# Patient Record
Sex: Male | Born: 1965 | Race: Black or African American | Hispanic: No | Marital: Single | Smoking: Current every day smoker
Health system: Southern US, Community
[De-identification: ages and names within clinical notes are randomized; demographics above are authoritative.]

## PROBLEM LIST (undated history)

## (undated) DIAGNOSIS — B1920 Unspecified viral hepatitis C without hepatic coma: Secondary | ICD-10-CM

## (undated) DIAGNOSIS — F1020 Alcohol dependence, uncomplicated: Secondary | ICD-10-CM

## (undated) DIAGNOSIS — K219 Gastro-esophageal reflux disease without esophagitis: Secondary | ICD-10-CM

## (undated) DIAGNOSIS — K279 Peptic ulcer, site unspecified, unspecified as acute or chronic, without hemorrhage or perforation: Secondary | ICD-10-CM

## (undated) DIAGNOSIS — I10 Essential (primary) hypertension: Secondary | ICD-10-CM

## (undated) DIAGNOSIS — N3941 Urge incontinence: Secondary | ICD-10-CM

## (undated) DIAGNOSIS — F329 Major depressive disorder, single episode, unspecified: Secondary | ICD-10-CM

## (undated) DIAGNOSIS — R011 Cardiac murmur, unspecified: Secondary | ICD-10-CM

## (undated) DIAGNOSIS — F32A Depression, unspecified: Secondary | ICD-10-CM

## (undated) DIAGNOSIS — F1910 Other psychoactive substance abuse, uncomplicated: Secondary | ICD-10-CM

## (undated) DIAGNOSIS — F419 Anxiety disorder, unspecified: Secondary | ICD-10-CM

## (undated) HISTORY — DX: Alcohol dependence, uncomplicated: F10.20

## (undated) HISTORY — DX: Urge incontinence: N39.41

## (undated) HISTORY — PX: APPENDECTOMY: SHX54

## (undated) HISTORY — DX: Peptic ulcer, site unspecified, unspecified as acute or chronic, without hemorrhage or perforation: K27.9

## (undated) HISTORY — DX: Anxiety disorder, unspecified: F41.9

## (undated) HISTORY — DX: Depression, unspecified: F32.A

## (undated) HISTORY — DX: Gastro-esophageal reflux disease without esophagitis: K21.9

## (undated) HISTORY — DX: Major depressive disorder, single episode, unspecified: F32.9

## (undated) HISTORY — DX: Cardiac murmur, unspecified: R01.1

## (undated) HISTORY — DX: Other psychoactive substance abuse, uncomplicated: F19.10

## (undated) HISTORY — PX: INGUINAL HERNIA REPAIR: SUR1180

---

## 1998-07-10 ENCOUNTER — Inpatient Hospital Stay (HOSPITAL_COMMUNITY): Admission: EM | Admit: 1998-07-10 | Discharge: 1998-07-14 | Payer: Self-pay | Admitting: Emergency Medicine

## 1998-07-12 ENCOUNTER — Encounter: Payer: Self-pay | Admitting: Internal Medicine

## 2013-08-10 ENCOUNTER — Emergency Department (INDEPENDENT_AMBULATORY_CARE_PROVIDER_SITE_OTHER)
Admission: EM | Admit: 2013-08-10 | Discharge: 2013-08-10 | Disposition: A | Discharge status: Home / Self Care | Payer: No Typology Code available for payment source | Source: Home / Self Care | Attending: Emergency Medicine | Admitting: Emergency Medicine

## 2013-08-10 ENCOUNTER — Encounter (HOSPITAL_COMMUNITY): Payer: Self-pay | Admitting: Emergency Medicine

## 2013-08-10 DIAGNOSIS — R358 Other polyuria: Secondary | ICD-10-CM

## 2013-08-10 DIAGNOSIS — R3589 Other polyuria: Secondary | ICD-10-CM

## 2013-08-10 DIAGNOSIS — M2550 Pain in unspecified joint: Secondary | ICD-10-CM

## 2013-08-10 HISTORY — DX: Unspecified viral hepatitis C without hepatic coma: B19.20

## 2013-08-10 LAB — CBC
HCT: 43.4 % (ref 39.0–52.0)
Hemoglobin: 14.6 g/dL (ref 13.0–17.0)
MCH: 27.4 pg (ref 26.0–34.0)
MCHC: 33.6 g/dL (ref 30.0–36.0)
MCV: 81.4 fL (ref 78.0–100.0)
PLATELETS: 210 10*3/uL (ref 150–400)
RBC: 5.33 MIL/uL (ref 4.22–5.81)
RDW: 13.4 % (ref 11.5–15.5)
WBC: 6.7 10*3/uL (ref 4.0–10.5)

## 2013-08-10 LAB — BASIC METABOLIC PANEL
BUN: 14 mg/dL (ref 6–23)
CALCIUM: 9.3 mg/dL (ref 8.4–10.5)
CO2: 25 mEq/L (ref 19–32)
CREATININE: 1.01 mg/dL (ref 0.50–1.35)
Chloride: 104 mEq/L (ref 96–112)
GFR calc non Af Amer: 85 mL/min — ABNORMAL LOW (ref >90)
Glucose, Bld: 81 mg/dL (ref 70–99)
Potassium: 4.3 mEq/L (ref 3.7–5.3)
Sodium: 141 mEq/L (ref 137–147)

## 2013-08-10 LAB — POCT URINALYSIS DIP (DEVICE)
Bilirubin Urine: NEGATIVE
Glucose, UA: NEGATIVE mg/dL
HGB URINE DIPSTICK: NEGATIVE
Ketones, ur: NEGATIVE mg/dL
Leukocytes, UA: NEGATIVE
Nitrite: NEGATIVE
PROTEIN: NEGATIVE mg/dL
SPECIFIC GRAVITY, URINE: 1.015 (ref 1.005–1.030)
UROBILINOGEN UA: 2 mg/dL — AB (ref 0.0–1.0)
pH: 8.5 — ABNORMAL HIGH (ref 5.0–8.0)

## 2013-08-10 LAB — SEDIMENTATION RATE: Sed Rate: 9 mm/hr (ref 0–16)

## 2013-08-10 LAB — CK: Total CK: 1179 U/L — ABNORMAL HIGH (ref 7–232)

## 2013-08-10 NOTE — ED Provider Notes (Signed)
Medical screening examination/treatment/procedure(s) were performed by a resident physician and as supervising physician I was immediately available for consultation/collaboration.  Philipp Deputy, M.D.  Harden Mo, MD 08/10/13 2223

## 2013-08-10 NOTE — ED Provider Notes (Signed)
CSN: [id number]     Arrival date & time 08/10/13  1725 History   First MD Initiated Contact with Patient 08/10/13 1927     Chief Complaint  Patient presents with  . Generalized Body Aches   (Consider location/radiation/quality/duration/timing/severity/associated sxs/prior Treatment) HPI  Generalized joint aches. Several months duration. Worst in the knees and shoulders. Worse at night than in the mornings. Associated weakness of his thighs. No history of swelling or redness of the joints. No rashes. Accompanied by polyuria of several years duration. No dysuria or flank pain. No fevers, chills, nausea vomiting or weight loss.  Past Medical History  Diagnosis Date  . Hepatitis C    History reviewed. No pertinent past surgical history. No family history on file. History  Substance Use Topics  . Smoking status: Not on file  . Smokeless tobacco: Not on file  . Alcohol Use: Not on file    Review of Systems See history of present illness Allergies  Review of patient's allergies indicates no known allergies.  Home Medications   Prior to Admission medications   Medication Sig Start Date End Date Taking? Authorizing Provider  esomeprazole (NEXIUM) 40 MG capsule Take 40 mg by mouth daily at 12 noon.   Yes Historical Provider, MD  lurasidone (LATUDA) 40 MG TABS tablet Take 40 mg by mouth daily with breakfast.   Yes Historical Provider, MD   BP 127/68  Pulse 76  Temp(Src) 98.1 F (36.7 C) (Oral)  SpO2 100% Physical Exam Gen.: middle-aged male, non distressed on appearing, pleasant Muscle skeletal: no tenderness or swelling of his knees or shoulders bilaterally, normal range of motion; posturing of upper lumbar shortness Neurologic: 2+ symmetric deep tendon reflexes of patella and Achilles bilaterally Skin: dryness of left palm ED Course  Procedures (including critical care time) Labs Review Labs Reviewed  POCT URINALYSIS DIP (DEVICE) - Abnormal; Notable for the following:    pH  8.5 (*)    Urobilinogen, UA 2.0 (*)    All other components within normal limits  CBC  BASIC METABOLIC PANEL  CK  SEDIMENTATION RATE    Imaging Review No results found.   MDM   1. Polyarthralgia   2. Polyuria    Polyarthralgia - unclear etiology but story consistent with osteoarthritis, nonetheless will screen for CK and ESR to rule out polymyalgia rheumatica  Polyruria - family history of diabetes. Therefore we'll check his glucose.   Given instructions for follow up with community [facility] wellness clinic.     Angelica Ran, MD 08/10/13 [phone]

## 2013-08-10 NOTE — Discharge Instructions (Signed)
Mr. Ethan [name],   It was nice to meet you. We will let you know if any of the tests are abnormal. I would like you to schedule a visit with the community health and wellness clinic as soon as you can.   Take Aleve for the joint pain at this time.   Take Care,   Dr. Maricela Bo

## 2013-08-10 NOTE — ED Notes (Signed)
Pt c/o generalized BA and freq urination Denies hematuria, dysuria Hx of OAB; was taking oxybutynin; released from prison 10 months ago Alert w/no signs of acute distress.

## 2013-08-12 ENCOUNTER — Telehealth: Payer: Self-pay | Admitting: Family Medicine

## 2013-08-12 NOTE — Telephone Encounter (Signed)
I left a message for the patient stating that he has an abnormal lab that needs to be followed up on. I would like to schedule him for a visit with me at [facility]. I asked him to please call back at [phone].

## 2013-08-12 NOTE — ED Notes (Signed)
CK total 1179 H, sed rate 9.  4/29 Message to Dr. Maricela Bo.  4/30 He wrote that he called pt. and left a message.  He will try to gt pt. to f/u in the [facility] clinic. 08/12/2013

## 2013-08-16 ENCOUNTER — Encounter: Payer: Self-pay | Admitting: Family Medicine

## 2013-08-16 NOTE — Telephone Encounter (Signed)
I attempted to call the patient and his wife at the numbers listed in our system. This is the second time without reaching the patient, so I will mail a letter with the results to the patient and ask that he follow up in clinic.

## 2013-09-01 ENCOUNTER — Other Ambulatory Visit: Payer: Self-pay | Admitting: Family Medicine

## 2013-09-01 ENCOUNTER — Ambulatory Visit (INDEPENDENT_AMBULATORY_CARE_PROVIDER_SITE_OTHER): Payer: No Typology Code available for payment source | Admitting: Family Medicine

## 2013-09-01 ENCOUNTER — Encounter: Payer: Self-pay | Admitting: Family Medicine

## 2013-09-01 VITALS — BP 112/83 | HR 90 | Temp 98.0°F | Resp 20 | Ht 69.0 in | Wt 200.0 lb

## 2013-09-01 DIAGNOSIS — R5381 Other malaise: Secondary | ICD-10-CM

## 2013-09-01 DIAGNOSIS — R631 Polydipsia: Secondary | ICD-10-CM

## 2013-09-01 DIAGNOSIS — M2550 Pain in unspecified joint: Secondary | ICD-10-CM

## 2013-09-01 DIAGNOSIS — R632 Polyphagia: Secondary | ICD-10-CM

## 2013-09-01 DIAGNOSIS — M549 Dorsalgia, unspecified: Secondary | ICD-10-CM

## 2013-09-01 DIAGNOSIS — R5383 Other fatigue: Principal | ICD-10-CM

## 2013-09-01 DIAGNOSIS — Z8619 Personal history of other infectious and parasitic diseases: Secondary | ICD-10-CM | POA: Insufficient documentation

## 2013-09-01 DIAGNOSIS — R358 Other polyuria: Secondary | ICD-10-CM

## 2013-09-01 DIAGNOSIS — R3589 Other polyuria: Secondary | ICD-10-CM

## 2013-09-01 DIAGNOSIS — B1920 Unspecified viral hepatitis C without hepatic coma: Secondary | ICD-10-CM

## 2013-09-01 MED ORDER — MELOXICAM 7.5 MG PO TABS: 7.5000 mg | ORAL_TABLET | Freq: Every day | ORAL | Status: DC

## 2013-09-01 NOTE — Progress Notes (Signed)
   Subjective:    Patient ID: Ethan [name], male    DOB: Sep 01, 1965, 48 y.o.   MRN: [id number]  HPI Patient in office to establish care.  Patient complaining of generalized joint pain. Reports that pain is primarily in his upper extremities. Pain intensity is described as 2/10, intermittent, and aching. Patient reports that pain primarily occurs upon awakening. Pain typically improves as the day progresses.  Review of Systems  Constitutional: Positive for fatigue.  HENT: Positive for dental problem.   Eyes: Negative.   Musculoskeletal: Positive for joint swelling and myalgias.  Allergic/Immunologic: Negative.   Neurological: Positive for numbness (occasionally).  Psychiatric/Behavioral: The patient is nervous/anxious.        Objective:   Physical Exam  Constitutional: He appears well-developed and well-nourished. He appears lethargic.  HENT:  Head: Normocephalic.  Right Ear: Hearing, tympanic membrane and external ear normal.  Left Ear: Hearing and tympanic membrane normal.  Neck: Normal range of motion and full passive range of motion without pain. Neck supple.  Cardiovascular: Normal rate, regular rhythm, normal heart sounds and normal pulses.   Pulmonary/Chest: No apnea. No respiratory distress.  Abdominal: Soft. Normal appearance.  Musculoskeletal:       Right shoulder: Normal.  Lymphadenopathy:       Head (right side): No submental and no submandibular adenopathy present.       Head (left side): No submental and no submandibular adenopathy present.       Right cervical: No superficial cervical adenopathy present. Neurological: He appears lethargic.          Assessment & Plan:  1. Bipolar and depression: Patient is followed by [facility] and goes to monthly counseling. Next appt is on June 16th.  Patient reports that bipolar disease and depression are controlled on Latuda. Patient denies suicidal or homicidal ideations.   2. Polyarthragia: Will check an arthritis  panel. Will start Meloxicam 15 mg daily.   3. Hepatitis C: States that he went through 18 months of treatment. Patient was unable to recall the treatment he received. Will review medical records from [facility] as they become available  4. Numbness and tingling: Patient reports occasional numbness and tingling to upper extremities. Will check HbA1C.   5. Immunizations: Patient states that he is up to date on vaccinations. He received routine vaccinations at correctional facility   6. Colonoscopy: Patient reports that he has never had a colonoscopy. Will refer to GI 7. Fatigue: Will check HgbA1C 8. Polyuria: Continue Ditropan. Will check HbA1c.   RTC: 1 month Labs: Arthritis panel, HbA1C  Dorena Dew, FNP

## 2013-09-02 LAB — ARTHRITIS PANEL
Anti Nuclear Antibody(ANA): NEGATIVE
Rhuematoid fact SerPl-aCnc: <10 IU/mL (ref <=14)
Sed Rate: 1 mm/hr (ref 0–16)
Uric Acid, Serum: 7.6 mg/dL (ref 4.0–7.8)

## 2013-09-02 LAB — HEMOGLOBIN A1C
Hgb A1c MFr Bld: 6 % — ABNORMAL HIGH (ref <5.7)
MEAN PLASMA GLUCOSE: 126 mg/dL — AB (ref <117)

## 2013-10-04 ENCOUNTER — Ambulatory Visit: Payer: No Typology Code available for payment source | Admitting: Internal Medicine

## 2013-12-31 ENCOUNTER — Emergency Department (HOSPITAL_COMMUNITY): Payer: Self-pay

## 2013-12-31 ENCOUNTER — Encounter (HOSPITAL_COMMUNITY): Payer: Self-pay | Admitting: Emergency Medicine

## 2013-12-31 ENCOUNTER — Emergency Department (HOSPITAL_COMMUNITY)
Admission: EM | Admit: 2013-12-31 | Discharge: 2014-01-01 | Discharge status: Against Medical Advice | Payer: Self-pay | Attending: Emergency Medicine | Admitting: Emergency Medicine

## 2013-12-31 DIAGNOSIS — R079 Chest pain, unspecified: Secondary | ICD-10-CM | POA: Insufficient documentation

## 2013-12-31 DIAGNOSIS — I10 Essential (primary) hypertension: Secondary | ICD-10-CM | POA: Insufficient documentation

## 2013-12-31 HISTORY — DX: Essential (primary) hypertension: I10

## 2013-12-31 LAB — CBC
HCT: 43.8 % (ref 39.0–52.0)
HEMOGLOBIN: 14.7 g/dL (ref 13.0–17.0)
MCH: 27.7 pg (ref 26.0–34.0)
MCHC: 33.6 g/dL (ref 30.0–36.0)
MCV: 82.5 fL (ref 78.0–100.0)
Platelets: 196 10*3/uL (ref 150–400)
RBC: 5.31 MIL/uL (ref 4.22–5.81)
RDW: 13.6 % (ref 11.5–15.5)
WBC: 5.8 10*3/uL (ref 4.0–10.5)

## 2013-12-31 LAB — BASIC METABOLIC PANEL
Anion gap: 11 (ref 5–15)
BUN: 15 mg/dL (ref 6–23)
CHLORIDE: 106 meq/L (ref 96–112)
CO2: 25 mEq/L (ref 19–32)
Calcium: 9.2 mg/dL (ref 8.4–10.5)
Creatinine, Ser: 1.15 mg/dL (ref 0.50–1.35)
GFR calc non Af Amer: 73 mL/min — ABNORMAL LOW (ref >90)
GFR, EST AFRICAN AMERICAN: 84 mL/min — AB (ref >90)
GLUCOSE: 84 mg/dL (ref 70–99)
POTASSIUM: 4.5 meq/L (ref 3.7–5.3)
Sodium: 142 mEq/L (ref 137–147)

## 2013-12-31 LAB — I-STAT TROPONIN, ED: TROPONIN I, POC: 0 ng/mL (ref 0.00–0.08)

## 2013-12-31 NOTE — ED Notes (Signed)
Per pt sts tightness in chest more when moving and strenous activity. sts that he has been under a lot of stress lately. Pt sent here by EMS, EKG normal and vitals WNL. IV RAC

## 2013-12-31 NOTE — ED Notes (Signed)
Pt sts he wants to leave. Told pt we had a bed for him right this minute and still wanted to leave.

## 2014-11-11 ENCOUNTER — Emergency Department (HOSPITAL_COMMUNITY)
Admission: EM | Admit: 2014-11-11 | Discharge: 2014-11-11 | Disposition: A | Discharge status: Home / Self Care | Payer: Self-pay | Attending: Emergency Medicine | Admitting: Emergency Medicine

## 2014-11-11 ENCOUNTER — Encounter (HOSPITAL_COMMUNITY): Payer: Self-pay | Admitting: Emergency Medicine

## 2014-11-11 DIAGNOSIS — Z79899 Other long term (current) drug therapy: Secondary | ICD-10-CM | POA: Insufficient documentation

## 2014-11-11 DIAGNOSIS — Z8659 Personal history of other mental and behavioral disorders: Secondary | ICD-10-CM | POA: Insufficient documentation

## 2014-11-11 DIAGNOSIS — R443 Hallucinations, unspecified: Secondary | ICD-10-CM | POA: Insufficient documentation

## 2014-11-11 DIAGNOSIS — I10 Essential (primary) hypertension: Secondary | ICD-10-CM | POA: Insufficient documentation

## 2014-11-11 DIAGNOSIS — Z8619 Personal history of other infectious and parasitic diseases: Secondary | ICD-10-CM | POA: Insufficient documentation

## 2014-11-11 LAB — RAPID URINE DRUG SCREEN, HOSP PERFORMED
Amphetamines: NOT DETECTED
BARBITURATES: NOT DETECTED
Benzodiazepines: NOT DETECTED
Cocaine: NOT DETECTED
Opiates: NOT DETECTED
Tetrahydrocannabinol: NOT DETECTED

## 2014-11-11 LAB — SALICYLATE LEVEL

## 2014-11-11 LAB — COMPREHENSIVE METABOLIC PANEL
ALBUMIN: 3.6 g/dL (ref 3.5–5.0)
ALT: 26 U/L (ref 17–63)
AST: 27 U/L (ref 15–41)
Alkaline Phosphatase: 60 U/L (ref 38–126)
Anion gap: 5 (ref 5–15)
BILIRUBIN TOTAL: 0.5 mg/dL (ref 0.3–1.2)
BUN: 11 mg/dL (ref 6–20)
CO2: 28 mmol/L (ref 22–32)
CREATININE: 1.18 mg/dL (ref 0.61–1.24)
Calcium: 9 mg/dL (ref 8.9–10.3)
Chloride: 106 mmol/L (ref 101–111)
GFR calc non Af Amer: >60 mL/min (ref >60)
GLUCOSE: 100 mg/dL — AB (ref 65–99)
Potassium: 4.5 mmol/L (ref 3.5–5.1)
Sodium: 139 mmol/L (ref 135–145)
Total Protein: 6.8 g/dL (ref 6.5–8.1)

## 2014-11-11 LAB — TSH: TSH: 1.098 u[IU]/mL (ref 0.350–4.500)

## 2014-11-11 LAB — LIPID PANEL
CHOL/HDL RATIO: 2.4 ratio
CHOLESTEROL: 146 mg/dL (ref 0–200)
HDL: 60 mg/dL (ref >40)
LDL Cholesterol: 77 mg/dL (ref 0–99)
Triglycerides: 47 mg/dL (ref <150)
VLDL: 9 mg/dL (ref 0–40)

## 2014-11-11 LAB — CBC
HCT: 45.8 % (ref 39.0–52.0)
HEMOGLOBIN: 15 g/dL (ref 13.0–17.0)
MCH: 27.6 pg (ref 26.0–34.0)
MCHC: 32.8 g/dL (ref 30.0–36.0)
MCV: 84.3 fL (ref 78.0–100.0)
PLATELETS: 197 10*3/uL (ref 150–400)
RBC: 5.43 MIL/uL (ref 4.22–5.81)
RDW: 13.5 % (ref 11.5–15.5)
WBC: 4.3 10*3/uL (ref 4.0–10.5)

## 2014-11-11 LAB — ACETAMINOPHEN LEVEL: Acetaminophen (Tylenol), Serum: <10 ug/mL — ABNORMAL LOW (ref 10–30)

## 2014-11-11 LAB — ETHANOL

## 2014-11-11 NOTE — ED Provider Notes (Signed)
CSN: [id number]     Arrival date & time 11/11/14  0962 History   First MD Initiated Contact with Patient 11/11/14 [phone]     Chief Complaint  Patient presents with  . Medical Clearance     HPI  Patient presents from [facility] for medical evaluation. Patient is under involuntary commitment papers due to schizophrenia, PTSD, hallucination. Patient denies medical concern, substantial physical discomfort. Patient acknowledges history of depression, auditory hallucination. Patient denies medication changes. Patient states "I'll be all right".  He denies acute changes from baseline.     Past Medical History  Diagnosis Date  . Hepatitis C   . Hypertension    History reviewed. No pertinent past surgical history. History reviewed. No pertinent family history. History  Substance Use Topics  . Smoking status: Never Smoker   . Smokeless tobacco: Not on file  . Alcohol Use: No    Review of Systems  Constitutional: Negative for fever.  Respiratory: Negative for shortness of breath.   Cardiovascular: Negative for chest pain.  Gastrointestinal: Negative for nausea.  Genitourinary: Negative.   Musculoskeletal:       Negative aside from HPI  Skin: Negative for wound.  Allergic/Immunologic: Negative for immunocompromised state.  Neurological: Negative for weakness.  Psychiatric/Behavioral: Positive for hallucinations and dysphoric mood.      Allergies  Review of patient's allergies indicates no known allergies.  Home Medications   Prior to Admission medications   Medication Sig Start Date End Date Taking? Authorizing Provider  esomeprazole (NEXIUM) 40 MG capsule Take 40 mg by mouth daily at 12 noon.    Historical Provider, MD  oxybutynin (DITROPAN) 5 MG tablet Take 5 mg by mouth 2 (two) times daily.    Historical Provider, MD   BP 121/80 mmHg  Pulse 72  Temp(Src) 97.8 F (36.6 C) (Oral)  Resp 18  SpO2 100% Physical Exam  Constitutional: He is oriented to person, place, and  time. He appears well-developed. No distress.  HENT:  Head: Normocephalic and atraumatic.  Eyes: Conjunctivae and EOM are normal.  Cardiovascular: Normal rate and regular rhythm.   Pulmonary/Chest: Effort normal. No stridor. No respiratory distress.  Abdominal: He exhibits no distension.  Musculoskeletal: He exhibits no edema.  Neurological: He is alert and oriented to person, place, and time.  Skin: Skin is warm and dry.  Psychiatric: He has a normal mood and affect. His speech is normal. He is not agitated, not aggressive, not hyperactive and not combative. Cognition and memory are not impaired.  Not overtly delusional  Nursing note and vitals reviewed.   ED Course  Procedures (including critical care time) Labs Review Labs Reviewed  COMPREHENSIVE METABOLIC PANEL  ETHANOL  SALICYLATE LEVEL  ACETAMINOPHEN LEVEL  CBC  URINE RAPID DRUG SCREEN, HOSP PERFORMED  TSH  HEMOGLOBIN A1C  LIPID PANEL      MDM  Patient presents from our affiliated psychiatric facility for medical evaluation. Here patient is awake, alert, hemodynamically stable, moving all extremities appropriately, interacting pleasantly. Patient is medically cleared for further psychiatric evaluation.  Carmin Muskrat, MD 11/11/14 [phone]

## 2014-11-11 NOTE — ED Notes (Addendum)
Pt arrived from [place] under custody with a need for medical clearance.  Pt is under IVC paperwork.  Paperwork states that pt is diagnosed with schizophrenia PTSD, alcohol dependence and cocaine dependence.  Pt states to IVC committers that he has depression with auditory hallucinations commanding him to kill himself.  Pt does have a bed at [facility] and pending medical clearance is to return to [place].

## 2014-11-12 LAB — HEMOGLOBIN A1C
Hgb A1c MFr Bld: 5.9 % — ABNORMAL HIGH (ref 4.8–5.6)
MEAN PLASMA GLUCOSE: 123 mg/dL

## 2014-12-21 ENCOUNTER — Inpatient Hospital Stay
Admission: EM | Admit: 2014-12-21 | Discharge: 2014-12-27 | DRG: 885 | Disposition: A | Discharge status: Home / Self Care | Payer: No Typology Code available for payment source | Source: Other Acute Inpatient Hospital | Attending: Psychiatry | Admitting: Psychiatry

## 2014-12-21 DIAGNOSIS — R45851 Suicidal ideations: Secondary | ICD-10-CM | POA: Diagnosis present

## 2014-12-21 DIAGNOSIS — Z8711 Personal history of peptic ulcer disease: Secondary | ICD-10-CM

## 2014-12-21 DIAGNOSIS — Z590 Homelessness: Secondary | ICD-10-CM

## 2014-12-21 DIAGNOSIS — L02214 Cutaneous abscess of groin: Secondary | ICD-10-CM | POA: Diagnosis present

## 2014-12-21 DIAGNOSIS — F17210 Nicotine dependence, cigarettes, uncomplicated: Secondary | ICD-10-CM | POA: Diagnosis present

## 2014-12-21 DIAGNOSIS — F1220 Cannabis dependence, uncomplicated: Secondary | ICD-10-CM | POA: Diagnosis present

## 2014-12-21 DIAGNOSIS — F1490 Cocaine use, unspecified, uncomplicated: Secondary | ICD-10-CM | POA: Diagnosis present

## 2014-12-21 DIAGNOSIS — R443 Hallucinations, unspecified: Secondary | ICD-10-CM | POA: Diagnosis present

## 2014-12-21 DIAGNOSIS — B1920 Unspecified viral hepatitis C without hepatic coma: Secondary | ICD-10-CM | POA: Diagnosis present

## 2014-12-21 DIAGNOSIS — R011 Cardiac murmur, unspecified: Secondary | ICD-10-CM | POA: Diagnosis present

## 2014-12-21 DIAGNOSIS — N3281 Overactive bladder: Secondary | ICD-10-CM | POA: Diagnosis present

## 2014-12-21 DIAGNOSIS — K219 Gastro-esophageal reflux disease without esophagitis: Secondary | ICD-10-CM | POA: Diagnosis present

## 2014-12-21 DIAGNOSIS — R45850 Homicidal ideations: Secondary | ICD-10-CM | POA: Diagnosis present

## 2014-12-21 DIAGNOSIS — F602 Antisocial personality disorder: Secondary | ICD-10-CM | POA: Diagnosis present

## 2014-12-21 DIAGNOSIS — Z91410 Personal history of adult physical and sexual abuse: Secondary | ICD-10-CM | POA: Diagnosis not present

## 2014-12-21 DIAGNOSIS — F331 Major depressive disorder, recurrent, moderate: Principal | ICD-10-CM | POA: Diagnosis present

## 2014-12-21 DIAGNOSIS — Z8619 Personal history of other infectious and parasitic diseases: Secondary | ICD-10-CM | POA: Diagnosis present

## 2014-12-21 DIAGNOSIS — I10 Essential (primary) hypertension: Secondary | ICD-10-CM | POA: Diagnosis present

## 2014-12-21 DIAGNOSIS — G4700 Insomnia, unspecified: Secondary | ICD-10-CM | POA: Diagnosis present

## 2014-12-21 DIAGNOSIS — F1010 Alcohol abuse, uncomplicated: Secondary | ICD-10-CM | POA: Diagnosis present

## 2014-12-21 DIAGNOSIS — F259 Schizoaffective disorder, unspecified: Secondary | ICD-10-CM | POA: Diagnosis present

## 2014-12-21 DIAGNOSIS — N492 Inflammatory disorders of scrotum: Secondary | ICD-10-CM

## 2014-12-21 DIAGNOSIS — F17200 Nicotine dependence, unspecified, uncomplicated: Secondary | ICD-10-CM

## 2014-12-21 DIAGNOSIS — F1590 Other stimulant use, unspecified, uncomplicated: Secondary | ICD-10-CM

## 2014-12-21 MED ORDER — TRAZODONE HCL 100 MG PO TABS: 100.0000 mg | ORAL_TABLET | Freq: Every evening | ORAL | Status: DC | PRN

## 2014-12-21 MED ORDER — ACETAMINOPHEN 325 MG PO TABS: 650.0000 mg | ORAL_TABLET | Freq: Four times a day (QID) | ORAL | Status: DC | PRN

## 2014-12-21 MED ORDER — OLANZAPINE 10 MG PO TABS
5.0000 mg | ORAL_TABLET | Freq: Every day | ORAL | Status: DC
  Administered 2014-12-21: 5 mg via ORAL
  Filled 2014-12-21: qty 1

## 2014-12-21 MED ORDER — NICOTINE POLACRILEX 2 MG MT GUM: 2.0000 mg | CHEWING_GUM | OROMUCOSAL | Status: DC | PRN

## 2014-12-21 MED ORDER — MAGNESIUM HYDROXIDE 400 MG/5ML PO SUSP: 30.0000 mL | Freq: Every day | ORAL | Status: DC | PRN

## 2014-12-21 MED ORDER — DIVALPROEX SODIUM 500 MG PO DR TAB
500.0000 mg | DELAYED_RELEASE_TABLET | Freq: Every day | ORAL | Status: DC
  Administered 2014-12-21: 500 mg via ORAL
  Filled 2014-12-21: qty 1

## 2014-12-21 MED ORDER — PANTOPRAZOLE SODIUM 40 MG PO TBEC
40.0000 mg | DELAYED_RELEASE_TABLET | Freq: Every day | ORAL | Status: DC
  Administered 2014-12-21 – 2014-12-27 (×7): 40 mg via ORAL
  Filled 2014-12-21 (×7): qty 1

## 2014-12-21 MED ORDER — ALUM & MAG HYDROXIDE-SIMETH 200-200-20 MG/5ML PO SUSP: 30.0000 mL | ORAL | Status: DC | PRN

## 2014-12-22 DIAGNOSIS — F259 Schizoaffective disorder, unspecified: Secondary | ICD-10-CM

## 2014-12-22 DIAGNOSIS — F1590 Other stimulant use, unspecified, uncomplicated: Secondary | ICD-10-CM

## 2014-12-22 DIAGNOSIS — F602 Antisocial personality disorder: Secondary | ICD-10-CM

## 2014-12-22 DIAGNOSIS — F1220 Cannabis dependence, uncomplicated: Secondary | ICD-10-CM

## 2014-12-22 DIAGNOSIS — I10 Essential (primary) hypertension: Secondary | ICD-10-CM

## 2014-12-22 DIAGNOSIS — K219 Gastro-esophageal reflux disease without esophagitis: Secondary | ICD-10-CM

## 2014-12-22 DIAGNOSIS — F17200 Nicotine dependence, unspecified, uncomplicated: Secondary | ICD-10-CM

## 2014-12-22 DIAGNOSIS — F331 Major depressive disorder, recurrent, moderate: Principal | ICD-10-CM

## 2014-12-22 DIAGNOSIS — F1010 Alcohol abuse, uncomplicated: Secondary | ICD-10-CM

## 2014-12-22 LAB — COMPREHENSIVE METABOLIC PANEL
ALK PHOS: 64 U/L (ref 38–126)
ALT: 20 U/L (ref 17–63)
AST: 28 U/L (ref 15–41)
Albumin: 3.3 g/dL — ABNORMAL LOW (ref 3.5–5.0)
Anion gap: 8 (ref 5–15)
BUN: 11 mg/dL (ref 6–20)
CO2: 25 mmol/L (ref 22–32)
CREATININE: 1.25 mg/dL — AB (ref 0.61–1.24)
Calcium: 9.1 mg/dL (ref 8.9–10.3)
Chloride: 107 mmol/L (ref 101–111)
GFR calc Af Amer: >60 mL/min (ref >60)
Glucose, Bld: 82 mg/dL (ref 65–99)
Potassium: 4.5 mmol/L (ref 3.5–5.1)
Sodium: 140 mmol/L (ref 135–145)
Total Bilirubin: 0.3 mg/dL (ref 0.3–1.2)
Total Protein: 7.4 g/dL (ref 6.5–8.1)

## 2014-12-22 LAB — URINALYSIS COMPLETE WITH MICROSCOPIC (ARMC ONLY)
Bacteria, UA: NONE SEEN
Bilirubin Urine: NEGATIVE
Glucose, UA: NEGATIVE mg/dL
HGB URINE DIPSTICK: NEGATIVE
KETONES UR: NEGATIVE mg/dL
LEUKOCYTES UA: NEGATIVE
NITRITE: NEGATIVE
Protein, ur: NEGATIVE mg/dL
SPECIFIC GRAVITY, URINE: 1.011 (ref 1.005–1.030)
Squamous Epithelial / LPF: NONE SEEN
pH: 5 (ref 5.0–8.0)

## 2014-12-22 LAB — TSH: TSH: 0.927 u[IU]/mL (ref 0.350–4.500)

## 2014-12-22 LAB — LIPID PANEL
CHOLESTEROL: 137 mg/dL (ref 0–200)
HDL: 36 mg/dL — ABNORMAL LOW (ref >40)
LDL Cholesterol: 93 mg/dL (ref 0–99)
Total CHOL/HDL Ratio: 3.8 RATIO
Triglycerides: 38 mg/dL (ref <150)
VLDL: 8 mg/dL (ref 0–40)

## 2014-12-22 LAB — CBC
HCT: 45.4 % (ref 40.0–52.0)
HEMOGLOBIN: 14.8 g/dL (ref 13.0–18.0)
MCH: 27.1 pg (ref 26.0–34.0)
MCHC: 32.6 g/dL (ref 32.0–36.0)
MCV: 83 fL (ref 80.0–100.0)
PLATELETS: 293 10*3/uL (ref 150–440)
RBC: 5.47 MIL/uL (ref 4.40–5.90)
RDW: 13 % (ref 11.5–14.5)
WBC: 5.8 10*3/uL (ref 3.8–10.6)

## 2014-12-22 LAB — URINE DRUG SCREEN, QUALITATIVE (ARMC ONLY)
Amphetamines, Ur Screen: NOT DETECTED
BARBITURATES, UR SCREEN: NOT DETECTED
Benzodiazepine, Ur Scrn: NOT DETECTED
CANNABINOID 50 NG, UR ~~LOC~~: NOT DETECTED
Cocaine Metabolite,Ur ~~LOC~~: NOT DETECTED
MDMA (ECSTASY) UR SCREEN: NOT DETECTED
Methadone Scn, Ur: NOT DETECTED
Opiate, Ur Screen: NOT DETECTED
Phencyclidine (PCP) Ur S: NOT DETECTED
Tricyclic, Ur Screen: NOT DETECTED

## 2014-12-22 LAB — HEMOGLOBIN A1C: HEMOGLOBIN A1C: 6.1 % — AB (ref 4.0–6.0)

## 2014-12-22 LAB — ETHANOL: Alcohol, Ethyl (B): <5 mg/dL (ref <5)

## 2014-12-22 MED ORDER — HYDROXYZINE HCL 50 MG PO TABS
50.0000 mg | ORAL_TABLET | Freq: Four times a day (QID) | ORAL | Status: DC | PRN
  Administered 2014-12-25 – 2014-12-27 (×3): 50 mg via ORAL
  Filled 2014-12-22 (×3): qty 1

## 2014-12-22 MED ORDER — SERTRALINE HCL 25 MG PO TABS
25.0000 mg | ORAL_TABLET | Freq: Every day | ORAL | Status: DC
  Administered 2014-12-22 – 2014-12-25 (×4): 25 mg via ORAL
  Filled 2014-12-22 (×4): qty 1

## 2014-12-22 MED ORDER — NICOTINE 14 MG/24HR TD PT24
14.0000 mg | MEDICATED_PATCH | Freq: Every day | TRANSDERMAL | Status: DC
  Filled 2014-12-22 (×5): qty 1

## 2014-12-22 NOTE — BH Assessment (Addendum)
Assessment Note  Ethan [name] is an 49 y.o. male who presents to [facility] after being referred by [facility]. He is currently being followed by them on an outpatient basis. Patient is reporting SI and HI with no plan. Patient is also stating, he having an increase of A/H and they are commanding in nature. Patient is stating he cannot contract for safety and is a threat to himself.  Axis I: Substance Abuse and Schizophrenia Axis III:  Past Medical History  Diagnosis Date  . Hepatitis C   . Hypertension    Axis IV: economic problems, other psychosocial or environmental problems, problems related to social environment, problems with access to health care services and problems with primary support group  Past Medical History:  Past Medical History  Diagnosis Date  . Hepatitis C   . Hypertension     History reviewed. No pertinent past surgical history.  Family History: History reviewed. No pertinent family history.  Social History:  reports that he has been smoking Cigarettes.  He does not have any smokeless tobacco history on file. He reports that he uses illicit drugs ("Crack" cocaine). He reports that he does not drink alcohol.  Additional Social History:  Alcohol / Drug Use Pain Medications: None Reported Prescriptions: None Reported Over the Counter: None Reported History of alcohol / drug use?: Yes Longest period of sobriety (when/how long): Unknown Negative Consequences of Use: Personal relationships Withdrawal Symptoms:  (None Reported) Substance #1 Name of Substance 1: Cocaine Substance #2 Name of Substance 2: THC  CIWA: CIWA-Ar BP: 115/80 mmHg Pulse Rate: 69 COWS:    Allergies: No Known Allergies  Home Medications:  Medications Prior to Admission  Medication Sig Dispense Refill  . lurasidone (LATUDA) 40 MG TABS tablet Take 40 mg by mouth daily with breakfast.    . traZODone (DESYREL) 50 MG tablet Take 50 mg by mouth at bedtime.        OB/GYN Status:  No LMP for male patient.  General Assessment Data Location of Assessment: [facility] ED TTS Assessment: In system Is this a Tele or Face-to-Face Assessment?: Tele Assessment Is this an Initial Assessment or a Re-assessment for this encounter?: Initial Assessment Marital status: Single Maiden name: n/a Is patient pregnant?: No Pregnancy Status: No Living Arrangements: Spouse/significant other Can pt return to current living arrangement?: Yes Admission Status: Involuntary Is patient capable of signing voluntary admission?: No Referral Source: Other Consulting civil engineer) Insurance type: Environmental health practitioner Exam ([place]) Medical Exam completed: Yes  Crisis Care Plan Living Arrangements: Spouse/significant other Name of Psychiatrist: [facility] Name of Therapist: [facility] with [facility]  Education Status Is patient currently in school?: No Current Grade: n/a Highest grade of school patient has completed: Unknown Name of school: n/a Contact person: n/a  Risk to self with the past 6 months Suicidal Ideation: Yes-Currently Present Has patient been a risk to self within the past 6 months prior to admission? : Yes Suicidal Intent: Yes-Currently Present Has patient had any suicidal intent within the past 6 months prior to admission? : Yes Is patient at risk for suicide?: Yes Suicidal Plan?: No Has patient had any suicidal plan within the past 6 months prior to admission? : Yes Access to Means: Yes Specify Access to Suicidal Means: Access to medications What has been your use of drugs/alcohol within the last 12 months?: Cocaine & THC Previous Attempts/Gestures: Yes How many times?: 1 Other Self Harm Risks: None Reported Triggers for Past  Attempts: Hallucinations, Other (Comment) Intentional Self Injurious Behavior: None Family Suicide History: Unknown Recent stressful life event(s): Other (Comment), Conflict (Comment) (Off of  medications) Persecutory voices/beliefs?: Yes Depression: Yes Depression Symptoms: Feeling angry/irritable, Loss of interest in usual pleasures, Isolating, Feeling worthless/self pity Substance abuse history and/or treatment for substance abuse?: Yes Suicide prevention information given to non-admitted patients: Not applicable  Risk to Others within the past 6 months Homicidal Ideation: Yes-Currently Present Does patient have any lifetime risk of violence toward others beyond the six months prior to admission? : No Thoughts of Harm to Others: Yes-Currently Present Comment - Thoughts of Harm to Others: No specific plans Current Homicidal Intent: No Current Homicidal Plan: No Access to Homicidal Means: No Identified Victim: No specific person History of harm to others?: No Assessment of Violence: None Noted Violent Behavior Description: None Reported Does patient have access to weapons?: No Criminal Charges Pending?: No Does patient have a court date: No Is patient on probation?: No  Psychosis Hallucinations: Auditory, Visual Delusions: None noted  Mental Status Report Appearance/Hygiene: In scrubs Eye Contact: Unable to Assess Motor Activity: Unable to assess Speech: Logical/coherent Level of Consciousness: Alert Mood: Depressed, Anxious Affect: Anxious Anxiety Level: Minimal Thought Processes: Circumstantial (Per [facility] Report) Judgement: Impaired Orientation: Person, Place, Time, Situation, Appropriate for developmental age Obsessive Compulsive Thoughts/Behaviors: Minimal  Cognitive Functioning Concentration: Normal Memory: Recent Intact, Remote Intact IQ: Average Insight: Poor Impulse Control: Poor Appetite: Fair Weight Loss: 0 Weight Gain: 0 Sleep: Decreased Total Hours of Sleep: 6 Vegetative Symptoms: None  ADLScreening [facility] Assessment Services) Patient's cognitive ability adequate to safely complete daily activities?: Yes Patient able to express need for  assistance with ADLs?: Yes Independently performs ADLs?: Yes (appropriate for developmental age)  Prior Inpatient Therapy Prior Inpatient Therapy: Yes Prior Therapy Dates: Unknown Prior Therapy Facilty/Provider(s): [facility] Reason for Treatment: Schizophrenia  Prior Outpatient Therapy Prior Outpatient Therapy: Yes Prior Therapy Dates: Current Prior Therapy Facilty/Provider(s): [facility] Reason for Treatment: Schizophrenia Does patient have an ACCT team?: No Does patient have Intensive In-House Services?  : No Does patient have [facility] services? : Yes Does patient have P4CC services?: No  ADL Screening (condition at time of admission) Patient's cognitive ability adequate to safely complete daily activities?: Yes Is the patient deaf or have difficulty hearing?: No Does the patient have difficulty seeing, even when wearing glasses/contacts?: No Does the patient have difficulty concentrating, remembering, or making decisions?: No Patient able to express need for assistance with ADLs?: Yes Does the patient have difficulty dressing or bathing?: No Independently performs ADLs?: Yes (appropriate for developmental age) Does the patient have difficulty walking or climbing stairs?: No Weakness of Legs: None Weakness of Arms/Hands: None  Home Assistive Devices/Equipment Home Assistive Devices/Equipment: None  Therapy Consults (therapy consults require a physician order) PT Evaluation Needed: No OT Evalulation Needed: No SLP Evaluation Needed: No Abuse/Neglect Assessment (Assessment to be complete while patient is alone) Physical Abuse: Denies Verbal Abuse: Denies Sexual Abuse: Denies Exploitation of patient/patient's resources: Denies Self-Neglect: Denies Values / Beliefs Cultural Requests During Hospitalization: None Spiritual Requests During Hospitalization: None Consults Spiritual Care Consult Needed: No Social Work Consult Needed: No Regulatory affairs officer (For Healthcare) Does  patient have an advance directive?: No Would patient like information on creating an advanced directive?: No - patient declined information Nutrition Screen- [facility] Adult/WL/AP Patient's home diet: Cardiac Has the patient recently lost weight without trying?: No Has the patient been eating poorly because of a decreased appetite?: No Malnutrition Screening Tool Score: 0  Additional Information 1:1 In Past 12 Months?: No CIRT Risk: No Elopement Risk: No Does patient have medical clearance?: Yes  Child/Adolescent Assessment Running Away Risk: Denies (Patient is an adult)  Disposition:  Disposition Initial Assessment Completed for this Encounter: Yes Disposition of Patient: Inpatient treatment program Type of inpatient treatment program: Adult Other disposition(s): Referred to outside facility (Referred to [facility] from [facility])  On Site Evaluation by:   Reviewed with Physician:    Gunnar Fusi, MS, LCAS, LPC, [place], CCSI 12/22/2014 5:50 PM

## 2014-12-22 NOTE — BHH Group Notes (Signed)
[facility] LCSW Group Therapy  12/22/2014 4:20 PM  Type of Therapy:  Group Therapy  Participation Level:  Minimal  Participation Quality:  Attentive  Affect:  Flat  Cognitive:  Alert  Insight:  Limited   Engagement in Therapy:  Limited   Modes of Intervention:  Discussion, Education, Socialization and Support  Summary of Progress/Problems: Balance in life: Patients will discuss the concept of balance and how it looks and feels to be unbalanced. Pt will identify areas in their life that is unbalanced and ways to become more balanced.  Ethan [name] attended group and stayed the entire time. He sat quietly and listened to other group members.   [place] MSW, [place]  12/22/2014, 4:20 PM

## 2014-12-22 NOTE — BHH Group Notes (Signed)
[facility] Group Notes:  (Nursing/MHT/Case Management/Adjunct)  Date:  12/22/2014  Time:  2:21 PM  Type of Therapy:  Group Therapy  Participation Level:  Active  Participation Quality:  Appropriate  Affect:  Appropriate  Cognitive:  Appropriate  Insight:  Good  Engagement in Group:  Engaged  Modes of Intervention:  Support  Summary of Progress/Problems:  Nehemiah Settle 12/22/2014, 2:21 PM

## 2014-12-22 NOTE — Progress Notes (Signed)
D: Patient has appeared anxious today. He states that he has a hard time trusting people and is ambivalent about attending groups and talking to the psychiatrist because he feels he has been betrayed in the past. He rates depression as 6/10 and anxiety 5/10. Sleep is fair and energy level is low. States his goal today is to try to not feel like everyone is against him. He wants to find a place to live and stay off of drugs. Denies SI/HI/AVH. A: On q 15 minute checks. Given meds. Offered emotional support. R: Cooperative but guarded and anxious.
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
## 2014-12-22 NOTE — Progress Notes (Signed)
Patient admitted today for SI with no plan.  Patient is also positive for auditory hallucinations telling him to hurt himself and others.  Patient search performed.  No contraband found.

## 2014-12-22 NOTE — Progress Notes (Signed)
Recreation Therapy Notes  Date: 09.08.16 Time: 4:40 pm Location: Craft Room  Group Topic: Leisure Education  Goal Area(s) Addresses:  Patient will identify activities for each letter of the alphabet. Patient will verbalize ability to integrate positive leisure into life post d/c. Patient will verbalize ability to use leisure as a Technical sales engineer.  Behavioral Response: Attentive, Interactive  Intervention: Leisure Alphabet  Activity: Patients were given a Leisure Air traffic controller and instructed to write a healthy leisure activity for each letter of the alphabet.   Education: LRT educated patient on things that are needed to participate in leisure  Education Outcome: In group clarification offered  Clinical Observations/Feedback: Patient completed approximately 90% of worksheet. Patient contributed to group discussion by stating some healthy leisure activities he wrote down.  Leonette Monarch, LRT/CTRS 12/22/2014 4:52 PM
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
## 2014-12-22 NOTE — Progress Notes (Signed)
Recreation Therapy Notes  INPATIENT RECREATION THERAPY ASSESSMENT  Patient Details Name: SAMMUEL BLICK MRN: [id number] DOB: Oct 02, 1965 Today's Date: 12/22/2014  Patient Stressors: Family, Other (Comment) (Losing apartment)  Coping Skills:   Substance Abuse, Exercise, Art/Dance, Talking, Music, Sports  Personal Challenges: Anger, Communication, Concentration, Decision-Making, Expressing Yourself, Problem-Solving, Relationships, Self-Esteem/Confidence, Social Interaction, Stress Management, Substance Abuse, Time Management, Trusting Others  Leisure Interests (2+):  Individual - Other (Comment) (Work out and help others)  Futures trader Resources:  No  Community Resources:     Current Use:    If no, Barriers?:    Patient Strengths:  Encouraging, open-minded  Patient Identified Areas of Improvement:  self-esteem, drug problem  Current Recreation Participation:  Getting high  Patient Goal for Hospitalization:  To be drug and alcohol free, get self-esteem back, and find a place he can be secure in  [place] of Residence:  [place] of Residence:  [place]   Current SI (including self-harm):  No ("Been doing that (having suicidal thoughts) for years.")  Current HI:  No ("I go through that (thoughts of homicide) too.")  Consent to Intern Participation: N/A   Leonette Monarch, LRT/CTRS 12/22/2014, 5:34 PM

## 2014-12-22 NOTE — Tx Team (Signed)
Initial Interdisciplinary Treatment Plan   PATIENT STRESSORS: Financial difficulties Occupational concerns Substance abuse   PATIENT STRENGTHS: Ability for insight General fund of knowledge Motivation for treatment/growth   PROBLEM LIST: Problem List/Patient Goals Date to be addressed Date deferred Reason deferred Estimated date of resolution  Depression 12/22/14     Suicidal Ideation 12/22/14     Psychosis 12/22/14                                          DISCHARGE CRITERIA:  Improved stabilization in mood, thinking, and/or behavior  PRELIMINARY DISCHARGE PLAN: Outpatient therapy  PATIENT/FAMIILY INVOLVEMENT: This treatment plan has been presented to and reviewed with the patient, Ethan [name], and/or family member.  The patient and family have been given the opportunity to ask questions and make suggestions.  Nash Mantis [facility] 12/22/2014, 6:11 AM

## 2014-12-22 NOTE — [facility] Suicide Risk Assessment (Signed)
[facility] Admission Suicide Risk Assessment   Nursing information obtained from:  Patient Demographic factors:  Male, Low socioeconomic status, Living alone, Unemployed Current Mental Status:  NA Loss Factors:  Financial problems / change in socioeconomic status Historical Factors:  Prior suicide attempts, Family history of mental illness or substance abuse Risk Reduction Factors:  Religious beliefs about death Total Time spent with patient: 30 minutes Principal Problem: Major depressive disorder, recurrent episode, moderate with anxious distress Diagnosis:   Patient Active Problem List   Diagnosis Date Noted  . HTN (hypertension) [I10] 12/22/2014  . GERD (gastroesophageal reflux disease) [K21.9] 12/22/2014  . Tobacco use disorder [Z72.0] 12/22/2014  . 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 Cannabis use disorder, severe, dependence [F12.20] 12/22/2014  . Alcohol use disorder, mild, abuse [F10.10] 12/22/2014  . Major depressive disorder, recurrent episode, moderate with anxious distress [F33.1] 12/22/2014  . Hepatitis C (treated) [B19.20] 09/01/2013     Continued Clinical Symptoms:  Alcohol Use Disorder Identification Test Final Score (AUDIT): 5 The "Alcohol Use Disorders Identification Test", Guidelines for Use in Primary Care, Second Edition.  World Pharmacologist [facility]). Score between 0-7:  no or low risk or alcohol related problems. Score between 8-15:  moderate risk of alcohol related problems. Score between 16-19:  high risk of alcohol related problems. Score 20 or above:  warrants further diagnostic evaluation for alcohol dependence and treatment.   CLINICAL FACTORS:   Depression:   Aggression Comorbid alcohol abuse/dependence Impulsivity Alcohol/Substance Abuse/Dependencies Personality Disorders:   Cluster B Comorbid alcohol abuse/dependence Comorbid depression Previous Psychiatric Diagnoses and Treatments    Psychiatric Specialty Exam: Physical  Exam  ROS   COGNITIVE FEATURES THAT CONTRIBUTE TO RISK:  Closed-mindedness    SUICIDE RISK:   Mild:  Suicidal ideation of limited frequency, intensity, duration, and specificity.  There are no identifiable plans, no associated intent, mild dysphoria and related symptoms, good self-control (both objective and subjective assessment), few other risk factors, and identifiable protective factors, including available and accessible social support.  PLAN OF CARE: admit to [facility]:  Established Problem, Stable/Improving (1)  I certify that inpatient services furnished can reasonably be expected to improve the patient's condition.   Hildred Priest 12/22/2014, 10:58 AM

## 2014-12-23 DIAGNOSIS — N492 Inflammatory disorders of scrotum: Secondary | ICD-10-CM

## 2014-12-23 MED ORDER — CEPHALEXIN 500 MG PO CAPS
500.0000 mg | ORAL_CAPSULE | Freq: Four times a day (QID) | ORAL | Status: DC
  Administered 2014-12-23 – 2014-12-27 (×17): 500 mg via ORAL
  Filled 2014-12-23 (×17): qty 1

## 2014-12-23 NOTE — Progress Notes (Signed)
D: Patient denies SI/HI/AVH.  Patient affect and mood are depressed.  Patient interaction with staff is minimal.  Patient did attend evening group. Patient visible on the milieu. No distress noted. A: Support and encouragement offered. Scheduled medications given to pt. Q 15 min checks continued for patient safety. R: Patient receptive. Patient remains safe on the unit.

## 2014-12-23 NOTE — Progress Notes (Addendum)
[facility] MD Progress Note  12/23/2014 11:54 AM Ethan [name]  MRN:  [id number] Subjective:  Patient states that if he has nowhere to go at discharge he is just going to take some pills to go to sleep and never wake up. He tells me he has nowhere to go and no family.  He had not tried to overdose before because he does not want him put his family through that but feels very frustrated as he has nowhere to go to.  Patient is a registered sex offender (rape charges) per Education officer, museum he is not allowed to go to the homeless shelter.   Today the patient denies major issues with sleep, appetite. Mood appetite and concentrations are poor.  Denies side effects from medications. Denies having any physical complaints.  Denies HI or auditory or visual hallucinations.  Per nursing : D: Patient denies SI/HI/AVH. Patient affect and mood are depressed. Patient interaction with staff is minimal. Patient did attend evening group. Patient visible on the milieu. No distress noted. A: Support and encouragement offered. Scheduled medications given to pt. Q 15 min checks continued for patient safety. R: Patient receptive. Patient remains safe on the unit.   Principal Problem: Major depressive disorder, recurrent episode, moderate with anxious distress Diagnosis:   Patient Active Problem List   Diagnosis Date Noted  . Scrotal abscess [N49.2] 12/23/2014  . HTN (hypertension) [I10] 12/22/2014  . GERD (gastroesophageal reflux disease) [K21.9] 12/22/2014  . Tobacco use disorder [Z72.0] 12/22/2014  . 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 Cannabis use disorder, severe, dependence [F12.20] 12/22/2014  . Alcohol use disorder, mild, abuse [F10.10] 12/22/2014  . Major depressive disorder, recurrent episode, moderate with anxious distress [F33.1] 12/22/2014  . Antisocial personality disorder [F60.2] 12/22/2014  . Hepatitis C (treated) [B19.20] 09/01/2013   Total Time spent with patient: 30 minutes   Past  Medical History:  Past Medical History  Diagnosis Date  . Hepatitis C   . Hypertension    History reviewed. No pertinent past surgical history. Family History: History reviewed. No pertinent family history. Social History:  History  Alcohol Use No     History  Drug Use  . Yes  . Special: "Crack" cocaine    Social History   Social History  . Marital Status: Married    Spouse Name: N/A  . Number of Children: N/A  . Years of Education: N/A   Social History Main Topics  . Smoking status: Current Every Day Smoker    Types: Cigarettes  . Smokeless tobacco: None  . Alcohol Use: No  . Drug Use: Yes    Special: "Crack" cocaine  . Sexual Activity:    Partners: Female     Comment: WIFE   Other Topics Concern  . None   Social History Narrative   Additional History:    Sleep: Good  Appetite:  Fair   Assessment:   Musculoskeletal: Strength & Muscle Tone: within normal limits Gait & Station: normal Patient leans: N/A   Psychiatric Specialty Exam: Physical Exam  Review of Systems  Constitutional: Negative.   HENT: Negative.   Eyes: Negative.   Respiratory: Negative.   Cardiovascular: Negative.   Gastrointestinal: Negative.   Genitourinary: Negative.   Musculoskeletal: Negative.   Skin: Negative.   Neurological: Negative.   Endo/Heme/Allergies: Negative.   Psychiatric/Behavioral: Positive for depression, suicidal ideas and substance abuse.    Blood pressure 112/77, pulse 70, temperature 98.6 F (37 C), temperature source Oral, resp. rate 20, height 5' 9"  (  1.753 m), weight 83.462 kg (184 lb), SpO2 99 %.Body mass index is 27.16 kg/(m^2).  General Appearance: Well Groomed  Engineer, water::  Good  Speech:  Normal Rate  Volume:  Normal  Mood:  Irritable  Affect:  Constricted  Thought Process:  Linear  Orientation:  Full (Time, Place, and Person)  Thought Content:  Hallucinations: None  Suicidal Thoughts:  No  Homicidal Thoughts:  No  Memory:  Immediate;    Good Recent;   Good Remote;   Good  Judgement:  Poor  Insight:  Shallow  Psychomotor Activity:  Normal  Concentration:  NA  Recall:  NA  Fund of Knowledge:Good  Language: Good  Akathisia:  No  Handed:    AIMS (if indicated):     Assets:  Armed forces logistics/support/administrative officer Physical Health  ADL's:  Intact  Cognition: WNL  Sleep:  Number of Hours: 7.25     Current Medications: Current Facility-Administered Medications  Medication Dose Route Frequency Provider Last Rate Last Dose  . acetaminophen (TYLENOL) tablet 650 mg  650 mg Oral Q6H PRN Hildred Priest, MD      . alum & mag hydroxide-simeth (MAALOX/MYLANTA) 200-200-20 MG/5ML suspension 30 mL  30 mL Oral Q4H PRN Hildred Priest, MD      . cephALEXin (KEFLEX) capsule 500 mg  500 mg Oral 4 times per day Sherri Rad, MD   500 mg at 12/23/14 1144  . hydrOXYzine (ATARAX/VISTARIL) tablet 50 mg  50 mg Oral Q6H PRN Hildred Priest, MD      . magnesium hydroxide (MILK OF MAGNESIA) suspension 30 mL  30 mL Oral Daily PRN Hildred Priest, MD      . nicotine (NICODERM CQ - dosed in mg/24 hours) patch 14 mg  14 mg Transdermal Daily Hildred Priest, MD   14 mg at 12/22/14 1456  . pantoprazole (PROTONIX) EC tablet 40 mg  40 mg Oral Daily Hildred Priest, MD   40 mg at 12/23/14 0912  . sertraline (ZOLOFT) tablet 25 mg  25 mg Oral Daily Hildred Priest, MD   25 mg at 12/23/14 0912  . traZODone (DESYREL) tablet 100 mg  100 mg Oral QHS PRN Hildred Priest, MD        Lab Results:  Results for orders placed or performed during the hospital encounter of 12/21/14 (from the past 48 hour(s))  CBC     Status: None   Collection Time: 12/22/14  6:01 AM  Result Value Ref Range   WBC 5.8 3.8 - 10.6 K/uL   RBC 5.47 4.40 - 5.90 MIL/uL   Hemoglobin 14.8 13.0 - 18.0 g/dL   HCT 45.4 40.0 - 52.0 %   MCV 83.0 80.0 - 100.0 fL   MCH 27.1 26.0 - 34.0 pg   MCHC 32.6 32.0 - 36.0 g/dL   RDW 13.0 11.5  - 14.5 %   Platelets 293 150 - 440 K/uL  Comprehensive metabolic panel     Status: Abnormal   Collection Time: 12/22/14  6:01 AM  Result Value Ref Range   Sodium 140 135 - 145 mmol/L   Potassium 4.5 3.5 - 5.1 mmol/L   Chloride 107 101 - 111 mmol/L   CO2 25 22 - 32 mmol/L   Glucose, Bld 82 65 - 99 mg/dL   BUN 11 6 - 20 mg/dL   Creatinine, Ser 1.25 (H) 0.61 - 1.24 mg/dL   Calcium 9.1 8.9 - 10.3 mg/dL   Total Protein 7.4 6.5 - 8.1 g/dL   Albumin 3.3 (L) 3.5 - 5.0  g/dL   AST 28 15 - 41 U/L   ALT 20 17 - 63 U/L   Alkaline Phosphatase 64 38 - 126 U/L   Total Bilirubin 0.3 0.3 - 1.2 mg/dL   GFR calc non Af Amer >60 >60 mL/min   GFR calc Af Amer >60 >60 mL/min    Comment: (NOTE) The eGFR has been calculated using the CKD EPI equation. This calculation has not been validated in all clinical situations. eGFR's persistently <60 mL/min signify possible Chronic Kidney Disease.    Anion gap 8 5 - 15  Hemoglobin A1c     Status: Abnormal   Collection Time: 12/22/14  6:01 AM  Result Value Ref Range   Hgb A1c MFr Bld 6.1 (H) 4.0 - 6.0 %  Ethanol     Status: None   Collection Time: 12/22/14  6:01 AM  Result Value Ref Range   Alcohol, Ethyl (B) <5 <5 mg/dL    Comment:        LOWEST DETECTABLE LIMIT FOR SERUM ALCOHOL IS 5 mg/dL FOR MEDICAL PURPOSES ONLY   Lipid panel, fasting     Status: Abnormal   Collection Time: 12/22/14  6:01 AM  Result Value Ref Range   Cholesterol 137 0 - 200 mg/dL   Triglycerides 38 <150 mg/dL   HDL 36 (L) >40 mg/dL   Total CHOL/HDL Ratio 3.8 RATIO   VLDL 8 0 - 40 mg/dL   LDL Cholesterol 93 0 - 99 mg/dL    Comment:        Total Cholesterol/HDL:CHD Risk Coronary Heart Disease Risk Table                     Men   Women  1/2 Average Risk   3.4   3.3  Average Risk       5.0   4.4  2 X Average Risk   9.6   7.1  3 X Average Risk  23.4   11.0        Use the calculated Patient Ratio above and the CHD Risk Table to determine the patient's CHD Risk.        ATP  III CLASSIFICATION (LDL):  <100     mg/dL   Optimal  100-129  mg/dL   Near or Above                    Optimal  130-159  mg/dL   Borderline  160-189  mg/dL   High  >190     mg/dL   Very High   TSH     Status: None   Collection Time: 12/22/14  6:01 AM  Result Value Ref Range   TSH 0.927 0.350 - 4.500 uIU/mL  Urinalysis complete, with microscopic ([facility] only)     Status: Abnormal   Collection Time: 12/22/14  3:00 PM  Result Value Ref Range   Color, Urine YELLOW (A) YELLOW   APPearance CLEAR (A) CLEAR   Glucose, UA NEGATIVE NEGATIVE mg/dL   Bilirubin Urine NEGATIVE NEGATIVE   Ketones, ur NEGATIVE NEGATIVE mg/dL   Specific Gravity, Urine 1.011 1.005 - 1.030   Hgb urine dipstick NEGATIVE NEGATIVE   pH 5.0 5.0 - 8.0   Protein, ur NEGATIVE NEGATIVE mg/dL   Nitrite NEGATIVE NEGATIVE   Leukocytes, UA NEGATIVE NEGATIVE   RBC / HPF 0-5 0 - 5 RBC/hpf   WBC, UA 0-5 0 - 5 WBC/hpf   Bacteria, UA NONE SEEN NONE SEEN   Squamous  Epithelial / LPF NONE SEEN NONE SEEN  Urine Drug Screen, Qualitative ([facility] only)     Status: None   Collection Time: 12/22/14  3:00 PM  Result Value Ref Range   Tricyclic, Ur Screen NONE DETECTED NONE DETECTED   Amphetamines, Ur Screen NONE DETECTED NONE DETECTED   MDMA (Ecstasy)Ur Screen NONE DETECTED NONE DETECTED   Cocaine Metabolite,Ur [place] NONE DETECTED NONE DETECTED   Opiate, Ur Screen NONE DETECTED NONE DETECTED   Phencyclidine (PCP) Ur S NONE DETECTED NONE DETECTED   Cannabinoid 50 Ng, Ur [place] NONE DETECTED NONE DETECTED   Barbiturates, Ur Screen NONE DETECTED NONE DETECTED   Benzodiazepine, Ur Scrn NONE DETECTED NONE DETECTED   Methadone Scn, Ur NONE DETECTED NONE DETECTED    Comment: (NOTE) 209  Tricyclics, urine               Cutoff 1000 ng/mL 200  Amphetamines, urine             Cutoff 1000 ng/mL 300  MDMA (Ecstasy), urine           Cutoff 500 ng/mL 400  Cocaine Metabolite, urine       Cutoff 300 ng/mL 500  Opiate, urine                   Cutoff 300  ng/mL 600  Phencyclidine (PCP), urine      Cutoff 25 ng/mL 700  Cannabinoid, urine              Cutoff 50 ng/mL 800  Barbiturates, urine             Cutoff 200 ng/mL 900  Benzodiazepine, urine           Cutoff 200 ng/mL 1000 Methadone, urine                Cutoff 300 ng/mL 1100 1200 The urine drug screen provides only a preliminary, unconfirmed 1300 analytical test result and should not be used for non-medical 1400 purposes. Clinical consideration and professional judgment should 1500 be applied to any positive drug screen result due to possible 1600 interfering substances. A more specific alternate chemical method 1700 must be used in order to obtain a confirmed analytical result.  1800 Gas chromato graphy / mass spectrometry (GC/MS) is the preferred 1900 confirmatory method.     Physical Findings: AIMS: Facial and Oral Movements Muscles of Facial Expression: None, normal Lips and Perioral Area: None, normal Jaw: None, normal Tongue: None, normal,Extremity Movements Upper (arms, wrists, hands, fingers): None, normal Lower (legs, knees, ankles, toes): None, normal, Trunk Movements Neck, shoulders, hips: None, normal, Overall Severity Severity of abnormal movements (highest score from questions above): None, normal Incapacitation due to abnormal movements: None, normal Patient's awareness of abnormal movements (rate only patient's report): No Awareness, Dental Status Current problems with teeth and/or dentures?: No Does patient usually wear dentures?: No  CIWA:    COWS:     Treatment Plan Summary: Daily contact with patient to assess and evaluate symptoms and progress in treatment and Medication management   48 year old African-American male with history of antisocial behavior, substance abuse, aggression and currently endorsing symptoms of depression as a result of losing his job and apartment. The patient feels people are not supportive to him and he feels everybody is against  him. Patient was quite defensive during assessment does not appear to have any insight into his aggressive behavior and justifies his actions. No evidence of psychosis, mania or hypomania.  Major depressive disorder:  Patient has been restarted on sertraline 25 mg by mouth daily in order to address irritability, anger and depressive symptomatology.  Cocaine and cannabis alcohol abuse: Patient will benefit from substance abuse treatment at discharge.  Insomnia: Patient will be started on trazodone 100 mg by mouth daily at bedtime when necessary.  Tobacco use disorder: We will offer nicotine patch of 21 mg  Hypertension: Blood pressure currently is stable without antihypertensives.  GERD: Patient will be started on Protonix 40 mg by mouth daily  Inguinal abscess: started on keflex by surgery.  Examination of his left hemiscrotum demonstrates a 2 cm x 3 cm area of induration but no evidence of cellulitis or purulent drainage. Per general surgery: Assessment: Resolving left hemiscrotal abscess I suspect this is a sebaceous cyst which will ultimately need to be excised in the near future.  Plan  Warm compress. Keflex was written. Have patient follow-up in our offices discharge.  Labs: TSH was within the normal limits. Hemoglobin A1c is pending. Lipid panel is within the normal limits, only showing a slightly decreased HDL. Appears to have chronic renal insufficiency. CBC within normal limits. All electrolytes were within the normal limits. UA and urine toxicology are still pending.  Discharge planning: Social worker is looking into options for disposition. His options are limited as he is a registered sex offender. Social worker has contacted [facility] and [facility].  Medical Decision Making:  Established Problem, Stable/Improving (1)     Hildred Priest 12/23/2014, 11:54 AM

## 2014-12-23 NOTE — [facility] Group Notes (Signed)
[place] LCSW Group Therapy  12/23/2014 2:43 PM  Type of Therapy:  Group Therapy  Participation Level:  Active  Participation Quality:  Appropriate and Attentive  Affect:  Appropriate  Cognitive:  Alert, Appropriate and Oriented  Insight:  Engaged  Engagement in Therapy:  Engaged  Modes of Intervention:  Socialization and Support  Summary of Progress/Problems: Patient was attentive and appropriately sharing during introductions that his 3 things that he would take on a deserted [place] are "a house, boat, and Bible". Patient was able to related to feelings around relapse such as anger, hurt, and disappointment.   Keene Breath, MSW, LCSWA 12/23/2014, 2:43 PM

## 2014-12-23 NOTE — Plan of Care (Signed)
Problem: [facility] Participation in Recreation Therapeutic Interventions Goal: STG-Patient will demonstrate improved self esteem by identif STG: Self-Esteem - Within 3 treatment sessions, patient will verbalize at least 5 positive affirmation statements in one treatment session to increase self-esteem post d/c.  Outcome: Completed/Met Date Met:  12/23/14 Treatment Session 1; Completed 1 out of 1; At approximately 12:05 pm, LRT met with patient in craft room. Patient verbalized 5 positive affirmation statements. Patient reported it felt "positive". LRT encouraged patient to continue saying positive affirmation statements.  Leonette Monarch, LRT/CTRS 09.09.16 2:16 pm Goal: STG-Patient will identify at least five coping skills for ** STG: Coping Skills - Within 3 treatment sessions, patient will verbalize at least 5 coping skills for substance abuse in one treatment session to decrease substance abuse post d/c.  Outcome: Completed/Met Date Met:  12/23/14 Treatment Session 1; Completed 1 out of 1: At approximately 12:05 pm, LRT met with patient in craft room. Patient verbalized 5 coping skills for substance abuse. LRT educated patient on leisure and why it is important to implement into his schedule. LRT provided patient with blank schedules to help him plan his day and try to avoid using substances. LRT educated patient on healthy support systems.  Leonette Monarch, LRT/CTRS 09.09.16 2:18 pm Goal: STG-Other Recreation Therapy Goal (Specify) STG: Stress Management - Within 3 treatment sessions, patient will verbalize understanding of the stress management techniques in one treatment session to increase stress management skills post d/c.  Outcome: Completed/Met Date Met:  12/23/14 Treatment Session 1; Completed 1 out of 1: At approximately 12:05 pm, LRT met with patient in craft room. LRT educated and provided patient with handouts on stress management techniques. Patient verbalized understanding. LRT  encouraged patient to read over and practice the stress management techniques.  Leonette Monarch, LRT/CTRS 09.09.16 2:20 pm

## 2014-12-23 NOTE — Progress Notes (Signed)
Recreation Therapy Notes  Date: 09.09.16 Time: 3:00 pm Location: Craft Room  Group Topic: Coping Skills  Goal Area(s) Addresses:  Patient will participate in coping skill. Patient will verbalize benefit of using art as a coping skill.  Behavioral Response: Attentive  Intervention: Coloring  Activity: Patients were given coloring sheets and instructed to color and think about the emotions they experienced.  Education: LRT educated patients on healthy coping skills.  Education Outcome: In group clarification offered  Clinical Observations/Feedback: Patient colored worksheet. Patient did not contribute to group discussion.  Leonette Monarch, LRT/CTRS 12/23/2014 4:29 PM

## 2014-12-23 NOTE — Plan of Care (Signed)
Problem: Alteration in mood Goal: LTG-Patient reports reduction in suicidal thoughts (Patient reports reduction in suicidal thoughts and is able to verbalize a safety plan for whenever patient is feeling suicidal)  Outcome: Progressing Denies SI at present

## 2014-12-23 NOTE — Consult Note (Signed)
Patient ID: Ethan [name], male   DOB: 06-Sep-1965, 49 y.o.   MRN: [id number]  No chief complaint on file.   HPI  Ethan [name] is a 49 y.o. male. Admitted to the psychiatric unit with a depressive episode. He's had approximately 1 week history of left hemiscrotal swelling. He's had intermittent drainage. Currently without pain. Dr. Jerilee Hoh asked me to evaluate. The patient states that he's had multiple areas of subcutaneous abscess formation in the past.  Past Medical History  Diagnosis Date  . Hepatitis C   . Hypertension     History reviewed. No pertinent past surgical history.  History reviewed. No pertinent family history.  Social History Social History  Substance Use Topics  . Smoking status: Current Every Day Smoker    Types: Cigarettes  . Smokeless tobacco: None  . Alcohol Use: No    No Known Allergies  Current Facility-Administered Medications  Medication Dose Route Frequency Provider Last Rate Last Dose  . acetaminophen (TYLENOL) tablet 650 mg  650 mg Oral Q6H PRN Hildred Priest, MD      . alum & mag hydroxide-simeth (MAALOX/MYLANTA) 200-200-20 MG/5ML suspension 30 mL  30 mL Oral Q4H PRN Hildred Priest, MD      . cephALEXin (KEFLEX) capsule 500 mg  500 mg Oral 4 times per day Sherri Rad, MD      . hydrOXYzine (ATARAX/VISTARIL) tablet 50 mg  50 mg Oral Q6H PRN Hildred Priest, MD      . magnesium hydroxide (MILK OF MAGNESIA) suspension 30 mL  30 mL Oral Daily PRN Hildred Priest, MD      . nicotine (NICODERM CQ - dosed in mg/24 hours) patch 14 mg  14 mg Transdermal Daily Hildred Priest, MD   14 mg at 12/22/14 1456  . pantoprazole (PROTONIX) EC tablet 40 mg  40 mg Oral Daily Hildred Priest, MD   40 mg at 12/23/14 0912  . sertraline (ZOLOFT) tablet 25 mg  25 mg Oral Daily Hildred Priest, MD   25 mg at 12/23/14 0912  . traZODone (DESYREL) tablet 100 mg  100 mg Oral QHS PRN Hildred Priest, MD         Blood pressure 112/77, pulse 70, temperature 98.6 F (37 C), temperature source Oral, resp. rate 20, height $RemoveBe'5\' 9"'SKvYSwdkU$  (1.753 m), weight 184 lb (83.462 kg), SpO2 99 %.  Results for orders placed or performed during the hospital encounter of 12/21/14 (from the past 48 hour(s))  CBC     Status: None   Collection Time: 12/22/14  6:01 AM  Result Value Ref Range   WBC 5.8 3.8 - 10.6 K/uL   RBC 5.47 4.40 - 5.90 MIL/uL   Hemoglobin 14.8 13.0 - 18.0 g/dL   HCT 45.4 40.0 - 52.0 %   MCV 83.0 80.0 - 100.0 fL   MCH 27.1 26.0 - 34.0 pg   MCHC 32.6 32.0 - 36.0 g/dL   RDW 13.0 11.5 - 14.5 %   Platelets 293 150 - 440 K/uL  Comprehensive metabolic panel     Status: Abnormal   Collection Time: 12/22/14  6:01 AM  Result Value Ref Range   Sodium 140 135 - 145 mmol/L   Potassium 4.5 3.5 - 5.1 mmol/L   Chloride 107 101 - 111 mmol/L   CO2 25 22 - 32 mmol/L   Glucose, Bld 82 65 - 99 mg/dL   BUN 11 6 - 20 mg/dL   Creatinine, Ser 1.25 (H) 0.61 - 1.24 mg/dL   Calcium 9.1  8.9 - 10.3 mg/dL   Total Protein 7.4 6.5 - 8.1 g/dL   Albumin 3.3 (L) 3.5 - 5.0 g/dL   AST 28 15 - 41 U/L   ALT 20 17 - 63 U/L   Alkaline Phosphatase 64 38 - 126 U/L   Total Bilirubin 0.3 0.3 - 1.2 mg/dL   GFR calc non Af Amer >60 >60 mL/min   GFR calc Af Amer >60 >60 mL/min    Comment: (NOTE) The eGFR has been calculated using the CKD EPI equation. This calculation has not been validated in all clinical situations. eGFR's persistently <60 mL/min signify possible Chronic Kidney Disease.    Anion gap 8 5 - 15  Hemoglobin A1c     Status: Abnormal   Collection Time: 12/22/14  6:01 AM  Result Value Ref Range   Hgb A1c MFr Bld 6.1 (H) 4.0 - 6.0 %  Ethanol     Status: None   Collection Time: 12/22/14  6:01 AM  Result Value Ref Range   Alcohol, Ethyl (B) <5 <5 mg/dL    Comment:        LOWEST DETECTABLE LIMIT FOR SERUM ALCOHOL IS 5 mg/dL FOR MEDICAL PURPOSES ONLY   Lipid panel, fasting     Status:  Abnormal   Collection Time: 12/22/14  6:01 AM  Result Value Ref Range   Cholesterol 137 0 - 200 mg/dL   Triglycerides 38 <150 mg/dL   HDL 36 (L) >40 mg/dL   Total CHOL/HDL Ratio 3.8 RATIO   VLDL 8 0 - 40 mg/dL   LDL Cholesterol 93 0 - 99 mg/dL    Comment:        Total Cholesterol/HDL:CHD Risk Coronary Heart Disease Risk Table                     Men   Women  1/2 Average Risk   3.4   3.3  Average Risk       5.0   4.4  2 X Average Risk   9.6   7.1  3 X Average Risk  23.4   11.0        Use the calculated Patient Ratio above and the CHD Risk Table to determine the patient's CHD Risk.        ATP III CLASSIFICATION (LDL):  <100     mg/dL   Optimal  100-129  mg/dL   Near or Above                    Optimal  130-159  mg/dL   Borderline  160-189  mg/dL   High  >190     mg/dL   Very High   TSH     Status: None   Collection Time: 12/22/14  6:01 AM  Result Value Ref Range   TSH 0.927 0.350 - 4.500 uIU/mL  Urinalysis complete, with microscopic ([facility] only)     Status: Abnormal   Collection Time: 12/22/14  3:00 PM  Result Value Ref Range   Color, Urine YELLOW (A) YELLOW   APPearance CLEAR (A) CLEAR   Glucose, UA NEGATIVE NEGATIVE mg/dL   Bilirubin Urine NEGATIVE NEGATIVE   Ketones, ur NEGATIVE NEGATIVE mg/dL   Specific Gravity, Urine 1.011 1.005 - 1.030   Hgb urine dipstick NEGATIVE NEGATIVE   pH 5.0 5.0 - 8.0   Protein, ur NEGATIVE NEGATIVE mg/dL   Nitrite NEGATIVE NEGATIVE   Leukocytes, UA NEGATIVE NEGATIVE   RBC / HPF 0-5 0 - 5  RBC/hpf   WBC, UA 0-5 0 - 5 WBC/hpf   Bacteria, UA NONE SEEN NONE SEEN   Squamous Epithelial / LPF NONE SEEN NONE SEEN  Urine Drug Screen, Qualitative ([facility] only)     Status: None   Collection Time: 12/22/14  3:00 PM  Result Value Ref Range   Tricyclic, Ur Screen NONE DETECTED NONE DETECTED   Amphetamines, Ur Screen NONE DETECTED NONE DETECTED   MDMA (Ecstasy)Ur Screen NONE DETECTED NONE DETECTED   Cocaine Metabolite,Ur [place] NONE DETECTED NONE  DETECTED   Opiate, Ur Screen NONE DETECTED NONE DETECTED   Phencyclidine (PCP) Ur S NONE DETECTED NONE DETECTED   Cannabinoid 50 Ng, Ur [place] NONE DETECTED NONE DETECTED   Barbiturates, Ur Screen NONE DETECTED NONE DETECTED   Benzodiazepine, Ur Scrn NONE DETECTED NONE DETECTED   Methadone Scn, Ur NONE DETECTED NONE DETECTED    Comment: (NOTE) 601  Tricyclics, urine               Cutoff 1000 ng/mL 200  Amphetamines, urine             Cutoff 1000 ng/mL 300  MDMA (Ecstasy), urine           Cutoff 500 ng/mL 400  Cocaine Metabolite, urine       Cutoff 300 ng/mL 500  Opiate, urine                   Cutoff 300 ng/mL 600  Phencyclidine (PCP), urine      Cutoff 25 ng/mL 700  Cannabinoid, urine              Cutoff 50 ng/mL 800  Barbiturates, urine             Cutoff 200 ng/mL 900  Benzodiazepine, urine           Cutoff 200 ng/mL 1000 Methadone, urine                Cutoff 300 ng/mL 1100 1200 The urine drug screen provides only a preliminary, unconfirmed 1300 analytical test result and should not be used for non-medical 1400 purposes. Clinical consideration and professional judgment should 1500 be applied to any positive drug screen result due to possible 1600 interfering substances. A more specific alternate chemical method 1700 must be used in order to obtain a confirmed analytical result.  1800 Gas chromato graphy / mass spectrometry (GC/MS) is the preferred 1900 confirmatory method.    No results found.  Review of Systems  Constitutional: Negative for fever, chills and malaise/fatigue.  Genitourinary:       See history of present illness.  Skin: Negative.   All other systems reviewed and are negative.   Physical Exam Physical Exam Pleasant oriented black male in no obvious distress. Examination of his left hemiscrotum demonstrates a 2 cm x 3 cm area of induration but no evidence of cellulitis or purulent drainage.  Assessment    Resolving left hemiscrotal abscess I suspect this  is a sebaceous cyst which will ultimately need to be excised in the near future.    Plan    Warm compress. Keflex was written. Have patient follow-up in our offices discharge. Thank you for the consult: Call with any questions.       Sherri Rad MD, FACS 12/23/2014, 9:55 AM

## 2014-12-23 NOTE — Progress Notes (Signed)
D: Patient denies SI/HI/AVH. Patient affect brighter. Patient interaction with staff is minimal. Patient attend evening group. Patient visible on the milieu. No distress noted. A: Support and encouragement offered. Scheduled medications given to pt. Q 15 min checks continued for patient safety. R: Patient receptive. Patient remains safe on the unit.

## 2014-12-24 MED ORDER — TRAZODONE HCL 100 MG PO TABS
100.0000 mg | ORAL_TABLET | Freq: Every day | ORAL | Status: DC
  Administered 2014-12-24: 100 mg via ORAL
  Filled 2014-12-24: qty 1

## 2014-12-24 NOTE — Plan of Care (Signed)
Problem: Alteration in mood Goal: LTG-Patient reports reduction in suicidal thoughts (Patient reports reduction in suicidal thoughts and is able to verbalize a safety plan for whenever patient is feeling suicidal)  Outcome: Progressing Denies current SI, contracts for safety in hospital. Goal: LTG-Pt's behavior demonstrates decreased signs of depression (Patient's behavior demonstrates decreased signs of depression to the point the patient is safe to return home and continue treatment in an outpatient setting)  Outcome: Progressing Pleasant, interactive in milieu, participation in groups.  Goal: STG-Patient is able to discuss feelings and issues (Patient is able to discuss feelings and issues leading to depression)  Outcome: Progressing Patient cooperative with assessment process.  Goal: STG-Patient reports thoughts of self-harm to staff Outcome: Progressing Pt contracts for safety.

## 2014-12-24 NOTE — BHH Group Notes (Signed)
[facility] LCSW Aftercare Discharge Planning Group Note (Late note from 12/23/2014)    12/24/2014 9:25 AM  Participation Quality:  Minimal   Mood/Affect:  Flat  Depression Rating:  5  Anxiety Rating:  5  Thoughts of Suicide:  Yes "I want to go to sleep"  Will you contract for safety?   Yes  Current AVH:  No  Plan for Discharge/Comments:  Pt wants to go to an inpatient substance abuse treatment program.   Transportation Means: Bus   Supports: None   [place] MSW, [employer]

## 2014-12-24 NOTE — BHH Group Notes (Signed)
[facility] LCSW Group Therapy  12/24/2014 2:25 PM  Type of Therapy:  Group Therapy  Participation Level:  Did Not Attend   Modes of Intervention:  Discussion, Education, Socialization and Support  Summary of Progress/Problems: Pt will identify unhealthy thoughts and how they impact their emotions and behavior. Pt will be encouraged to discuss these thoughts, emotions and behaviors with the group.   [place] MSW, [place]  12/24/2014, 2:25 PM

## 2014-12-24 NOTE — Progress Notes (Addendum)
[facility] MD Progress Note  12/24/2014 1:04 PM Ethan [name]  MRN:  [id number] Subjective:  Patient was on phone when I was attempting to retrieve him. He did indicate was talking with his girlfriend. Asked about any physical issues he stated that he does have an overactive bladder and was previous to taking medication. I asked him what medication it was any said it started with the letter O. I then asked him whether he was did trip and her oxybutynin and he said he was not sure. He states he last took it about 2 months ago. He states however he ran out of finances and cannot continue it. I told him that he would need follow-up for this type of medication and that it was started in the hospital he would ultimately run out of it without follow-up.  He said his appetite is been good. He states his sleep has not been good. He indicated he has not been asking for his as needed trazodone. He states in regards to any suicidal thoughts that he is "at peace" now. However he stated that when he leaves his when those suicidal thoughts start to escalate.   Principal Problem: Major depressive disorder, recurrent episode, moderate with anxious distress Diagnosis:   Patient Active Problem List   Diagnosis Date Noted  . Scrotal abscess [N49.2] 12/23/2014  . HTN (hypertension) [I10] 12/22/2014  . GERD (gastroesophageal reflux disease) [K21.9] 12/22/2014  . Tobacco use disorder [Z72.0] 12/22/2014  . 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 Cannabis use disorder, severe, dependence [F12.20] 12/22/2014  . Alcohol use disorder, mild, abuse [F10.10] 12/22/2014  . Major depressive disorder, recurrent episode, moderate with anxious distress [F33.1] 12/22/2014  . Antisocial personality disorder [F60.2] 12/22/2014  . Hepatitis C (treated) [B19.20] 09/01/2013   Total Time spent with patient: 30 minutes   Past Medical History:  Past Medical History  Diagnosis Date  . Hepatitis C   . Hypertension    History reviewed. No pertinent past surgical history. Family History: History reviewed. No pertinent family history. Social History:  History  Alcohol Use No     History  Drug Use  . Yes  . Special: "Crack" cocaine    Social History   Social History  . Marital Status: Married    Spouse Name: N/A  . Number of Children: N/A  . Years of Education: N/A   Social History Main Topics  . Smoking status: Current Every Day Smoker    Types: Cigarettes  . Smokeless tobacco: None  . Alcohol Use: No  . Drug Use: Yes    Special: "Crack" cocaine  . Sexual Activity:    Partners: Female     Comment: WIFE   Other Topics Concern  . None   Social History Narrative   Additional History:    Sleep: Good  Appetite:  Fair   Assessment:   Musculoskeletal: Strength & Muscle Tone: within normal limits Gait & Station: normal Patient leans: N/A   Psychiatric Specialty Exam: Physical Exam  Review of Systems  Constitutional: Negative.   HENT: Negative.   Eyes: Negative.   Respiratory: Negative.   Cardiovascular: Negative.   Gastrointestinal: Negative.   Genitourinary: Negative.   Musculoskeletal: Negative.   Skin: Negative.   Neurological: Negative.   Endo/Heme/Allergies: Negative.   Psychiatric/Behavioral: Positive for depression, suicidal ideas and substance abuse. Negative for hallucinations and memory loss. The patient has insomnia. The patient is not nervous/anxious.     Blood pressure 150/90, pulse 73, temperature 98.6 F (  37 C), temperature source Oral, resp. rate 20, height 5\' 9"  (1.753 m), weight 184 lb (83.462 kg), SpO2 99 %.Body mass index is 27.16 kg/(m^2).  General Appearance: Well Groomed  Engineer, water::  Good  Speech:  Normal Rate  Volume:  Normal  Mood:  Good  Affect:  Constricted  Thought Process:  Linear  Orientation:  Full (Time, Place, and Person)  Thought Content:  Hallucinations: None  Suicidal Thoughts:  No  Homicidal Thoughts:  No  Memory:   Immediate;   Good Recent;   Good Remote;   Good  Judgement:  Poor  Insight:  Shallow  Psychomotor Activity:  Normal  Concentration:  NA  Recall:  NA  Fund of Knowledge:Good  Language: Good  Akathisia:  No  Handed:    AIMS (if indicated):     Assets:  Armed forces logistics/support/administrative officer Physical Health  ADL's:  Intact  Cognition: WNL  Sleep:  Number of Hours: 5.25     Current Medications: Current Facility-Administered Medications  Medication Dose Route Frequency Provider Last Rate Last Dose  . acetaminophen (TYLENOL) tablet 650 mg  650 mg Oral Q6H PRN Hildred Priest, MD      . alum & mag hydroxide-simeth (MAALOX/MYLANTA) 200-200-20 MG/5ML suspension 30 mL  30 mL Oral Q4H PRN Hildred Priest, MD      . cephALEXin (KEFLEX) capsule 500 mg  500 mg Oral 4 times per day Sherri Rad, MD   500 mg at 12/24/14 1140  . hydrOXYzine (ATARAX/VISTARIL) tablet 50 mg  50 mg Oral Q6H PRN Hildred Priest, MD      . magnesium hydroxide (MILK OF MAGNESIA) suspension 30 mL  30 mL Oral Daily PRN Hildred Priest, MD      . nicotine (NICODERM CQ - dosed in mg/24 hours) patch 14 mg  14 mg Transdermal Daily Hildred Priest, MD   14 mg at 12/22/14 1456  . pantoprazole (PROTONIX) EC tablet 40 mg  40 mg Oral Daily Hildred Priest, MD   40 mg at 12/24/14 1008  . sertraline (ZOLOFT) tablet 25 mg  25 mg Oral Daily Hildred Priest, MD   25 mg at 12/24/14 1008  . traZODone (DESYREL) tablet 100 mg  100 mg Oral QHS PRN Hildred Priest, MD        Lab Results:  Results for orders placed or performed during the hospital encounter of 12/21/14 (from the past 48 hour(s))  Urinalysis complete, with microscopic ([place] only)     Status: Abnormal   Collection Time: 12/22/14  3:00 PM  Result Value Ref Range   Color, Urine YELLOW (A) YELLOW   APPearance CLEAR (A) CLEAR   Glucose, UA NEGATIVE NEGATIVE mg/dL   Bilirubin Urine NEGATIVE NEGATIVE   Ketones, ur  NEGATIVE NEGATIVE mg/dL   Specific Gravity, Urine 1.011 1.005 - 1.030   Hgb urine dipstick NEGATIVE NEGATIVE   pH 5.0 5.0 - 8.0   Protein, ur NEGATIVE NEGATIVE mg/dL   Nitrite NEGATIVE NEGATIVE   Leukocytes, UA NEGATIVE NEGATIVE   RBC / HPF 0-5 0 - 5 RBC/hpf   WBC, UA 0-5 0 - 5 WBC/hpf   Bacteria, UA NONE SEEN NONE SEEN   Squamous Epithelial / LPF NONE SEEN NONE SEEN  Urine Drug Screen, Qualitative ([facility] only)     Status: None   Collection Time: 12/22/14  3:00 PM  Result Value Ref Range   Tricyclic, Ur Screen NONE DETECTED NONE DETECTED   Amphetamines, Ur Screen NONE DETECTED NONE DETECTED   MDMA (Ecstasy)Ur Screen NONE DETECTED NONE  DETECTED   Cocaine Metabolite,Ur [place] NONE DETECTED NONE DETECTED   Opiate, Ur Screen NONE DETECTED NONE DETECTED   Phencyclidine (PCP) Ur S NONE DETECTED NONE DETECTED   Cannabinoid 50 Ng, Ur [place] NONE DETECTED NONE DETECTED   Barbiturates, Ur Screen NONE DETECTED NONE DETECTED   Benzodiazepine, Ur Scrn NONE DETECTED NONE DETECTED   Methadone Scn, Ur NONE DETECTED NONE DETECTED    Comment: (NOTE) 537  Tricyclics, urine               Cutoff 1000 ng/mL 200  Amphetamines, urine             Cutoff 1000 ng/mL 300  MDMA (Ecstasy), urine           Cutoff 500 ng/mL 400  Cocaine Metabolite, urine       Cutoff 300 ng/mL 500  Opiate, urine                   Cutoff 300 ng/mL 600  Phencyclidine (PCP), urine      Cutoff 25 ng/mL 700  Cannabinoid, urine              Cutoff 50 ng/mL 800  Barbiturates, urine             Cutoff 200 ng/mL 900  Benzodiazepine, urine           Cutoff 200 ng/mL 1000 Methadone, urine                Cutoff 300 ng/mL 1100 1200 The urine drug screen provides only a preliminary, unconfirmed 1300 analytical test result and should not be used for non-medical 1400 purposes. Clinical consideration and professional judgment should 1500 be applied to any positive drug screen result due to possible 1600 interfering substances. A more specific  alternate chemical method 1700 must be used in order to obtain a confirmed analytical result.  1800 Gas chromato graphy / mass spectrometry (GC/MS) is the preferred 1900 confirmatory method.     Physical Findings: AIMS: Facial and Oral Movements Muscles of Facial Expression: None, normal Lips and Perioral Area: None, normal Jaw: None, normal Tongue: None, normal,Extremity Movements Upper (arms, wrists, hands, fingers): None, normal Lower (legs, knees, ankles, toes): None, normal, Trunk Movements Neck, shoulders, hips: None, normal, Overall Severity Severity of abnormal movements (highest score from questions above): None, normal Incapacitation due to abnormal movements: None, normal Patient's awareness of abnormal movements (rate only patient's report): No Awareness, Dental Status Current problems with teeth and/or dentures?: No Does patient usually wear dentures?: No  CIWA:    COWS:     Treatment Plan Summary: Daily contact with patient to assess and evaluate symptoms and progress in treatment and Medication management   48 year old African-American male with history of antisocial behavior, substance abuse, aggression and currently endorsing symptoms of depression as a result of losing his job and apartment. The patient feels people are not supportive to him and he feels everybody is against him. Patient was quite defensive during assessment does not appear to have any insight into his aggressive behavior and justifies his actions. No evidence of psychosis, mania or hypomania.  Major depressive disorder: Patient has been restarted on sertraline 25 mg by mouth daily in order to address irritability, anger and depressive symptomatology.  Cocaine and cannabis alcohol abuse: Patient will benefit from substance abuse treatment at discharge.  Insomnia: Will change his trazodone 100 mg at bedtime as standing as it appears he has not been receiving this.  Tobacco use disorder:  We will  offer nicotine patch of 21 mg  Hypertension: Blood pressure currently is stable without antihypertensives.  GERD: Patient will be started on Protonix 40 mg by mouth daily  Inguinal abscess: started on keflex by surgery.  Examination of his left hemiscrotum demonstrates a 2 cm x 3 cm area of induration but no evidence of cellulitis or purulent drainage. Per general surgery: Assessment: Resolving left hemiscrotal abscess I suspect this is a sebaceous cyst which will ultimately need to be excised in the near future.  Plan  Warm compress. Keflex was written. Have patient follow-up in our offices discharge.  Labs: TSH was within the normal limits. Hemoglobin A1c is pending. Lipid panel is within the normal limits, only showing a slightly decreased HDL. Appears to have chronic renal insufficiency. CBC within normal limits. All electrolytes were within the normal limits. UA and urine toxicology are still pending.  Discharge planning: Social worker is looking into options for disposition. His options are limited as he is a registered sex offender. Social worker has contacted [facility] and [facility].  Medical Decision Making:  Established Problem, Stable/Improving (1)     Faith Rogue 12/24/2014, 1:04 PM
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
## 2014-12-24 NOTE — Plan of Care (Signed)
Problem: Alteration in mood Goal: STG-Patient is able to discuss feelings and issues (Patient is able to discuss feelings and issues leading to depression)  Outcome: Progressing Patient expresses his feeling concerning his SI and how his life will be once he is discharged. Patient has anxiety about not being accepted due to his past.

## 2014-12-24 NOTE — BHH Group Notes (Signed)
[place] Group Notes:  (Nursing/MHT/Case Management/Adjunct)  Date:  12/24/2014  Time:  12:18 PM  Type of Therapy:  Psychoeducational Skills  Participation Level:  Active  Participation Quality:  Appropriate  Affect:  Flat  Cognitive:  Appropriate  Insight:  Appropriate  Engagement in Group:  Supportive  Modes of Intervention:  Clarification  Summary of Progress/Problems:  Ethan [name] 12/24/2014, 12:18 PM

## 2014-12-25 MED ORDER — AQUAPHOR EX OINT
TOPICAL_OINTMENT | Freq: Two times a day (BID) | CUTANEOUS | Status: AC
  Administered 2014-12-26 (×2): via TOPICAL
  Filled 2014-12-25 (×2): qty 50

## 2014-12-25 MED ORDER — TRAZODONE HCL 50 MG PO TABS
150.0000 mg | ORAL_TABLET | Freq: Every day | ORAL | Status: DC
  Administered 2014-12-25 – 2014-12-26 (×2): 150 mg via ORAL
  Filled 2014-12-25 (×2): qty 1

## 2014-12-25 MED ORDER — SERTRALINE HCL 25 MG PO TABS
50.0000 mg | ORAL_TABLET | Freq: Every day | ORAL | Status: DC
  Administered 2014-12-26 – 2014-12-27 (×2): 50 mg via ORAL
  Filled 2014-12-25 (×3): qty 2

## 2014-12-25 NOTE — Plan of Care (Signed)
Problem: Alteration in thought process Goal: LTG-Patient verbalizes understanding importance med regimen (Patient verbalizes understanding of importance of medication regimen and need to continue outpatient care.)  Outcome: Progressing Patient cooperative with meds and current plan of care. Presents to med room at appropriate times.

## 2014-12-25 NOTE — [facility] Group Notes (Signed)
[place] LCSW Group Therapy  12/25/2014 2:56 PM  Type of Therapy:  Group Therapy  Participation Level:  Minimal  Participation Quality:  Attentive  Affect:  Appropriate  Cognitive:  Alert  Insight:  Limited  Engagement in Therapy:  Limited  Modes of Intervention:  Discussion, Education, Socialization and Support  Summary of Progress/Problems:Feelings around Relapse. Group members discussed the meaning of relapse and shared personal stories of relapse, how it affected them and others, and how they perceived themselves during this time. Group members were encouraged to identify triggers, warning signs and coping skills used when facing the possibility of relapse. Social supports were discussed and explored in detail. Ethan [name] states he is trying to remain positive. He attended group and stayed the entire time. He sat quietly and listened to other group members.   [place] MSW, [place]  12/25/2014, 2:56 PM

## 2014-12-25 NOTE — Progress Notes (Signed)
Patient with depressed affect and cooperative behavior. No SI/HI/AVH at this time with Probation officer. Fair adls, good appetite. Sleepy in bed in room this. Low energy, quiet speech, fair eye contact. Minimal interaction with peers. Appropriate when staff initiates interaction. Safety maintained.

## 2014-12-25 NOTE — Progress Notes (Signed)
In dayroom at onset of shift watching TV. Was medication compliant. Pleasant to interact with. Denied AVH, SI, HI. Had an uneventful night.

## 2014-12-25 NOTE — Progress Notes (Signed)
[facility] MD Progress Note  12/25/2014 1:30 PM Ethan [name]  MRN:  [id number] Subjective:  Patient indicated he is feeling good. He did state that his sleep was not great and that he only slept 5 minutes longer than usual. He states that he normally wakes up about 2 in the morning and he woke up at 2:05 in the morning. He requests some type of treatment for his dry feet and calluses, but otherwise denies any physical issues. He denies any side effects from his medications.   Principal Problem: Major depressive disorder, recurrent episode, moderate with anxious distress Diagnosis:   Patient Active Problem List   Diagnosis Date Noted  . Scrotal abscess [N49.2] 12/23/2014  . HTN (hypertension) [I10] 12/22/2014  . GERD (gastroesophageal reflux disease) [K21.9] 12/22/2014  . Tobacco use disorder [Z72.0] 12/22/2014  . 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 Cannabis use disorder, severe, dependence [F12.20] 12/22/2014  . Alcohol use disorder, mild, abuse [F10.10] 12/22/2014  . Major depressive disorder, recurrent episode, moderate with anxious distress [F33.1] 12/22/2014  . Antisocial personality disorder [F60.2] 12/22/2014  . Hepatitis C (treated) [B19.20] 09/01/2013   Total Time spent with patient: 30 minutes   Past Medical History:  Past Medical History  Diagnosis Date  . Hepatitis C   . Hypertension    History reviewed. No pertinent past surgical history. Family History: History reviewed. No pertinent family history. Social History:  History  Alcohol Use No     History  Drug Use  . Yes  . Special: "Crack" cocaine    Social History   Social History  . Marital Status: Married    Spouse Name: N/A  . Number of Children: N/A  . Years of Education: N/A   Social History Main Topics  . Smoking status: Current Every Day Smoker    Types: Cigarettes  . Smokeless tobacco: None  . Alcohol Use: No  . Drug Use: Yes    Special: "Crack" cocaine  . Sexual Activity:    Partners: Female     Comment: WIFE   Other Topics Concern  . None   Social History Narrative   Additional History:    Sleep: Good  Appetite:  Fair   Assessment:   Musculoskeletal: Strength & Muscle Tone: within normal limits Gait & Station: normal Patient leans: N/A   Psychiatric Specialty Exam: Physical Exam  Review of Systems  Constitutional: Negative.   HENT: Negative.   Eyes: Negative.   Respiratory: Negative.   Cardiovascular: Negative.   Gastrointestinal: Negative.   Genitourinary: Negative.   Musculoskeletal: Negative.   Skin: Negative.   Neurological: Negative.   Endo/Heme/Allergies: Negative.   Psychiatric/Behavioral: Positive for depression, suicidal ideas and substance abuse. Negative for hallucinations and memory loss. The patient has insomnia. The patient is not nervous/anxious.     Blood pressure 126/73, pulse 73, temperature 98.6 F (37 C), temperature source Oral, resp. rate 20, height 5\' 9"  (1.753 m), weight 184 lb (83.462 kg), SpO2 93 %.Body mass index is 27.16 kg/(m^2).  General Appearance: Well Groomed  Engineer, water::  Good  Speech:  Normal Rate  Volume:  Normal  Mood:  Good  Affect:  Constricted  Thought Process:  Linear  Orientation:  Full (Time, Place, and Person)  Thought Content:  Hallucinations: None  Suicidal Thoughts:  No  Homicidal Thoughts:  No  Memory:  Immediate;   Good Recent;   Good Remote;   Good  Judgement:  Poor  Insight:  Shallow  Psychomotor Activity:  Normal  Concentration:  NA  Recall:  NA  Fund of Knowledge:Good  Language: Good  Akathisia:  No  Handed:    AIMS (if indicated):     Assets:  Communication Skills Physical Health  ADL's:  Intact  Cognition: WNL  Sleep:  Number of Hours: 5.25     Current Medications: Current Facility-Administered Medications  Medication Dose Route Frequency Provider Last Rate Last Dose  . acetaminophen (TYLENOL) tablet 650 mg  650 mg Oral Q6H PRN Hildred Priest,  MD      . alum & mag hydroxide-simeth (MAALOX/MYLANTA) 200-200-20 MG/5ML suspension 30 mL  30 mL Oral Q4H PRN Hildred Priest, MD      . cephALEXin (KEFLEX) capsule 500 mg  500 mg Oral 4 times per day Sherri Rad, MD   500 mg at 12/25/14 1154  . hydrOXYzine (ATARAX/VISTARIL) tablet 50 mg  50 mg Oral Q6H PRN Hildred Priest, MD      . magnesium hydroxide (MILK OF MAGNESIA) suspension 30 mL  30 mL Oral Daily PRN Hildred Priest, MD      . nicotine (NICODERM CQ - dosed in mg/24 hours) patch 14 mg  14 mg Transdermal Daily Hildred Priest, MD   14 mg at 12/22/14 1456  . pantoprazole (PROTONIX) EC tablet 40 mg  40 mg Oral Daily Hildred Priest, MD   40 mg at 12/25/14 0921  . [START ON 12/26/2014] sertraline (ZOLOFT) tablet 50 mg  50 mg Oral Daily Marjie Skiff, MD      . traZODone (DESYREL) tablet 150 mg  150 mg Oral QHS Marjie Skiff, MD        Lab Results:  No results found for this or any previous visit (from the past 48 hour(s)).  Physical Findings: AIMS: Facial and Oral Movements Muscles of Facial Expression: None, normal Lips and Perioral Area: None, normal Jaw: None, normal Tongue: None, normal,Extremity Movements Upper (arms, wrists, hands, fingers): None, normal Lower (legs, knees, ankles, toes): None, normal, Trunk Movements Neck, shoulders, hips: None, normal, Overall Severity Severity of abnormal movements (highest score from questions above): None, normal Incapacitation due to abnormal movements: None, normal Patient's awareness of abnormal movements (rate only patient's report): No Awareness, Dental Status Current problems with teeth and/or dentures?: No Does patient usually wear dentures?: No  CIWA:    COWS:     Treatment Plan Summary: Daily contact with patient to assess and evaluate symptoms and progress in treatment and Medication management   49 year old African-American male with history of antisocial behavior,  substance abuse, aggression and currently endorsing symptoms of depression as a result of losing his job and apartment. The patient feels people are not supportive to him and he feels everybody is against him. Patient was quite defensive during assessment does not appear to have any insight into his aggressive behavior and justifies his actions. No evidence of psychosis, mania or hypomania.  Major depressive disorder: Will increase sertraline to 50 mg by mouth daily in order to address irritability, anger and depressive symptomatology.  Cocaine and cannabis alcohol abuse: Patient will benefit from substance abuse treatment at discharge.  Insomnia: Will increase his trazodone to 150 mg at bedtime as standing as it appears he has not been receiving this.  Tobacco use disorder: We will offer nicotine patch of 21 mg  Hypertension: Blood pressure currently is stable without antihypertensives.  GERD: Patient will be started on Protonix 40 mg by mouth daily  Inguinal abscess: started on keflex by surgery.  Foot calluses-Aquaphor ointment BID for  two days.  Examination of his left hemiscrotum demonstrates a 2 cm x 3 cm area of induration but no evidence of cellulitis or purulent drainage. Per general surgery: Assessment: Resolving left hemiscrotal abscess I suspect this is a sebaceous cyst which will ultimately need to be excised in the near future.  Plan  Warm compress. Keflex was written. Have patient follow-up in our offices discharge.  Labs: TSH was within the normal limits. Hemoglobin A1c is pending. Lipid panel is within the normal limits, only showing a slightly decreased HDL. Appears to have chronic renal insufficiency. CBC within normal limits. All electrolytes were within the normal limits. UA and urine toxicology are still pending.  Discharge planning: Social worker is looking into options for disposition. His options are limited as he is a registered sex offender. Social worker has  contacted [facility] and [facility].  Medical Decision Making:  Established Problem, Stable/Improving (1)     Faith Rogue 12/25/2014, 1:30 PM

## 2014-12-26 MED ORDER — CEPHALEXIN 500 MG PO CAPS: 500.0000 mg | ORAL_CAPSULE | Freq: Four times a day (QID) | ORAL | Status: DC

## 2014-12-26 MED ORDER — SERTRALINE HCL 50 MG PO TABS: 50.0000 mg | ORAL_TABLET | Freq: Every day | ORAL | Status: DC

## 2014-12-26 MED ORDER — PANTOPRAZOLE SODIUM 40 MG PO TBEC: 40.0000 mg | DELAYED_RELEASE_TABLET | Freq: Every day | ORAL | Status: DC

## 2014-12-26 MED ORDER — TRAZODONE HCL 150 MG PO TABS: 150.0000 mg | ORAL_TABLET | Freq: Every evening | ORAL | Status: DC | PRN

## 2014-12-26 MED ORDER — AMLODIPINE BESYLATE 2.5 MG PO TABS
2.5000 mg | ORAL_TABLET | Freq: Every day | ORAL | Status: DC
  Administered 2014-12-26 – 2014-12-27 (×2): 2.5 mg via ORAL
  Filled 2014-12-26 (×2): qty 1

## 2014-12-26 NOTE — BHH Group Notes (Signed)
[place] Group Notes:  (Nursing/MHT/Case Management/Adjunct)  Date:  12/26/2014  Time:  2:09 PM  Type of Therapy:  Psychoeducational Skills  Participation Level:  Did Not Attend  Ethan [name] 12/26/2014, 2:09 PM

## 2014-12-26 NOTE — Plan of Care (Signed)
Problem: Diagnosis: Increased Risk For Suicide Attempt Goal: LTG-Patient Will Report Absence of Withdrawal Symptoms LTG (by discharge): Patient will report absence of withdrawal symptoms.  Outcome: Progressing No evidence of withdrawal.

## 2014-12-26 NOTE — Progress Notes (Signed)
[facility] MD Progress Note  12/26/2014 4:54 PM Ethan [name]  MRN:  [id number] Subjective:  Patient agitated this morning as he feels we are "sneaking around" about his discharge.  On Friday patient was a stating that he plans just to buy some pills and overdose and died peacefully as he has nowhere to go, he feels nobody cares about him.  Patient stated that he was appreciative for all the help with getting him but he just felt frustrated and set up as he cannot move on from his past history (felon he charges and sex offenses).  Patient denied SI today, HI or having auditory or visual hallucinations. Patient however became agitated once again when possible discharge was discussed as we have not been able to find any disposition for him.   Patient has high chronic risk for suicidality as he has history of aggressive behavior, impulsivity he becomes easily agitated and frustrated, he has history of prior suicidal attempts and has been hospitalized a multitude of times for mental illness.  He also is actively abusing substances. Suffers from depression and has a personality disorder. Patient is unemployed, with no income and no social support.   Principal Problem: Major depressive disorder, recurrent episode, moderate with anxious distress Diagnosis:   Patient Active Problem List   Diagnosis Date Noted  . Scrotal abscess [N49.2] 12/23/2014  . HTN (hypertension) [I10] 12/22/2014  . GERD (gastroesophageal reflux disease) [K21.9] 12/22/2014  . Tobacco use disorder [Z72.0] 12/22/2014  . 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 Cannabis use disorder, severe, dependence [F12.20] 12/22/2014  . Alcohol use disorder, mild, abuse [F10.10] 12/22/2014  . Major depressive disorder, recurrent episode, moderate with anxious distress [F33.1] 12/22/2014  . Antisocial personality disorder [F60.2] 12/22/2014  . Hepatitis C (treated) [B19.20] 09/01/2013   Total Time spent with patient: 30  minutes   Past Medical History:  Past Medical History  Diagnosis Date  . Hepatitis C   . Hypertension    History reviewed. No pertinent past surgical history. Family History: History reviewed. No pertinent family history.   Social History:  History  Alcohol Use No     History  Drug Use  . Yes  . Special: "Crack" cocaine    Social History   Social History  . Marital Status: Married    Spouse Name: N/A  . Number of Children: N/A  . Years of Education: N/A   Social History Main Topics  . Smoking status: Current Every Day Smoker    Types: Cigarettes  . Smokeless tobacco: None  . Alcohol Use: No  . Drug Use: Yes    Special: "Crack" cocaine  . Sexual Activity:    Partners: Female     Comment: WIFE   Other Topics Concern  . None   Social History Narrative   Additional History:    Sleep: Good  Appetite:  Fair   Assessment:   Musculoskeletal: Strength & Muscle Tone: within normal limits Gait & Station: normal Patient leans: N/A   Psychiatric Specialty Exam: Physical Exam   Review of Systems  Constitutional: Negative.   HENT: Negative.   Eyes: Negative.   Respiratory: Negative.   Cardiovascular: Negative.   Gastrointestinal: Negative.   Genitourinary: Negative.   Musculoskeletal: Negative.   Skin: Negative.   Neurological: Negative.   Endo/Heme/Allergies: Negative.   Psychiatric/Behavioral: Positive for depression and substance abuse. Negative for suicidal ideas, hallucinations and memory loss. The patient is not nervous/anxious and does not have insomnia.     Blood  pressure 144/97, pulse 67, temperature 98.6 F (37 C), temperature source Oral, resp. rate 20, height 5\' 9"  (1.753 m), weight 83.462 kg (184 lb), SpO2 93 %.Body mass index is 27.16 kg/(m^2).  General Appearance: Well Groomed  Engineer, water::  Good  Speech:  Normal Rate  Volume:  Normal  Mood:  Irritable  Affect:  Constricted  Thought Process:  Linear  Orientation:  Full (Time,  Place, and Person)  Thought Content:  Hallucinations: None  Suicidal Thoughts:  No  Homicidal Thoughts:  No  Memory:  Immediate;   Good Recent;   Good Remote;   Good  Judgement:  Poor  Insight:  Shallow  Psychomotor Activity:  Normal  Concentration:  NA  Recall:  NA  Fund of Knowledge:Good  Language: Good  Akathisia:  No  Handed:    AIMS (if indicated):     Assets:  Armed forces logistics/support/administrative officer Physical Health  ADL's:  Intact  Cognition: WNL  Sleep:  Number of Hours: 7.15     Current Medications: Current Facility-Administered Medications  Medication Dose Route Frequency Provider Last Rate Last Dose  . acetaminophen (TYLENOL) tablet 650 mg  650 mg Oral Q6H PRN Hildred Priest, MD      . alum & mag hydroxide-simeth (MAALOX/MYLANTA) 200-200-20 MG/5ML suspension 30 mL  30 mL Oral Q4H PRN Hildred Priest, MD      . cephALEXin (KEFLEX) capsule 500 mg  500 mg Oral 4 times per day Sherri Rad, MD   500 mg at 12/26/14 1240  . hydrOXYzine (ATARAX/VISTARIL) tablet 50 mg  50 mg Oral Q6H PRN Hildred Priest, MD   50 mg at 12/25/14 2151  . magnesium hydroxide (MILK OF MAGNESIA) suspension 30 mL  30 mL Oral Daily PRN Hildred Priest, MD      . mineral oil-hydrophilic petrolatum (AQUAPHOR) ointment   Topical BID Marjie Skiff, MD      . nicotine (NICODERM CQ - dosed in mg/24 hours) patch 14 mg  14 mg Transdermal Daily Hildred Priest, MD   14 mg at 12/22/14 1456  . pantoprazole (PROTONIX) EC tablet 40 mg  40 mg Oral Daily Hildred Priest, MD   40 mg at 12/26/14 1041  . sertraline (ZOLOFT) tablet 50 mg  50 mg Oral Daily Marjie Skiff, MD   50 mg at 12/26/14 1041  . traZODone (DESYREL) tablet 150 mg  150 mg Oral QHS Marjie Skiff, MD   150 mg at 12/25/14 2151    Lab Results:  No results found for this or any previous visit (from the past 83 hour(s)).  Physical Findings: AIMS: Facial and Oral Movements Muscles of Facial Expression:  None, normal Lips and Perioral Area: None, normal Jaw: None, normal Tongue: None, normal,Extremity Movements Upper (arms, wrists, hands, fingers): None, normal Lower (legs, knees, ankles, toes): None, normal, Trunk Movements Neck, shoulders, hips: None, normal, Overall Severity Severity of abnormal movements (highest score from questions above): None, normal Incapacitation due to abnormal movements: None, normal Patient's awareness of abnormal movements (rate only patient's report): No Awareness, Dental Status Current problems with teeth and/or dentures?: No Does patient usually wear dentures?: No  CIWA:    COWS:     Treatment Plan Summary: Daily contact with patient to assess and evaluate symptoms and progress in treatment and Medication management   49 year old African-American male with history of antisocial behavior, substance abuse, aggression and currently endorsing symptoms of depression as a result of losing his job and apartment. The patient feels people are not supportive to  him and he feels everybody is against him. Patient was quite defensive during assessment does not appear to have any insight into his aggressive behavior and justifies his actions. No evidence of psychosis, mania or hypomania. Patient has been is stating that due to his lack of support and his  sense that nobody cares about him and not having where to go he will just buy some pills and peacefully go to sleep.  Major depressive disorder: Will increase sertraline to 50 mg by mouth daily in order to address irritability, anger and depressive symptomatology.  Cocaine and cannabis alcohol abuse: Patient will benefit from substance abuse treatment at discharge.  Insomnia: Will increase his trazodone to 150 mg at bedtime as standing as it appears he has not been receiving this.  Tobacco use disorder: We will offer nicotine patch of 21 mg  Hypertension:will start low dose norvasc as BP is elevated  GERD: Patient  will be started on Protonix 40 mg by mouth daily  Inguinal abscess: started on keflex by surgery.  Foot calluses-Aquaphor ointment BID for two days.  Examination of his left hemiscrotum demonstrates a 2 cm x 3 cm area of induration but no evidence of cellulitis or purulent drainage. Per general surgery: Assessment: Resolving left hemiscrotal abscess I suspect this is a sebaceous cyst which will ultimately need to be excised in the near future.  Plan  Warm compress. Keflex was written. Have patient follow-up in our offices discharge.  Labs: TSH was within the normal limits. Hemoglobin A1c is pending. Lipid panel is within the normal limits, only showing a slightly decreased HDL. Appears to have chronic renal insufficiency. CBC within normal limits. All electrolytes were within the normal limits. UA and urine toxicology are still pending.  Discharge planning: Social worker is looking into options for disposition. His options are limited as he is a registered sex offender. Social worker has contacted a multitude of places w/o luck due to his charges.  Awaiting call from shelter in [facility] who accepts SO.  Medical Decision Making:  Established Problem, Stable/Improving (1)     Hildred Priest 12/26/2014, 4:54 PM

## 2014-12-26 NOTE — [facility] Group Notes (Signed)
[facility] LCSW Group Therapy  12/26/2014 5:08 PM  Type of Therapy:  Group Therapy  Participation Level:  Did Not Attend   Keene Breath, MSW, LCSWA 12/26/2014, 5:08 PM

## 2014-12-26 NOTE — Progress Notes (Signed)
D: Patient's mood is sad and anxious today. He states he is upset that he is to be discharged but has no where to live, no job or no transportation. He does express hope that his life will get better but says that sometimes he just wants to take pills and go to sleep. States he reqrets relapsing on cocaine but realizes he did it in desperation after being clean for 16 years. Denies active SI/HI/AVH.  A: On q 15 minute checks for safety. Offered emotional support. CSW working on placement options. Taking meds. R: Anxious. Continue to monitor.

## 2014-12-26 NOTE — Progress Notes (Signed)
D: Pt passive SI-contracts for safety.  denies HI/AVH. Pt is pleasant and cooperative. Pt stated he feels better about the boil on his groin region because the pain has gotten better. Pt stated he feels sad about his past due to doing a lot of bad things (gangs, drugs and ETOH). Pt stated he has turned the negatives in his life into positives. Pt stated even though he went to prison for 14 years he came out with a GED and college education and before he went in he only had a 3rd grade education. Pt stated he was having life stressors that got him in here, but pt appears to have real good insight into his Tx. Pt appears very positive even though he has had hardships and situations.   A: Pt was offered support and encouragement. Pt was given scheduled medications. Pt was encourage to attend groups. Q 15 minute checks were done for safety.   R:Pt attends groups and interacts well with peers and staff. Pt is taking medication. Pt has no complaints at this time .Pt receptive to treatment and safety maintained on unit.
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
## 2014-12-26 NOTE — Plan of Care (Signed)
Problem: Alteration in mood Goal: LTG-Patient reports reduction in suicidal thoughts (Patient reports reduction in suicidal thoughts and is able to verbalize a safety plan for whenever patient is feeling suicidal)  Outcome: Not Progressing Passive SI-contracts for safety Goal: LTG-Pt's behavior demonstrates decreased signs of depression (Patient's behavior demonstrates decreased signs of depression to the point the patient is safe to return home and continue treatment in an outpatient setting)  Outcome: Progressing Pt observed talking and interacting on the unit and giving advice and did not appear to be depressed.

## 2014-12-26 NOTE — Progress Notes (Signed)
Recreation Therapy Notes  Date: 09.12.16 Time: 3:00 pm Location: Craft Room  Group Topic: Self-expression  Goal Area(s) Addresses:  Patient will identify one color per emotion listed on wheel. Patient will verbalize benefit of using art as a means of self-expression. Patient will verbalize one emotion experienced during session. Patient will be educated on other forms of self-expression.  Behavioral Response: Did not attend  Intervention: Emotion Wheel  Activity: Patients were given a worksheet with 7 different emotions and were instructed to pick a color for each emotion.  Education: LRT educated patient on different forms of self-expression.   Education Outcome: Patient did not attend group.  Clinical Observations/Feedback: Patient did not attend group.  Leonette Monarch, LRT/CTRS 12/26/2014 4:29 PM

## 2014-12-27 MED ORDER — TRAZODONE HCL 150 MG PO TABS: 150.0000 mg | ORAL_TABLET | Freq: Every evening | ORAL | Status: DC | PRN

## 2014-12-27 MED ORDER — AMLODIPINE BESYLATE 2.5 MG PO TABS: 2.5000 mg | ORAL_TABLET | Freq: Every day | ORAL | Status: DC

## 2014-12-27 MED ORDER — PANTOPRAZOLE SODIUM 40 MG PO TBEC: 40.0000 mg | DELAYED_RELEASE_TABLET | Freq: Every day | ORAL | Status: DC

## 2014-12-27 MED ORDER — CEPHALEXIN 500 MG PO CAPS: 500.0000 mg | ORAL_CAPSULE | Freq: Four times a day (QID) | ORAL | Status: DC

## 2014-12-27 MED ORDER — SERTRALINE HCL 50 MG PO TABS: 50.0000 mg | ORAL_TABLET | Freq: Every day | ORAL | Status: DC

## 2014-12-27 NOTE — Progress Notes (Signed)
Recreation Therapy Notes  INPATIENT RECREATION TR PLAN  Patient Details Name: RAMSAY BOGNAR MRN: [id number] DOB: 05-09-1965 Today's Date: 12/27/2014  Rec Therapy Plan Is patient appropriate for Therapeutic Recreation?: Yes Treatment times per week: At least once a week TR Treatment/Interventions: 1:1 session, Group participation (Comment) (Appropriate participation in daily recreation therapy tx)  Discharge Criteria Pt will be discharged from therapy if:: Discharged Treatment plan/goals/alternatives discussed and agreed upon by:: Patient/family  Discharge Summary Short term goals set: See Care Plan Short term goals met: Complete Progress toward goals comments: One-to-one attended Which groups?: Leisure education, Coping skills One-to-one attended: Self-esteem, stress management, coping skills Reason goals not met: N/A Therapeutic equipment acquired: None Reason patient discharged from therapy: Discharge from hospital Pt/family agrees with progress & goals achieved: Yes Date patient discharged from therapy: 12/27/14   Leonette Monarch, LRT/CTRS 12/27/2014, 5:08 PM

## 2014-12-27 NOTE — BHH Suicide Risk Assessment (Addendum)
[facility] Discharge Suicide Risk Assessment   Demographic Factors:  Male, Low socioeconomic status, Living alone and Unemployed  Total Time spent with patient: 30 minutes   Psychiatric Specialty Exam: Physical Exam  ROS                                                         Have you used any form of tobacco in the last 30 days? (Cigarettes, Smokeless Tobacco, Cigars, and/or Pipes): Yes  Has this patient used any form of tobacco in the last 30 days? (Cigarettes, Smokeless Tobacco, Cigars, and/or Pipes) Yes, A prescription for an FDA-approved tobacco cessation medication was offered at discharge and the patient refused  Mental Status Per Nursing Assessment::   On Admission:  NA  Current Mental Status by Physician: Denies SI, HI or having auditory or visual hallucinations. Mood euthymic, affect reactive and appropriate.  Loss Factors: Legal issues and Financial problems/change in socioeconomic status  Historical Factors: Prior suicide attempts and Impulsivity  Risk Reduction Factors:   Religious beliefs about death  Continued Clinical Symptoms:  Depression:   Comorbid alcohol abuse/dependence Impulsivity Alcohol/Substance Abuse/Dependencies Personality Disorders:   Cluster B Comorbid alcohol abuse/dependence Previous Psychiatric Diagnoses and Treatments  Cognitive Features That Contribute To Risk:  None    Suicide Risk:  Minimal: No identifiable suicidal ideation.  Patients presenting with no risk factors but with morbid ruminations; may be classified as minimal risk based on the severity of the depressive symptoms  Principal Problem: Major depressive disorder, recurrent episode, moderate with anxious distress Discharge Diagnoses:  Patient Active Problem List   Diagnosis Date Noted  . Scrotal abscess [N49.2] 12/23/2014  . HTN (hypertension) [I10] 12/22/2014  . GERD (gastroesophageal reflux disease) [K21.9] 12/22/2014  . Tobacco use disorder  [Z72.0] 12/22/2014  . 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 Cannabis use disorder, severe, dependence [F12.20] 12/22/2014  . Alcohol use disorder, mild, abuse [F10.10] 12/22/2014  . Major depressive disorder, recurrent episode, moderate with anxious distress [F33.1] 12/22/2014  . Antisocial personality disorder [F60.2] 12/22/2014  . Hepatitis C (treated) [B19.20] 09/01/2013    Follow-up Information    Follow up with Sherri Rad, MD In 2 weeks.   Specialties:  Surgery, Radiology   Contact information:   [address] [place] [address] [phone]        Is patient on multiple antipsychotic therapies at discharge:  No   Has Patient had three or more failed trials of antipsychotic monotherapy by history:  No  Recommended Plan for Multiple Antipsychotic Therapies: NA    Hildred Priest 12/27/2014, 11:16 AM

## 2014-12-27 NOTE — [facility] Group Notes (Signed)
[facility] LCSW Group Therapy  12/27/2014 2:09 PM  Type of Therapy:  Group Therapy  Participation Level:  Did Not Attend   Keene Breath, MSW, LCSWA 12/27/2014, 2:09 PM

## 2014-12-27 NOTE — Progress Notes (Signed)
D:Patient aware of discharge this shift . Patient returning home . Patient received all belonging locked up . Patient denies  Suicidal  And homicidal ideations  .  A: Writer instructed on discharge criteria  . Informed of 7 day supply but wanted to leave without receiving  them    Aware  Of follow up appointment . R: Patient left unit with no questions  Or concerns  With his pastor

## 2014-12-27 NOTE — BHH Group Notes (Signed)
[place] Group Notes:  (Nursing/MHT/Case Management/Adjunct)  Date:  12/27/2014  Time:  2:07 PM  Type of Therapy:  Psychoeducational Skills  Participation Level:  Active  Participation Quality:  Appropriate  Affect:  Appropriate  Cognitive:  Appropriate  Insight:  Good  Engagement in Group:  Engaged  Modes of Intervention:  Discussion and Education  Summary of Progress/Problems:  Drake Leach 12/27/2014, 2:07 PM

## 2014-12-27 NOTE — Progress Notes (Signed)
D: Patient denies SI/HI/AVH.   Patient affect is depressed. Mood is anxious.  Patient did NOT attend evening group. Patient visible on the milieu. No distress noted. A: Support and encouragement offered. Scheduled medications given to pt. Q 15 min checks continued for patient safety. R: Patient receptive. Patient remains safe on the unit.

## 2015-03-08 ENCOUNTER — Telehealth: Payer: Self-pay | Admitting: Internal Medicine

## 2015-03-08 NOTE — Telephone Encounter (Signed)
Patient's fiance - Ethan [name] called for Ethan [name].  He would like for Dr. Amil Amen to call Ethan [name] Patient Assistance Program at [phone] to get refill of Rx - Virgara tab. 50 ml (sildenafil citrate).  Patient ID# is [id number].  Ethan [name] can be reached at [phone].

## 2015-03-20 NOTE — Telephone Encounter (Signed)
Patient calling regarding his Rx refill for Virgara tab. 50 ml.  Rx to Patient Assistance Program [phone].  Patient ID: [id number].  Ethan [name] may be reached at [phone] or [phone].

## 2015-03-22 NOTE — Telephone Encounter (Signed)
Viagra was written in April of this year with 11 refills, which should take him to April of 2017.   Usually the pharmaceutical company covers it for the full year as well. Need to clarify why he is out of refills--did they receive paperwork recommending that he needs a new prescription? If so, he needs to bring that paperwork in for me to see. I tried to call the pharmaceutical company, but the wait was too long to hold

## 2015-03-27 NOTE — Telephone Encounter (Signed)
Left message with male to have patient call back to our office.

## 2015-03-28 NOTE — Telephone Encounter (Signed)
Called spoke to [place] the prescription will be mailed out and the patient can not call for refills it must be the Physican's office only

## 2015-04-12 ENCOUNTER — Ambulatory Visit: Payer: Self-pay | Admitting: Internal Medicine

## 2015-04-14 ENCOUNTER — Telehealth: Payer: Self-pay | Admitting: Internal Medicine

## 2015-04-14 NOTE — Telephone Encounter (Signed)
Patient called. States Immunologist to inquire about his Viagra and was told that the medication had been received at our facility.  Patient states calling [phone].  Patient to be reach at [phone] to get update. Mechele Claude called to inquire about it.

## 2015-04-14 NOTE — Telephone Encounter (Signed)
Spoke with [employer] and Mr. [place] Rx shipped to the incorrect address they will fix this in their system. Reorder number is [id number]. I will check back next week with a status update

## 2015-04-24 IMAGING — CR DG CHEST 2V
2 series · 2 of 2 positions shown · non-contrast
Comparison: None.

CLINICAL DATA: Left-sided chest pain for 3 days. The pain radiates
to the neck and upper back.

EXAM:
CHEST  2 VIEW

[w chest pa]
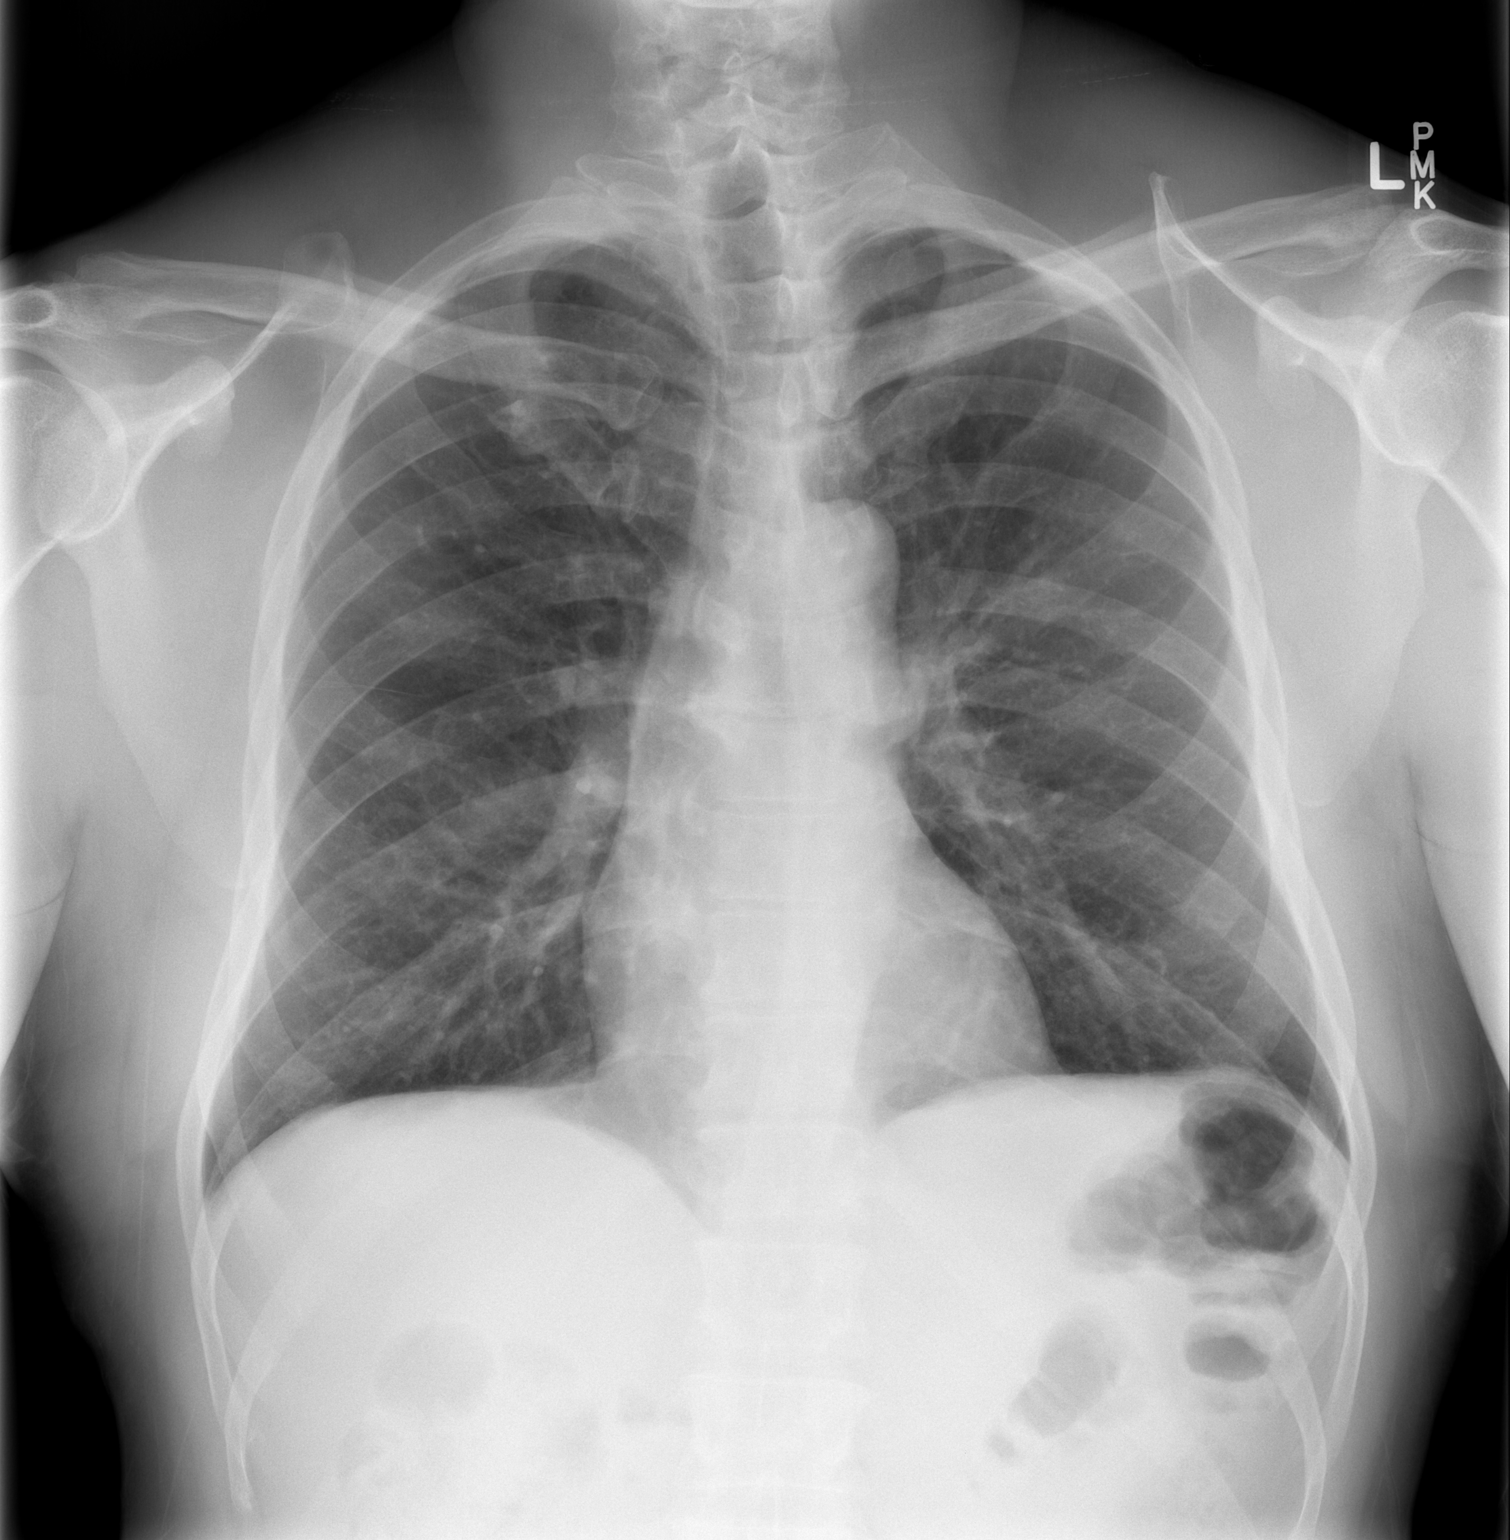

[w chest lat]
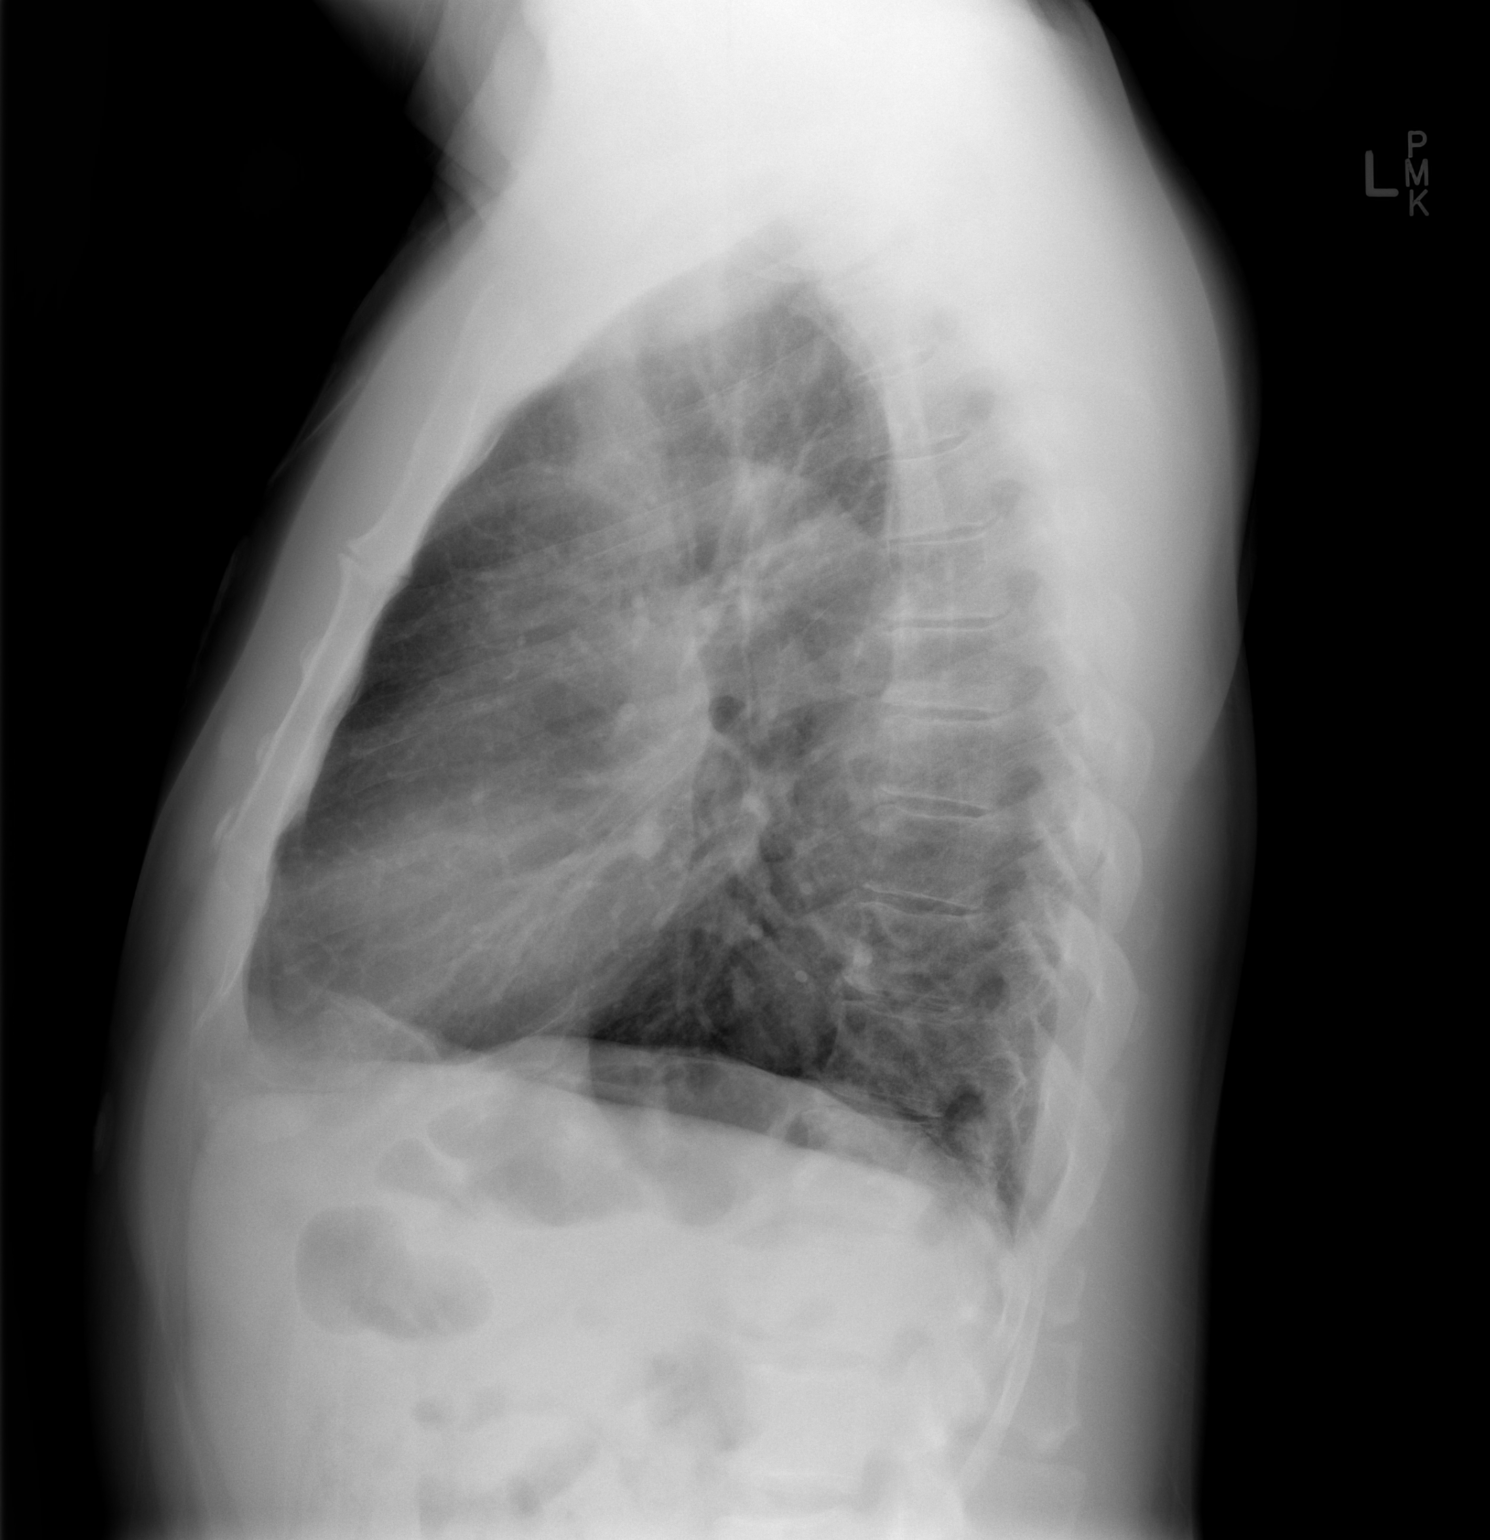

[2 of 2 positions shown; findings below may reference images not displayed]

FINDINGS: Heart size and pulmonary vascularity are normal and the lungs are
clear. No significant osseous abnormality. No effusions.
IMPRESSION: Normal chest.

## 2015-05-01 ENCOUNTER — Ambulatory Visit: Payer: Self-pay | Admitting: Internal Medicine

## 2015-05-09 ENCOUNTER — Telehealth: Payer: Self-pay | Admitting: Internal Medicine

## 2015-05-09 NOTE — Telephone Encounter (Signed)
Patient called stating he wants prescription refills on Nexium 40 mg and Omeprazole 40 mg [facility]. He states he takes both of these medications one for ulcer and the other for acid reflux. Patient has a current orange card and has a follow up appointment with Dr. Amil Amen on 05/23/15.  Please contact the patient at hm# [phone] or cell [phone]. Mechele Claude spoke with patient

## 2015-05-19 NOTE — Telephone Encounter (Signed)
Patient will go get the medication at [facility] . Patient states having an up to date [employer]

## 2015-05-23 ENCOUNTER — Ambulatory Visit: Payer: Self-pay | Admitting: Internal Medicine

## 2015-06-07 ENCOUNTER — Other Ambulatory Visit: Payer: Self-pay | Admitting: Internal Medicine

## 2015-06-07 DIAGNOSIS — K219 Gastro-esophageal reflux disease without esophagitis: Secondary | ICD-10-CM

## 2015-06-07 MED ORDER — OMEPRAZOLE 20 MG PO CPDR: DELAYED_RELEASE_CAPSULE | ORAL | Status: DC

## 2015-06-09 NOTE — Progress Notes (Signed)
Patient informed and will call back to set up follow up appointment

## 2015-06-26 ENCOUNTER — Telehealth: Payer: Self-pay | Admitting: Internal Medicine

## 2015-06-26 NOTE — Telephone Encounter (Signed)
Called and reorder patient medication it should take 10 business days order [id number]. Spoke with patient and explained we will call him back once medication arrives

## 2015-06-26 NOTE — Telephone Encounter (Addendum)
Patient called to request refill of Viagra  50 mg. Tab, from [place].  Wants Dr. Amil Amen to call in Rx.  Patient telephone number if needed is,  [phone].  Telephone # for [employer] [phone].  Patient ID: [id number].

## 2015-06-26 NOTE — Telephone Encounter (Signed)
C-

## 2015-06-30 NOTE — Telephone Encounter (Signed)
Patient informed about medication and will try to come in today if he is able to find a ride.

## 2015-06-30 NOTE — Telephone Encounter (Signed)
Medication for patient arrived 06/29/15. Left voicemail for patient to return call

## 2015-06-30 NOTE — Telephone Encounter (Signed)
Patient came in to pick up medication today.

## 2015-07-17 ENCOUNTER — Ambulatory Visit (INDEPENDENT_AMBULATORY_CARE_PROVIDER_SITE_OTHER): Payer: Medicaid Other | Admitting: Internal Medicine

## 2015-07-17 ENCOUNTER — Encounter: Payer: Self-pay | Admitting: Internal Medicine

## 2015-07-17 VITALS — BP 130/80 | HR 74 | Resp 20 | Ht 68.0 in | Wt 200.5 lb

## 2015-07-17 DIAGNOSIS — R350 Frequency of micturition: Secondary | ICD-10-CM

## 2015-07-17 DIAGNOSIS — N3941 Urge incontinence: Secondary | ICD-10-CM

## 2015-07-17 DIAGNOSIS — L0292 Furuncle, unspecified: Secondary | ICD-10-CM | POA: Diagnosis not present

## 2015-07-17 DIAGNOSIS — K219 Gastro-esophageal reflux disease without esophagitis: Secondary | ICD-10-CM

## 2015-07-17 MED ORDER — AMOXICILLIN-POT CLAVULANATE 875-125 MG PO TABS: 1.0000 | ORAL_TABLET | Freq: Two times a day (BID) | ORAL | Status: DC

## 2015-07-17 MED ORDER — ESOMEPRAZOLE MAGNESIUM 40 MG PO CPDR: DELAYED_RELEASE_CAPSULE | ORAL | Status: DC

## 2015-07-17 NOTE — Progress Notes (Signed)
   Subjective:    Patient ID: Ethan [name], male    DOB: [birth year], 50 y.o.   MRN: [id number] Patient here after not being seen for some time.  Gives circuitous story as to why he is not taking medications prescribed by other providers. HPI   1.  Urinary Frequency:  Constantly using the bathroom.  Does have a good stream.  No hesitancy.  No worse at night than during day.  Does have urinary urge incontinence.  Drinking a lot of green tea.  Does not drink soda, which has been a problem for him regarding this in the past.  Has been on Oxybutynin in the past. May have helped slightly.   Has never had urinary bladder volumes and pressures checked in past. We have seen him for this in the past.  UA is always normal  2.  GERD/Epigastric pain:  Has been buying otc Nexium 40 mg and taking daily.  Does not necessarily take on an empty stomach. If misses two days of medication, has acid into his throat and epigastric pain.  No melena or hematochezia.    3.  Getting boils or bites on extremities for past 5 months.  Was given antibiotics in [place] when hospitalized for depression.  Was seen at [facility] and sent to [facility].  The antibiotics helped, but boils started back up again.  Cannot pin down history with patient.  Later, sounds like a problem for about 5 months here recently.  Mainly getting the lesions on his buttocks and groin.  4.  Health Maintenance:  Will be happy to make a CPE to get his screening up to date.  Gives several stories as to why he could not get his meds.  Reeducated on orange card and that it is not insurance.  Has Medicaid now due to disability  Meds Nexium 40 mg in the morning. Viagra 50 mg once daily as needed  No Known Allergies   Review of Systems     Objective:   Physical Exam  Lungs:  CTA CV: RRR without murmur or rub, radial pulses normal and equal. Abd:  S NT, No HSM or mass, resonant to percussion over suprapubic area. Skin:  Thickening/hardening deep  into skin with small pustule of right inner forearm.  Old appearing.  3 mc thickening in right scrotum skin about several hair follicles.  No fluctuance or pustular head.  No current erythema      Assessment & Plan:  1.  GERD:  Restart Nexium.  Discussed taking it on empty stomach and not with other meds.  2.  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  Urinary urgency and incontinence:  Patient was unable to get a Urology referral when on orange card.  Really feel his bladder volumes an voiding ability need to be evaluated.  The one thing that has helped in the past was to stop sodas.  Oxybutynin has had minimal effect.  Prostate exam was not supportive of BPH, nor is history really.  Refer to Urology.

## 2015-07-24 ENCOUNTER — Encounter: Payer: Self-pay | Admitting: Gastroenterology

## 2015-08-04 ENCOUNTER — Telehealth: Payer: Self-pay | Admitting: Internal Medicine

## 2015-08-04 NOTE — Telephone Encounter (Signed)
Patient's fiance called today stating patient's CPE in until August and would like that to be rescheduled to sooner date because patient is still having problems with the urination.  Patient has been to the Urologist and has to go back for testing 09/05/15 and then again on 09/14/15 to get test results. Can patient have CPE appointment in June after test results or needs to wait until 11/16/15 when his appointment is?

## 2015-08-07 NOTE — Telephone Encounter (Signed)
Pat called number listed for patient several times - call would not complete as dialed.

## 2015-08-10 NOTE — Telephone Encounter (Signed)
Called and spoke to patient today. Patient states he requested to have CPE sooner due to his urinating problems. Patient informed of what the plan is and is going to keep CPE appointment in August.

## 2015-08-25 ENCOUNTER — Telehealth: Payer: Self-pay | Admitting: Internal Medicine

## 2015-08-25 NOTE — Telephone Encounter (Signed)
Patient called requesting refill for Viagra

## 2015-08-28 ENCOUNTER — Telehealth: Payer: Self-pay | Admitting: Internal Medicine

## 2015-08-28 DIAGNOSIS — N529 Male erectile dysfunction, unspecified: Secondary | ICD-10-CM

## 2015-08-28 NOTE — Telephone Encounter (Signed)
Patient calling to check on refill for Rx Virgara tab 50 ml.

## 2015-08-28 NOTE — Telephone Encounter (Signed)
Patient wants to know if Dr. Has called in his Rx for Virgara tab 50 ml.  Rx is to be called into Patient Assistance Program [phone].  He is out of Rx and thought it was to be called in by or before May 11th is why he is checking.  Patient can be reached at [phone] or on cell at [phone].

## 2015-08-28 NOTE — Telephone Encounter (Signed)
Please let pt. Know he needs to let us know when his medication is due to be called in.  We do not keep track of that for him.  We generally do not do the patient assistance for Viagra.  He needs to let us know a week before it is due to end as to when it needs to be called in.

## 2015-08-29 MED ORDER — SILDENAFIL CITRATE 50 MG PO TABS: 50.0000 mg | ORAL_TABLET | Freq: Every day | ORAL | Status: DC | PRN

## 2015-08-29 NOTE — Telephone Encounter (Signed)
Patient can have Rx billed via Medicaid.  He would like Viagra called into [facility] - [place], [place] [address] At [facility].

## 2015-09-01 NOTE — Telephone Encounter (Signed)
Patient called Ethan [name] and was informed that they just need a new prescription from Dr. Amil Amen to be sent for patient. Patient does not need to fill out a new application.

## 2015-09-01 NOTE — Telephone Encounter (Signed)
Have addressed this in another message--pt. Needs to call [facility] and get the necessary info regarding getting him a refill.

## 2015-09-01 NOTE — Telephone Encounter (Signed)
Patient called this morning stating Medicaid does not pay for Viagra 50 MG tablet and was unable to get it from the [facility]. Patient called [facility] and was informed that he can still get the Viagra from them even if he has Medicaid. Now patient wants Dr. Amil Amen to call in the refills to [facility].

## 2015-09-04 NOTE — Telephone Encounter (Signed)
Patient's friend, Ms. Moore called - said to please have Dr. Amil Amen call [facility] to have his  Rx refilled.  The number is [phone].  She would need to give his date of birth.

## 2015-09-05 NOTE — Telephone Encounter (Signed)
Closing this note as we have 2 notes going for the same question

## 2015-09-05 NOTE — Telephone Encounter (Signed)
Patient called to give Dr. Amil Amen another number for [facility].  The number to call is [phone].

## 2015-09-05 NOTE — Telephone Encounter (Signed)
Called and left a message at the phone number she called in.  For some reason, they are not currently open.  Let them know we will notify them once [place] gets back with [place].

## 2015-09-07 NOTE — Telephone Encounter (Signed)
Please check with patient in morning to see if he has anything on his previous paperwork for [employer] that lists what his "patient identifier" is.  I called and was almost through when was asked for this.  I do not know what they are asking for and there was noone to clarify at the time at [employer].

## 2015-09-08 ENCOUNTER — Other Ambulatory Visit: Payer: Self-pay | Admitting: Internal Medicine

## 2015-09-08 DIAGNOSIS — N529 Male erectile dysfunction, unspecified: Secondary | ICD-10-CM

## 2015-09-08 MED ORDER — SILDENAFIL CITRATE 50 MG PO TABS: 50.0000 mg | ORAL_TABLET | Freq: Every day | ORAL | Status: DC | PRN

## 2015-09-08 NOTE — Telephone Encounter (Signed)
Ethan [name] called again with frustration about his Viagra.  Discussed again, I did not have all the information and needed something called a patient identifier, which we do not have to complete the refill.  Discussed this is not something we do regularly and he needs to be in charge of providing [place] with all the information necessary to easily take care of this for him.   He stated he would check his paperwork and call in the patient identifier and would find another physician. Patient Identifier called in for Ethan [name] for Ethan [name];  [id number]. I called in an [name] for a bottle of 30 pills Viagra 50 mg. Please notify pt. It should be received in 7-10 days according to [employer]

## 2015-09-08 NOTE — Telephone Encounter (Signed)
Patient notified

## 2015-09-14 ENCOUNTER — Telehealth: Payer: Self-pay

## 2015-09-14 ENCOUNTER — Telehealth: Payer: Self-pay | Admitting: Internal Medicine

## 2015-09-14 NOTE — Telephone Encounter (Signed)
Medication received today.

## 2015-09-14 NOTE — Telephone Encounter (Signed)
Patient came in to pick up medication

## 2015-09-14 NOTE — Telephone Encounter (Signed)
Patient informed medication has arrived today.

## 2015-09-22 ENCOUNTER — Encounter: Payer: Self-pay | Admitting: Gastroenterology

## 2015-09-22 ENCOUNTER — Ambulatory Visit (INDEPENDENT_AMBULATORY_CARE_PROVIDER_SITE_OTHER): Payer: Medicaid Other | Admitting: Gastroenterology

## 2015-09-22 VITALS — BP 104/78 | HR 72 | Ht 68.25 in | Wt 197.4 lb

## 2015-09-22 DIAGNOSIS — R1310 Dysphagia, unspecified: Secondary | ICD-10-CM

## 2015-09-22 DIAGNOSIS — Z1211 Encounter for screening for malignant neoplasm of colon: Secondary | ICD-10-CM | POA: Diagnosis not present

## 2015-09-22 DIAGNOSIS — K219 Gastro-esophageal reflux disease without esophagitis: Secondary | ICD-10-CM

## 2015-09-22 MED ORDER — NA SULFATE-K SULFATE-MG SULF 17.5-3.13-1.6 GM/177ML PO SOLN: ORAL | Status: DC

## 2015-09-22 NOTE — Progress Notes (Signed)
HPI: This is a    very pleasant 50 year old man    who was referred to me by Mack Hook, MD to evaluate  chronic GERD, intermittent dysphasia .    Chief complaint is chronic GERD, intermittent dysphasia, routine risk for colon cancer  Takes nexium for about 10 years.  40mg  pill, in AM tries to take it before.  Without it he has a stabbing sensation in epigastrium, burning in chest.  Has dysphagia intermittently (only when he doesn't take nexium).  Overall his weight has been stable.    No overt GI bleeding.  He thinks he had EGD > 10 years ago, ulcers in stomach were noticed, per patient.  Rare NSAIDs.  Former alcoholic, back to drinking 80 oz beer daily.  He has never had colon cancer screening that he is aware of  Review of systems: Pertinent positive and negative review of systems were noted in the above HPI section. Complete review of systems was performed and was otherwise normal.   Past Medical History  Diagnosis Date  . Hepatitis C   . Hypertension   . GERD (gastroesophageal reflux disease)   . Alcoholism ([place])   . Anxiety   . Depression   . Peptic ulcer   . Sleep apnea     Past Surgical History  Procedure Laterality Date  . Inguinal hernia repair Bilateral   . Appendectomy      Current Outpatient Prescriptions  Medication Sig Dispense Refill  . esomeprazole (NEXIUM) 40 MG capsule 1 capsule by mouth on empty stomach 1 hour before breakfast or at bedtime, 2 hours after eating. 30 capsule 11  . sildenafil (VIAGRA) 50 MG tablet Take 1 tablet (50 mg total) by mouth daily as needed for erectile dysfunction. 30 tablet 5   No current facility-administered medications for this visit.    Allergies as of 09/22/2015  . (No Known Allergies)    Family History  Problem Relation Age of Onset  . Breast cancer Sister   . Breast cancer Maternal Grandmother   . Colon polyps Brother   . Diabetes Sister     x 2  . Diabetes Maternal Aunt   . Diabetes Paternal Aunt      Social History   Social History  . Marital Status: Married    Spouse Name: N/A  . Number of Children: 3  . Years of Education: GED   Occupational History  . Disability-vision    Social History Main Topics  . Smoking status: Current Every Day Smoker -- 0.50 packs/day    Types: Cigarettes    Start date: 07/14/1976  . Smokeless tobacco: Never Used     Comment: Stopped for 10 years.  Restarted with bout of depression/anxiety recently  . Alcohol Use: 0.0 oz/week    0 Standard drinks or equivalent per week     Comment: 3 beers nightly  . Drug Use: Yes    Special: "Crack" cocaine     Comment: Occassionally MJ currently--in past crack cocaine--more than 16 years ago.  [name] Sexual Activity:    Partners: Female     Comment: WIFE   Other Topics Concern  . Not on file   Social History Narrative   Lost job and schooling secondary to "past"   Apparently for what he was incarcerated for.     Physical Exam: BP 104/78 mmHg  Pulse 72  Ht 5' 8.25" (1.734 m)  Wt 197 lb 6 oz (89.529 kg)  BMI 29.78 kg/m2 Constitutional: generally well-appearing Psychiatric: alert  and oriented x3 Eyes: extraocular movements intact Mouth: oral pharynx moist, no lesions Neck: supple no lymphadenopathy Cardiovascular: heart regular rate and rhythm Lungs: clear to auscultation bilaterally Abdomen: soft, nontender, nondistended, no obvious ascites, no peritoneal signs, normal bowel sounds Extremities: no lower extremity edema bilaterally Skin: no lesions on visible extremities   Assessment and plan: 50 y.o. male with  Chronic GERD with intermittent dysphasia; routine risk for colon cancer  First I recommended that he try to take his Nexium at the correct time and relation to his breakfast meal. That is the way the pill is designed to work most effectively. I recommended we proceed with EGD for his chronic GERD and intermittent dysphasia alarm symptoms. At the same time I'm happy to do colonoscopy for  colon cancer screening since he has never had that done.   Ethan Loffler, MD [facility] 09/22/2015, 9:26 AM  Cc: Amil Amen

## 2015-09-22 NOTE — Patient Instructions (Addendum)
You should change the way you are taking your antiacid medicine (nexium) so that you are taking it 20-30 minutes prior to a decent meal as that is the way the pill is designed to work most effectively. Alcohol and smoking can make GERD, acid symptoms worse.  You have been scheduled for an endoscopy and colonoscopy. Please follow the written instructions given to you at your visit today. Please pick up your prep supplies at the pharmacy within the next 1-3 days. If you use inhalers (even only as needed), please bring them with you on the day of your procedure. Your physician has requested that you go to www.startemmi.com and enter the access code given to you at your visit today. This web site gives a general overview about your procedure. However, you should still follow specific instructions given to you by our office regarding your preparation for the procedure.

## 2015-10-07 ENCOUNTER — Encounter: Payer: Self-pay | Admitting: Internal Medicine

## 2015-10-18 ENCOUNTER — Ambulatory Visit (AMBULATORY_SURGERY_CENTER): Payer: Medicaid Other | Admitting: Gastroenterology

## 2015-10-18 ENCOUNTER — Encounter: Payer: Self-pay | Admitting: Gastroenterology

## 2015-10-18 VITALS — BP 118/83 | HR 78 | Temp 98.2°F | Resp 15 | Ht 68.0 in | Wt 197.0 lb

## 2015-10-18 DIAGNOSIS — D125 Benign neoplasm of sigmoid colon: Secondary | ICD-10-CM

## 2015-10-18 DIAGNOSIS — K635 Polyp of colon: Secondary | ICD-10-CM | POA: Diagnosis not present

## 2015-10-18 DIAGNOSIS — Z1211 Encounter for screening for malignant neoplasm of colon: Secondary | ICD-10-CM | POA: Diagnosis not present

## 2015-10-18 DIAGNOSIS — K219 Gastro-esophageal reflux disease without esophagitis: Secondary | ICD-10-CM | POA: Diagnosis not present

## 2015-10-18 MED ORDER — SODIUM CHLORIDE 0.9 % IV SOLN: 500.0000 mL | INTRAVENOUS | Status: DC

## 2015-10-18 NOTE — Progress Notes (Signed)
Report to PACU, RN, vss, BBS= Clear.  

## 2015-10-18 NOTE — Progress Notes (Signed)
No problems noted in the recovery room. maw 

## 2015-10-18 NOTE — Op Note (Signed)
[facility] Patient Name: Ethan [name] Procedure Date: 10/18/2015 3:00 PM MRN: [id number] Endoscopist: Milus Banister , MD Age: 50 Referring MD:  Date of Birth: 1966/02/12 Gender: Male Account #: [id number] Procedure:                Upper GI endoscopy Indications:              Dysphagia, Heartburn (both symptoms well controlled                            as long as he takes nexium daily) Medicines:                Monitored Anesthesia Care Procedure:                Pre-Anesthesia Assessment:                           - Prior to the procedure, a History and Physical                            was performed, and patient medications and                            allergies were reviewed. The patient's tolerance of                            previous anesthesia was also reviewed. The risks                            and benefits of the procedure and the sedation                            options and risks were discussed with the patient.                            All questions were answered, and informed consent                            was obtained. Prior Anticoagulants: The patient has                            taken no previous anticoagulant or antiplatelet                            agents. ASA Grade Assessment: II - A patient with                            mild systemic disease. After reviewing the risks                            and benefits, the patient was deemed in                            satisfactory condition to undergo the procedure.  After obtaining informed consent, the endoscope was                            passed under direct vision. Throughout the                            procedure, the patient's blood pressure, pulse, and                            oxygen saturations were monitored continuously. The                            Model GIF-HQ190 [id number]) scope was introduced                            through the mouth,  and advanced to the second part                            of duodenum. The upper GI endoscopy was                            accomplished without difficulty. The patient                            tolerated the procedure well. Scope In: Scope Out: Findings:                 A medium-sized hiatal hernia was present.                           The exam was otherwise without abnormality. Complications:            No immediate complications. Estimated blood loss:                            None. Estimated Blood Loss:     Estimated blood loss: none. Impression:               - Medium-sized hiatal hernia.                           - The examination was otherwise normal.                           - No specimens collected. Recommendation:           - Patient has a contact number available for                            emergencies. The signs and symptoms of potential                            delayed complications were discussed with the                            patient. Return to normal activities tomorrow.  Written discharge instructions were provided to the                            patient.                           - Resume previous diet.                           - Continue present medications.                           - No repeat upper endoscopy. Milus Banister, MD 10/18/2015 3:32:19 PM This report has been signed electronically.

## 2015-10-18 NOTE — Patient Instructions (Signed)

## 2015-10-18 NOTE — Progress Notes (Signed)
Pt requested samples of Nexium 40 mg.  Randall Hiss, RN went down stairs and brought up samples of Nexium 24 hr 22.3 mg.  Pt was told to take 2 of these pills at a time daily.  Pt thanked [place] for the samples ans states he understands he needs to take 2 pills daily.  maw

## 2015-10-18 NOTE — Progress Notes (Signed)
We are waiting for [facility] to call when they arrive here to pick of pt. maw

## 2015-10-18 NOTE — Op Note (Signed)
[place] Patient Name: Ethan [name] Procedure Date: 10/18/2015 3:00 PM MRN: [id number] Endoscopist: Milus Banister , MD Age: 50 Referring MD:  Date of Birth: February 13, 1966 Gender: Male Account #: [id number] Procedure:                Colonoscopy Indications:              Screening for colorectal malignant neoplasm Medicines:                Monitored Anesthesia Care Procedure:                Pre-Anesthesia Assessment:                           - Prior to the procedure, a History and Physical                            was performed, and patient medications and                            allergies were reviewed. The patient's tolerance of                            previous anesthesia was also reviewed. The risks                            and benefits of the procedure and the sedation                            options and risks were discussed with the patient.                            All questions were answered, and informed consent                            was obtained. Prior Anticoagulants: The patient has                            taken no previous anticoagulant or antiplatelet                            agents. ASA Grade Assessment: II - A patient with                            mild systemic disease. After reviewing the risks                            and benefits, the patient was deemed in                            satisfactory condition to undergo the procedure.                           After obtaining informed consent, the colonoscope  was passed under direct vision. Throughout the                            procedure, the patient's blood pressure, pulse, and                            oxygen saturations were monitored continuously. The                            Model CF-HQ190L [id number]) scope was introduced                            through the anus and advanced to the the cecum,                            identified by  appendiceal orifice and ileocecal                            valve. The colonoscopy was performed without                            difficulty. The patient tolerated the procedure                            well. The quality of the bowel preparation was                            excellent. The ileocecal valve, appendiceal                            orifice, and rectum were photographed. Scope In: 3:09:43 PM Scope Out: 3:22:31 PM Scope Withdrawal Time: 0 hours 10 minutes 32 seconds  Total Procedure Duration: 0 hours 12 minutes 48 seconds  Findings:                 A 3 mm polyp was found in the sigmoid colon. The                            polyp was sessile. The polyp was removed with a                            cold snare. Resection and retrieval were complete.                           The exam was otherwise without abnormality on                            direct and retroflexion views. Complications:            No immediate complications. Estimated blood loss:                            None. Estimated Blood Loss:     Estimated blood loss: none. Impression:               -  One 3 mm polyp in the sigmoid colon, removed with                            a cold snare. Resected and retrieved.                           - The examination was otherwise normal on direct                            and retroflexion views. Recommendation:           - Patient has a contact number available for                            emergencies. The signs and symptoms of potential                            delayed complications were discussed with the                            patient. Return to normal activities tomorrow.                            Written discharge instructions were provided to the                            patient.                           - Resume previous diet.                           - Continue present medications.                           You will receive a letter within 2-3  weeks with the                            pathology results and my final recommendations.                           If the polyp(s) is proven to be 'pre-cancerous' on                            pathology, you will need repeat colonoscopy in 5                            years. If the polyp(s) is NOT 'precancerous' on                            pathology then you should repeat colon cancer                            screening in 10 years with colonoscopy without need  for colon cancer screening by any method prior to                            then (including stool testing). Milus Banister, MD 10/18/2015 3:24:32 PM This report has been signed electronically.

## 2015-10-18 NOTE — Progress Notes (Signed)
Called to room to assist during endoscopic procedure.  Patient ID and intended procedure confirmed with present staff. Received instructions for my participation in the procedure from the performing physician.  

## 2015-10-19 ENCOUNTER — Telehealth: Payer: Self-pay

## 2015-10-19 NOTE — Telephone Encounter (Signed)
  Follow up Call-  Call back number 10/18/2015  Post procedure Call Back phone  # [phone]  Permission to leave phone message Yes     Patient questions:  Do you have a fever, pain , or abdominal swelling? No. Pain Score  0 *  Have you tolerated food without any problems? Yes.    Have you been able to return to your normal activities? Yes.    Do you have any questions about your discharge instructions: Diet   No. Medications  No. Follow up visit  No.  Do you have questions or concerns about your Care? No.  Actions: * If pain score is 4 or above: No action needed, pain <4.

## 2015-10-24 ENCOUNTER — Encounter: Payer: Self-pay | Admitting: Gastroenterology

## 2015-10-30 ENCOUNTER — Telehealth: Payer: Self-pay | Admitting: Internal Medicine

## 2015-10-30 NOTE — Telephone Encounter (Signed)
Patient would like Rx refill for Viagra 50 mg. Tab.  Patient ID#: [id number] with [facility].  Patient can be reached [phone].

## 2015-10-30 NOTE — Telephone Encounter (Signed)
Order placed. The earliest it can be filled is 11-11-2015 and will be processed that day. Unable to hear the entire order number.

## 2015-11-16 ENCOUNTER — Encounter: Payer: Self-pay | Admitting: Internal Medicine

## 2015-11-16 ENCOUNTER — Ambulatory Visit (INDEPENDENT_AMBULATORY_CARE_PROVIDER_SITE_OTHER): Payer: Medicaid Other | Admitting: Internal Medicine

## 2015-11-16 VITALS — BP 128/82 | HR 74 | Resp 18 | Ht 68.5 in | Wt 191.0 lb

## 2015-11-16 DIAGNOSIS — B070 Plantar wart: Secondary | ICD-10-CM

## 2015-11-16 DIAGNOSIS — Z0000 Encounter for general adult medical examination without abnormal findings: Secondary | ICD-10-CM | POA: Diagnosis not present

## 2015-11-16 DIAGNOSIS — F3160 Bipolar disorder, current episode mixed, unspecified: Secondary | ICD-10-CM | POA: Diagnosis not present

## 2015-11-16 DIAGNOSIS — N529 Male erectile dysfunction, unspecified: Secondary | ICD-10-CM | POA: Diagnosis not present

## 2015-11-16 MED ORDER — SILDENAFIL CITRATE 50 MG PO TABS: ORAL_TABLET | ORAL | 5 refills | Status: DC

## 2015-11-16 NOTE — Progress Notes (Signed)
Subjective:    Patient ID: Ethan [name], male    DOB: May 08, 1965, 50 y.o.   MRN: [id number]  HPI   Here for CPE.    Health Maintenance:  1.  PSA:  Cannot say he has ever had this tested.  Not clear if he had bloodwork done at Urology recently.  Dr. Phil Dopp is his urologist  2.  Colonoscopy and EGD:  July 5th, 2017:  With Dr. Owens Loffler.  Had 1 small hyperplastic polyp.  To follow up with colonoscopy in 10 years.  Also, EGD showed moderate Hiatal hernia.  Taking Nexium.  Changed diet and feeling better.  3.  STE:  No.  4. Immunizations: Had last Td in prison, not clear which type, about 4 years ago.   No influenza   Immunization History  Administered Date(s) Administered  . Td 09/14/2011      Review of Systems  Constitutional: Negative for fatigue and fever.  HENT: Positive for sinus pressure. Negative for hearing loss.   Eyes: Positive for visual disturbance.       Disability for optic atrophy bilaterally Follows with Len Blalock, O.D. At [facility]  Respiratory: Negative for chest tightness and shortness of breath.   Cardiovascular: Negative for chest pain, palpitations and leg swelling.  Gastrointestinal: Negative for abdominal pain, blood in stool, constipation, diarrhea and nausea.  Endocrine: Negative for polyphagia and polyuria.  Genitourinary: Positive for frequency and urgency. Negative for discharge, dysuria, penile pain, penile swelling, scrotal swelling and testicular pain.  Musculoskeletal: Negative for arthralgias and back pain.  Skin: Negative for rash.       No lesions changing size, color, bleeding, itching, flaking.  Neurological: Negative for weakness, numbness and headaches.  Hematological: Negative for adenopathy. Does not bruise/bleed easily.  Psychiatric/Behavioral: Positive for hallucinations and sleep disturbance. Negative for suicidal ideas.       Bipolar Disorder/Schizophrenia per patient.  Followed at [facility]. Wants to transfer to  [place]. Feels Seroquel is too powerful and does not stay on it.  Stopped taking it 2 months ago.     Current Meds  Medication Sig  . esomeprazole (NEXIUM) 40 MG capsule 1 capsule by mouth on empty stomach 1 hour before breakfast or at bedtime, 2 hours after eating.  . Mirabegron (MYRBETRIQ PO) Take 50 mg by mouth daily. Dr. Phil Dopp, Urology  . QUEtiapine (SEROQUEL XR) 400 MG 24 hr tablet Take 400 mg by mouth at bedtime.  . sildenafil (VIAGRA) 50 MG tablet 1 tab by mouth once every 24 hours as needed for activity  . [DISCONTINUED] sildenafil (VIAGRA) 50 MG tablet Take 1 tablet (50 mg total) by mouth daily as needed for erectile dysfunction.    No Known Allergies   Past Medical History:  Diagnosis Date  . Alcoholism ([place])   . Anxiety   . Depression   . GERD (gastroesophageal reflux disease)   . Heart murmur   . Hepatitis C    Treated in prison for 18 months.  Sounds like Ribaviron and Interferon  2009-2011  . Hypertension   . Peptic ulcer   . Substance abuse   . Urge urinary incontinence    07/20/2015:  evaluated by Dr. Kathie Rhodes, Urodynamics planned   Past Surgical History:  Procedure Laterality Date  . APPENDECTOMY    . INGUINAL HERNIA REPAIR Bilateral    Family History  Problem Relation Age of Onset  . Breast cancer Sister   . Breast cancer Maternal Grandmother   . Colon  polyps Brother   . Diabetes Sister     x 2  . Diabetes Maternal Aunt   . Diabetes Paternal Aunt   . Diabetes Mother     DM complications cause of death  . Alcohol abuse Father   . COPD Neg Hx    Social History   Social History  . Marital status: Significant Other    Spouse name: N/A  . Number of children: 3  . Years of education: GED   Occupational History  . 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 [facility]   Social History Main Topics  . Smoking status: Current Every Day Smoker    Packs/day: 0.50    Years: 12.00    Types: Cigarettes    Start date: 07/14/1976  .  Smokeless tobacco: Never Used     Comment: Stopped for 16 years.  Restarted with bout of depression/anxiety recently  . Alcohol use 0.0 oz/week     Comment: 3 beers nightly  . Drug use:     Types: "Crack" cocaine     Comment: Occassionally MJ currently--in past crack cocaine--more than 16 years ago.  [name] Sexual activity: Yes    Partners: Female    Birth control/ protection: None     Comment: WIFE USED MAJUANA SATURDAY   Other Topics Concern  . Not on file   Social History Narrative   Lost job and schooling secondary to "past"   Apparently for what he was incarcerated for.       Objective:   Physical Exam  Constitutional: He is oriented to person, place, and time. He appears well-developed and well-nourished.  HENT:  Head: Normocephalic and atraumatic.  Right Ear: External ear normal.  Left Ear: External ear normal.  Nose: Nose normal.  Mouth/Throat: Oropharynx is clear and moist.  Eyes: Conjunctivae and EOM are normal. Pupils are equal, round, and reactive to light.  Discs with bright white changes to optic nerve area.  Neck: Normal range of motion. Neck supple. No thyroid mass and no thyromegaly present.  Cardiovascular: Normal rate, regular rhythm, S1 normal, S2 normal and normal pulses.  Exam reveals no S3, no S4 and no friction rub.   No murmur heard. Pulmonary/Chest: Effort normal and breath sounds normal.  Abdominal: Soft. Bowel sounds are normal. He exhibits no mass. There is no hepatosplenomegaly. There is no tenderness. No hernia. Hernia confirmed negative in the right inguinal area and confirmed negative in the left inguinal area.  Genitourinary: Testes normal and penis normal.  Musculoskeletal: Normal range of motion.  Lymphadenopathy:       Head (right side): No submental and no submandibular adenopathy present.       Head (left side): No submental and no submandibular adenopathy present.    He has no cervical adenopathy.    He has no axillary adenopathy.        Right: No inguinal and no supraclavicular adenopathy present.       Left: No inguinal and no supraclavicular adenopathy present.  Neurological: He is alert and oriented to person, place, and time. He has normal strength and normal reflexes. No cranial nerve deficit or sensory deficit. Coordination and gait normal.  Skin: Skin is warm and dry.  Psychiatric: He has a normal mood and affect. His speech is normal and behavior is normal. Thought content normal.          Assessment & Plan:  1.  Annual Exam, Adult male Screening colonoscopy up to date with Dr.  Ardis Hughs. Recommend calling later in fall for influenza vaccine. Return for fasting labs:  FLP, CBC, CMP, A1C   2.  Urinary complaints:  As per Urology  3.  Corns or plantar warts of feet:  Referral to Podiatry for treatment  4.  Disability reportedly due to vision.  Follow  5.  Bipolar Disorder:  Referral to [facility].  6.  Erectile Dysfunction:  Viagra.

## 2015-11-17 ENCOUNTER — Telehealth: Payer: Self-pay

## 2015-11-17 NOTE — Telephone Encounter (Signed)
Patient's Viagra prescription arrived at the office today through [employer] patient assistance. Patient has been informed and states he will be here before 5pm 11/17/2015 to pick up the prescription.

## 2016-03-18 ENCOUNTER — Telehealth: Payer: Self-pay | Admitting: Internal Medicine

## 2016-03-18 NOTE — Telephone Encounter (Signed)
To Dr. Mulberry for approval 

## 2016-03-18 NOTE — Telephone Encounter (Signed)
Patient needs refill for sildenafil (VIAGRA) 50 mg tab.  Please call into [facility], contact number [phone].  Patient ID: [id number].    Patient can be reached at [phone].  Cell: [phone].

## 2016-03-19 NOTE — Telephone Encounter (Signed)
Okay to call into [facility]--I think they need a prescription every 6 months.

## 2016-03-19 NOTE — Telephone Encounter (Signed)
Rx called into [employer]. Order # [id number]. Left message fr patient informing him that Rx was called in

## 2016-03-25 ENCOUNTER — Telehealth: Payer: Self-pay

## 2016-03-25 NOTE — Telephone Encounter (Signed)
Called patient and informed Rx for Viagra is ready to be picked up.

## 2016-05-23 ENCOUNTER — Telehealth: Payer: Self-pay

## 2016-05-23 NOTE — Telephone Encounter (Signed)
Patient notified medication ready to be picked up.

## 2016-07-26 ENCOUNTER — Telehealth: Payer: Self-pay

## 2016-07-26 NOTE — Telephone Encounter (Signed)
Patient called asking for a refill to be called into [facility] for Viagra. [facility] [employer] @ [phone]. Rx refilled. Order # [id number].

## 2016-09-15 ENCOUNTER — Other Ambulatory Visit: Payer: Self-pay | Admitting: Internal Medicine

## 2016-09-15 DIAGNOSIS — K219 Gastro-esophageal reflux disease without esophagitis: Secondary | ICD-10-CM

## 2016-09-23 ENCOUNTER — Telehealth: Payer: Self-pay

## 2016-09-23 NOTE — Telephone Encounter (Signed)
Spoke with [name]. Informed that patient has medicaid and no longer lives in the [place] area. Patient will need to find a new physician. Informed Dr. Amil Amen will cover his medication for the next 90 days. [name] verbalized understanding. Letter mailed out to patient.

## 2016-09-24 NOTE — Telephone Encounter (Signed)
Ethan [name] will process letter to go out in next days mail.  September 25, 2016.

## 2016-09-30 ENCOUNTER — Telehealth: Payer: Self-pay | Admitting: Internal Medicine

## 2016-09-30 NOTE — Telephone Encounter (Signed)
Patient would like to know if Rx for VIAGRA 50 mg tablet has been processed with [employer].    Please notify patient representative Ms. [name].

## 2016-10-02 NOTE — Telephone Encounter (Signed)
Spoke with Ethan [name]. Informed we will no longer fill this Rx. Ethan [name] verbalized understanding.

## 2016-10-30 ENCOUNTER — Other Ambulatory Visit: Payer: Self-pay | Admitting: Internal Medicine

## 2016-10-30 DIAGNOSIS — K219 Gastro-esophageal reflux disease without esophagitis: Secondary | ICD-10-CM

## 2016-11-04 NOTE — Telephone Encounter (Signed)
Patient needs refill of NEXIUM 40 mg capsule

## 2016-11-26 ENCOUNTER — Emergency Department (HOSPITAL_COMMUNITY)
Admission: EM | Admit: 2016-11-26 | Discharge: 2016-11-27 | Disposition: A | Discharge status: Other Acute Inpatient Hospital | Payer: Medicaid Other | Attending: Emergency Medicine | Admitting: Emergency Medicine

## 2016-11-26 DIAGNOSIS — F17210 Nicotine dependence, cigarettes, uncomplicated: Secondary | ICD-10-CM | POA: Insufficient documentation

## 2016-11-26 DIAGNOSIS — F333 Major depressive disorder, recurrent, severe with psychotic symptoms: Secondary | ICD-10-CM | POA: Insufficient documentation

## 2016-11-26 DIAGNOSIS — F1920 Other psychoactive substance dependence, uncomplicated: Secondary | ICD-10-CM | POA: Insufficient documentation

## 2016-11-26 DIAGNOSIS — F10239 Alcohol dependence with withdrawal, unspecified: Secondary | ICD-10-CM | POA: Insufficient documentation

## 2016-11-26 DIAGNOSIS — I10 Essential (primary) hypertension: Secondary | ICD-10-CM | POA: Insufficient documentation

## 2016-11-26 DIAGNOSIS — Z79899 Other long term (current) drug therapy: Secondary | ICD-10-CM | POA: Insufficient documentation

## 2016-11-26 LAB — ETHANOL: Alcohol, Ethyl (B): <5 mg/dL (ref <5)

## 2016-11-26 LAB — RAPID URINE DRUG SCREEN, HOSP PERFORMED
AMPHETAMINES: NOT DETECTED
BARBITURATES: NOT DETECTED
BENZODIAZEPINES: NOT DETECTED
COCAINE: POSITIVE — AB
Opiates: NOT DETECTED
Tetrahydrocannabinol: POSITIVE — AB

## 2016-11-26 LAB — COMPREHENSIVE METABOLIC PANEL
ALBUMIN: 4.3 g/dL (ref 3.5–5.0)
ALT: 27 U/L (ref 17–63)
ANION GAP: 10 (ref 5–15)
AST: 48 U/L — AB (ref 15–41)
Alkaline Phosphatase: 72 U/L (ref 38–126)
BILIRUBIN TOTAL: 0.4 mg/dL (ref 0.3–1.2)
BUN: 16 mg/dL (ref 6–20)
CHLORIDE: 107 mmol/L (ref 101–111)
CO2: 22 mmol/L (ref 22–32)
Calcium: 9 mg/dL (ref 8.9–10.3)
Creatinine, Ser: 1.13 mg/dL (ref 0.61–1.24)
GFR calc Af Amer: >60 mL/min (ref >60)
GFR calc non Af Amer: >60 mL/min (ref >60)
GLUCOSE: 150 mg/dL — AB (ref 65–99)
Potassium: 3.5 mmol/L (ref 3.5–5.1)
SODIUM: 139 mmol/L (ref 135–145)
TOTAL PROTEIN: 7.5 g/dL (ref 6.5–8.1)

## 2016-11-26 LAB — CBC WITH DIFFERENTIAL/PLATELET
BASOS ABS: 0 10*3/uL (ref 0.0–0.1)
Basophils Relative: 0 %
EOS ABS: 0.2 10*3/uL (ref 0.0–0.7)
EOS PCT: 2 %
HCT: 41.7 % (ref 39.0–52.0)
Hemoglobin: 14.5 g/dL (ref 13.0–17.0)
LYMPHS ABS: 2.3 10*3/uL (ref 0.7–4.0)
LYMPHS PCT: 28 %
MCH: 28.3 pg (ref 26.0–34.0)
MCHC: 34.8 g/dL (ref 30.0–36.0)
MCV: 81.4 fL (ref 78.0–100.0)
MONO ABS: 0.8 10*3/uL (ref 0.1–1.0)
Monocytes Relative: 10 %
Neutro Abs: 5.1 10*3/uL (ref 1.7–7.7)
Neutrophils Relative %: 60 %
PLATELETS: 205 10*3/uL (ref 150–400)
RBC: 5.12 MIL/uL (ref 4.22–5.81)
RDW: 13.2 % (ref 11.5–15.5)
WBC: 8.3 10*3/uL (ref 4.0–10.5)

## 2016-11-26 MED ORDER — ONDANSETRON HCL 4 MG PO TABS: 4.0000 mg | ORAL_TABLET | Freq: Three times a day (TID) | ORAL | Status: DC | PRN

## 2016-11-26 MED ORDER — LORAZEPAM 2 MG/ML IJ SOLN: 0.0000 mg | Freq: Four times a day (QID) | INTRAMUSCULAR | Status: DC

## 2016-11-26 MED ORDER — DARIFENACIN HYDROBROMIDE ER 7.5 MG PO TB24
7.5000 mg | ORAL_TABLET | Freq: Every day | ORAL | Status: DC
Start: 2016-11-26 — End: 2016-11-27
  Administered 2016-11-26: 7.5 mg via ORAL
  Filled 2016-11-26: qty 1

## 2016-11-26 MED ORDER — THIAMINE HCL 100 MG/ML IJ SOLN
100.0000 mg | Freq: Every day | INTRAMUSCULAR | Status: DC
Start: 2016-11-26 — End: 2016-11-27

## 2016-11-26 MED ORDER — ACETAMINOPHEN 325 MG PO TABS: 650.0000 mg | ORAL_TABLET | ORAL | Status: DC | PRN

## 2016-11-26 MED ORDER — LORAZEPAM 1 MG PO TABS: 0.0000 mg | ORAL_TABLET | Freq: Two times a day (BID) | ORAL | Status: DC

## 2016-11-26 MED ORDER — VITAMIN B-1 100 MG PO TABS
100.0000 mg | ORAL_TABLET | Freq: Every day | ORAL | Status: DC
  Administered 2016-11-26: 100 mg via ORAL
  Filled 2016-11-26: qty 1

## 2016-11-26 MED ORDER — ALUM & MAG HYDROXIDE-SIMETH 200-200-20 MG/5ML PO SUSP: 30.0000 mL | Freq: Four times a day (QID) | ORAL | Status: DC | PRN

## 2016-11-26 MED ORDER — LORAZEPAM 2 MG/ML IJ SOLN: 0.0000 mg | Freq: Two times a day (BID) | INTRAMUSCULAR | Status: DC

## 2016-11-26 MED ORDER — LORAZEPAM 1 MG PO TABS
0.0000 mg | ORAL_TABLET | Freq: Four times a day (QID) | ORAL | Status: DC
Start: 2016-11-26 — End: 2016-11-27

## 2016-11-26 MED ORDER — PANTOPRAZOLE SODIUM 40 MG PO TBEC
40.0000 mg | DELAYED_RELEASE_TABLET | Freq: Every day | ORAL | Status: DC
  Administered 2016-11-26: 40 mg via ORAL
  Filled 2016-11-26: qty 1

## 2016-11-26 NOTE — BH Assessment (Signed)
Patriciaann Clan NP recommends inpatient psychiatric treatment. TTS to look for placement

## 2016-11-26 NOTE — ED Triage Notes (Signed)
Pt is here to seek detox from etoh, thc, and crack Pt last consumed these yesterday. Pt normally drinks half a case of beer per day. Pt states he normally smokes 3-4 blunts of thc/day normally. Pt states he normally uses crack 3 times per week. Pt denies pain at this time and is not diaphoretic nor tremulous.

## 2016-11-26 NOTE — ED Notes (Signed)
Bed: [facility]PT1 Expected date:  Expected time:  Means of arrival:  Comments: 

## 2016-11-26 NOTE — ED Provider Notes (Signed)
[facility] DEPT Provider Note   CSN: [id number] Arrival date & time: 11/26/16  1747     History   Chief Complaint Chief Complaint  Patient presents with  . detox    HPI Ethan [name] is a 51 y.o. male.  HPI   Presents with desire for wanting detox.  Reports daily etoh and THC use, reports depenedence. Feels anxiety now, thinks it is from etoh withdrawal.  Reports paranoid hallucinations, blaming fiance for things, which has been worsening over the last 2 years that he has been using. It is getting to a point that he is becoming angry, getting violent with his fiance.  Had not had anything to drink or smoke for 60yr prior to that. Reports "I don't like myself" and "I want to like myself again."  Reports severe depression, reports hx of depression, bipolar, schizophrenia.  Is supposed to be on medications but feels they make him violent so hasn't been taking them. Reports hx of violent tendencies "so I have to be careful."  Would like inpatient treatment. Tried outpatient but it didn't work.  Reports no SI at this moment but "I go through that a lot."  Took an overdose 18 years ago in suicide attempt. Reports auditory hallucinations and racing thoughts. "I don't know how she puts up with it."   Past Medical History:  Diagnosis Date  . Alcoholism ([place])   . Anxiety   . Depression   . GERD (gastroesophageal reflux disease)   . Heart murmur   . Hepatitis C    Treated in prison for 18 months.  Sounds like Ribaviron and Interferon  2009-2011  . Hypertension   . Peptic ulcer   . Substance abuse   . Urge urinary incontinence    07/20/2015:  evaluated by Dr. Kathie Rhodes, Urodynamics planned    Patient Active Problem List   Diagnosis Date Noted  . Scrotal abscess 12/23/2014  . HTN (hypertension) 12/22/2014  . GERD (gastroesophageal reflux disease) 12/22/2014  . Tobacco use disorder 12/22/2014  . Stimulant use disorder (COCAINE) 12/22/2014  . Cannabis use disorder, severe,  dependence (Ethan [name]) 12/22/2014  . Alcohol use disorder, mild, abuse 12/22/2014  . Major depressive disorder, recurrent episode, moderate with anxious distress ([place]) 12/22/2014  . Antisocial personality disorder 12/22/2014  . Hepatitis C (treated) 09/01/2013    Past Surgical History:  Procedure Laterality Date  . APPENDECTOMY    . INGUINAL HERNIA REPAIR Bilateral        Home Medications    Prior to Admission medications   Medication Sig Start Date End Date Taking? Authorizing Provider  esomeprazole (NEXIUM) 40 MG capsule TAKE 1 CAPSULE BY MOUTH ON AN EMPTY STOMACH ONE HOUR BEFORE BREAKFAST OR AT BEDTIME AT LEAST 2 HOURS AFTER EATING 11/04/16  Yes Mack Hook, MD  solifenacin (VESICARE) 5 MG tablet Take 5 mg by mouth daily.   Yes [provider]  sildenafil (VIAGRA) 50 MG tablet 1 tab by mouth once every 24 hours as needed for activity 11/16/15   Mack Hook, MD    Family History Family History  Problem Relation Age of Onset  . Breast cancer Sister   . Breast cancer Maternal Grandmother   . Colon polyps Brother   . Diabetes Sister        x 2  . Diabetes Maternal Aunt   . Diabetes Paternal Aunt   . Diabetes Mother        DM complications cause of death  . Alcohol abuse Father   .  COPD Neg Hx     Social History Social History  Substance Use Topics  . Smoking status: Current Every Day Smoker    Packs/day: 0.50    Years: 12.00    Types: Cigarettes    Start date: 07/14/1976  . Smokeless tobacco: Never Used     Comment: Stopped for 16 years.  Restarted with bout of depression/anxiety recently  . Alcohol use 0.0 oz/week     Comment: 3 beers nightly     Allergies   Patient has no known allergies.   Review of Systems Review of Systems  Constitutional: Negative for fever.  HENT: Negative for sore throat.   Eyes: Negative for visual disturbance.  Respiratory: Negative for shortness of breath.   Cardiovascular: Negative for chest pain.    Gastrointestinal: Negative for abdominal pain, nausea and vomiting.  Genitourinary: Positive for frequency (saw urologist for that). Negative for difficulty urinating.  Musculoskeletal: Negative for back pain and neck stiffness.  Skin: Negative for rash.  Neurological: Negative for syncope and headaches.     Physical Exam Updated Vital Signs BP 120/89 (BP Location: Right Arm)   Pulse 96   Temp 98.2 F (36.8 C) (Oral)   Resp 17   SpO2 99%   Physical Exam  Constitutional: He is oriented to person, place, and time. He appears well-developed and well-nourished. No distress.  HENT:  Head: Normocephalic and atraumatic.  Eyes: Conjunctivae and EOM are normal.  Neck: Normal range of motion.  Cardiovascular: Normal rate, regular rhythm, normal heart sounds and intact distal pulses.  Exam reveals no gallop and no friction rub.   No murmur heard. Pulmonary/Chest: Effort normal and breath sounds normal. No respiratory distress. He has no wheezes. He has no rales.  Abdominal: Soft. He exhibits no distension. There is no tenderness. There is no guarding.  Musculoskeletal: He exhibits no edema.  Neurological: He is alert and oriented to person, place, and time.  Skin: Skin is warm and dry. He is not diaphoretic.  Psychiatric: Suicidal: reports recently but not at thismoment. He expresses no suicidal plans and no homicidal plans.  Nursing note and vitals reviewed.    ED Treatments / Results  Labs (all labs ordered are listed, but only abnormal results are displayed) Labs Reviewed  COMPREHENSIVE METABOLIC PANEL - Abnormal; Notable for the following:       Result Value   Glucose, Bld 150 (*)    AST 48 (*)    All other components within normal limits  RAPID URINE DRUG SCREEN, HOSP PERFORMED - Abnormal; Notable for the following:    Cocaine POSITIVE (*)    Tetrahydrocannabinol POSITIVE (*)    All other components within normal limits  ETHANOL  CBC WITH DIFFERENTIAL/PLATELET    EKG   EKG Interpretation  Date/Time:  Tuesday November 26 2016 21:40:33 EDT Ventricular Rate:  80 PR Interval:    QRS Duration: 95 QT Interval:  400 QTC Calculation: 462 R Axis:   39 Text Interpretation:  Sinus rhythm Borderline prolonged PR interval Abnormal R-wave progression, early transition No significant change since last tracing Confirmed by Gareth Morgan [id number]) on 11/26/2016 10:32:34 PM       Radiology No results found.  Procedures Procedures (including critical care time)  Medications Ordered in ED Medications - No data to display   Initial Impression / Assessment and Plan / ED Course  I have reviewed the triage vital signs and the nursing notes.  Pertinent labs & imaging results that were available during my care  of the patient were reviewed by me and considered in my medical decision making (see chart for details).     51yo male with history of hypertension, depression, bipolar, schizophrenia, polysubstance abuse, presents with concern for wanting detox, with SI, violent outbursts, auditory hallucinations. Patient medically cleared. No signs of significant withdrawal.  Consulted TTS.  Patient meets inpatient criteria. Transferred to [facility] for [facility] care.  Final Clinical Impressions(s) / ED Diagnoses   Final diagnoses:  Polysubstance dependence ([place])  Severe episode of recurrent major depressive disorder, with psychotic features [facility])    New Prescriptions Discharge Medication List as of 11/27/2016  2:24 AM       Gareth Morgan, MD 11/27/16 [phone]
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
## 2016-11-27 ENCOUNTER — Inpatient Hospital Stay (HOSPITAL_COMMUNITY)
Admission: EM | Admit: 2016-11-27 | Discharge: 2016-12-02 | DRG: 885 | Disposition: A | Discharge status: Home / Self Care | Payer: Medicaid Other | Source: Intra-hospital | Attending: Psychiatry | Admitting: Psychiatry

## 2016-11-27 ENCOUNTER — Encounter (HOSPITAL_COMMUNITY): Payer: Self-pay | Admitting: Emergency Medicine

## 2016-11-27 ENCOUNTER — Encounter (HOSPITAL_COMMUNITY): Payer: Self-pay

## 2016-11-27 DIAGNOSIS — K219 Gastro-esophageal reflux disease without esophagitis: Secondary | ICD-10-CM | POA: Diagnosis present

## 2016-11-27 DIAGNOSIS — R5381 Other malaise: Secondary | ICD-10-CM

## 2016-11-27 DIAGNOSIS — R443 Hallucinations, unspecified: Secondary | ICD-10-CM

## 2016-11-27 DIAGNOSIS — B1920 Unspecified viral hepatitis C without hepatic coma: Secondary | ICD-10-CM | POA: Diagnosis present

## 2016-11-27 DIAGNOSIS — F209 Schizophrenia, unspecified: Secondary | ICD-10-CM | POA: Diagnosis present

## 2016-11-27 DIAGNOSIS — F419 Anxiety disorder, unspecified: Secondary | ICD-10-CM | POA: Diagnosis present

## 2016-11-27 DIAGNOSIS — F17210 Nicotine dependence, cigarettes, uncomplicated: Secondary | ICD-10-CM

## 2016-11-27 DIAGNOSIS — I10 Essential (primary) hypertension: Secondary | ICD-10-CM | POA: Diagnosis present

## 2016-11-27 DIAGNOSIS — F332 Major depressive disorder, recurrent severe without psychotic features: Principal | ICD-10-CM | POA: Diagnosis present

## 2016-11-27 DIAGNOSIS — Z818 Family history of other mental and behavioral disorders: Secondary | ICD-10-CM

## 2016-11-27 DIAGNOSIS — R5383 Other fatigue: Secondary | ICD-10-CM | POA: Diagnosis not present

## 2016-11-27 DIAGNOSIS — G4700 Insomnia, unspecified: Secondary | ICD-10-CM | POA: Diagnosis present

## 2016-11-27 DIAGNOSIS — F1420 Cocaine dependence, uncomplicated: Secondary | ICD-10-CM

## 2016-11-27 DIAGNOSIS — Z811 Family history of alcohol abuse and dependence: Secondary | ICD-10-CM | POA: Diagnosis not present

## 2016-11-27 DIAGNOSIS — F1994 Other psychoactive substance use, unspecified with psychoactive substance-induced mood disorder: Secondary | ICD-10-CM | POA: Diagnosis present

## 2016-11-27 MED ORDER — THIAMINE HCL 100 MG/ML IJ SOLN: 100.0000 mg | Freq: Once | INTRAMUSCULAR | Status: DC

## 2016-11-27 MED ORDER — RISPERIDONE 0.5 MG PO TABS
0.5000 mg | ORAL_TABLET | Freq: Two times a day (BID) | ORAL | Status: DC
  Administered 2016-11-27 – 2016-12-02 (×10): 0.5 mg via ORAL
  Filled 2016-11-27 (×16): qty 1

## 2016-11-27 MED ORDER — NICOTINE POLACRILEX 2 MG MT GUM: 2.0000 mg | CHEWING_GUM | OROMUCOSAL | Status: DC | PRN

## 2016-11-27 MED ORDER — CHLORDIAZEPOXIDE HCL 25 MG PO CAPS
25.0000 mg | ORAL_CAPSULE | Freq: Three times a day (TID) | ORAL | Status: DC | PRN
  Administered 2016-11-27 – 2016-11-30 (×4): 25 mg via ORAL
  Filled 2016-11-27 (×4): qty 1

## 2016-11-27 MED ORDER — ONDANSETRON 4 MG PO TBDP: 4.0000 mg | ORAL_TABLET | Freq: Four times a day (QID) | ORAL | Status: AC | PRN

## 2016-11-27 MED ORDER — MAGNESIUM HYDROXIDE 400 MG/5ML PO SUSP: 30.0000 mL | Freq: Every day | ORAL | Status: DC | PRN

## 2016-11-27 MED ORDER — ADULT MULTIVITAMIN W/MINERALS CH
1.0000 | ORAL_TABLET | Freq: Every day | ORAL | Status: DC
  Administered 2016-11-27 – 2016-12-02 (×6): 1 via ORAL
  Filled 2016-11-27 (×9): qty 1

## 2016-11-27 MED ORDER — HYDROXYZINE HCL 25 MG PO TABS
25.0000 mg | ORAL_TABLET | Freq: Four times a day (QID) | ORAL | Status: AC | PRN
  Administered 2016-11-28 – 2016-11-29 (×2): 25 mg via ORAL
  Filled 2016-11-27 (×2): qty 1

## 2016-11-27 MED ORDER — LORAZEPAM 1 MG PO TABS: 1.0000 mg | ORAL_TABLET | Freq: Two times a day (BID) | ORAL | Status: DC

## 2016-11-27 MED ORDER — LOPERAMIDE HCL 2 MG PO CAPS: 2.0000 mg | ORAL_CAPSULE | ORAL | Status: AC | PRN

## 2016-11-27 MED ORDER — LORAZEPAM 1 MG PO TABS: 1.0000 mg | ORAL_TABLET | Freq: Three times a day (TID) | ORAL | Status: DC

## 2016-11-27 MED ORDER — VITAMIN B-1 100 MG PO TABS
100.0000 mg | ORAL_TABLET | Freq: Every day | ORAL | Status: DC
  Administered 2016-11-28 – 2016-12-02 (×5): 100 mg via ORAL
  Filled 2016-11-27 (×7): qty 1

## 2016-11-27 MED ORDER — PANTOPRAZOLE SODIUM 40 MG PO TBEC
40.0000 mg | DELAYED_RELEASE_TABLET | Freq: Every day | ORAL | Status: DC
  Administered 2016-11-27 – 2016-12-02 (×6): 40 mg via ORAL
  Filled 2016-11-27 (×9): qty 1

## 2016-11-27 MED ORDER — LORAZEPAM 1 MG PO TABS
1.0000 mg | ORAL_TABLET | Freq: Four times a day (QID) | ORAL | Status: DC
  Administered 2016-11-27 (×2): 1 mg via ORAL
  Filled 2016-11-27 (×2): qty 1

## 2016-11-27 MED ORDER — NICOTINE 21 MG/24HR TD PT24
21.0000 mg | MEDICATED_PATCH | Freq: Every day | TRANSDERMAL | Status: DC
  Administered 2016-11-27 – 2016-12-02 (×6): 21 mg via TRANSDERMAL
  Filled 2016-11-27 (×7): qty 1

## 2016-11-27 MED ORDER — LORAZEPAM 1 MG PO TABS: 1.0000 mg | ORAL_TABLET | Freq: Every day | ORAL | Status: DC

## 2016-11-27 MED ORDER — LORAZEPAM 1 MG PO TABS
1.0000 mg | ORAL_TABLET | Freq: Four times a day (QID) | ORAL | Status: DC | PRN
  Administered 2016-11-27: 1 mg via ORAL
  Filled 2016-11-27: qty 1

## 2016-11-27 MED ORDER — ALUM & MAG HYDROXIDE-SIMETH 200-200-20 MG/5ML PO SUSP: 30.0000 mL | ORAL | Status: DC | PRN

## 2016-11-27 MED ORDER — ACETAMINOPHEN 325 MG PO TABS
650.0000 mg | ORAL_TABLET | Freq: Four times a day (QID) | ORAL | Status: DC | PRN
  Administered 2016-11-28: 650 mg via ORAL
  Filled 2016-11-27: qty 2

## 2016-11-27 MED ORDER — TRAZODONE HCL 50 MG PO TABS
50.0000 mg | ORAL_TABLET | Freq: Every evening | ORAL | Status: DC | PRN
  Administered 2016-11-29 – 2016-12-01 (×4): 50 mg via ORAL
  Filled 2016-11-27 (×16): qty 1

## 2016-11-27 NOTE — Progress Notes (Signed)
D: Patient observed in room much of the day however has been more visible late afternoon and at present. Frequent contacts made 1:1 throughout shift. Patient verbalizes to this Probation officer he is "anxious and restless inside." Recognizes he needs risperdal to help manage his symptoms. Patient's affect anxious, blunted with congruent mood. Per self inventory and discussions with writer, rates depression at a 7/10, hopelessness at a 6/10 and anxiety at a 9/10. Rates sleep as fair, appetite as fair, energy as low and concentration as good.  States goal for today is to "feel normal again" and "not use." Denies pain however withdrawal symptoms noted and reported - sweats, nervousness, restlessness, anxiety and tremors.   A: Medicated per orders, no prns requested or required. Level III obs in place for safety. Emotional support offered and self inventory reviewed. Encouraged completion of Suicide Safety Plan and programming participation. Discussed POC with MD, SW..   R: Patient verbalizes understanding of POC. Patient denies SI/HI/AVH and remains safe on level III obs. Will continue to monitor closely and make verbal contact frequently.

## 2016-11-27 NOTE — [facility] Suicide Risk Assessment (Signed)
[facility] Admission Suicide Risk Assessment   Nursing information obtained from:  Patient Demographic factors:  Male, Low socioeconomic status, Unemployed Current Mental Status:  Suicidal ideation indicated by patient Loss Factors:  Financial problems / change in socioeconomic status Historical Factors:  Impulsivity Risk Reduction Factors:  Religious beliefs about death, Positive social support  Total Time spent with patient: 45 minutes Principal Problem: Cocaine use disorder, severe, dependence ([place]) Diagnosis:   Patient Active Problem List   Diagnosis Date Noted  . Substance induced mood disorder ([place]) [F19.94] 11/27/2016  . Cocaine use disorder, severe, dependence ([place]) [F14.20] 11/27/2016  . Scrotal abscess [N49.2] 12/23/2014  . HTN (hypertension) [I10] 12/22/2014  . GERD (gastroesophageal reflux disease) [K21.9] 12/22/2014  . Tobacco use disorder [F17.200] 12/22/2014  . 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 Cannabis use disorder, severe, dependence ([place]) [F12.20] 12/22/2014  . Alcohol use disorder, mild, abuse [F10.10] 12/22/2014  . Major depressive disorder, recurrent episode, moderate with anxious distress ([place]) [F33.1] 12/22/2014  . Antisocial personality disorder [F60.2] 12/22/2014  . Hepatitis C (treated) [B19.20] 09/01/2013   Subjective Data:  51 yo AAM, single, on SSI,  lives with his payee. Says he is a registered sex offender. Background history of SUD. Self presented to seek help. UDS was positive for cocaine and THC. Patient reports being tired of his lifestyle. He is using cocaine, THC and alcohol daily. Says he gets paranoid as he thinks his payee is trying to poison him. He has transient hallucinations in visual and auditory modality. Says he gets destructive and agitated under the influence. He is not allowed back to where he used to live. He hopes to get into treatment or get into a sober house. Has burnt bridges to his family. Has limited support. No  access to weapons.  We discussed use of risperidone to target psychosis. Patient consented to treatment after we reviewed the risks and benefits.   Continued Clinical Symptoms:  Alcohol Use Disorder Identification Test Final Score (AUDIT): 39 The "Alcohol Use Disorders Identification Test", Guidelines for Use in Primary Care, Second Edition.  World Pharmacologist [facility]). Score between 0-7:  no or low risk or alcohol related problems. Score between 8-15:  moderate risk of alcohol related problems. Score between 16-19:  high risk of alcohol related problems. Score 20 or above:  warrants further diagnostic evaluation for alcohol dependence and treatment.   CLINICAL FACTORS:   Alcohol/Substance Abuse/Dependencies   Musculoskeletal: Strength & Muscle Tone: within normal limits Gait & Station: normal Patient leans: N/A  Psychiatric Specialty Exam: Physical Exam  Constitutional: No distress.  Neck: Normal range of motion.  Respiratory: Effort normal.  Skin: Skin is dry. He is not diaphoretic.  Psychiatric:  As above    ROS  Blood pressure (!) 140/91, pulse 75, temperature 98.1 F (36.7 C), temperature source Oral, resp. rate 16, height 5\' 9"  (1.753 m), weight 77.1 kg (170 lb), SpO2 99 %.Body mass index is 25.1 kg/m.  General Appearance: In hospital clothing. A bit drowsy when he attempted to get up. Feels sedated with scheduled Ativan. Not sweaty, not shaky.   Eye Contact:  Good  Speech:  Clear and Coherent and Normal Rate  Volume:  Normal  Mood:  Overwhelmed   Affect:  Appropriate and Restricted  Thought Process:  Linear  Orientation:  Full (Time, Place, and Person)  Thought Content:  Rumination  Suicidal Thoughts:  No  Homicidal Thoughts:  No  Memory:  Immediate;   Fair Recent;   Fair Remote;  Fair  Judgement:  Fair  Insight:  Good  Psychomotor Activity:  Decreased  Concentration:  Concentration: Fair and Attention Span: Fair  Recall:  [employer] of Knowledge:   Fair  Language:  Good  Akathisia:  NA  Handed:    AIMS (if indicated):     Assets:  Communication Skills Desire for Improvement Physical Health Resilience  ADL's:  Intact  Cognition:  Impaired,  Mild  Sleep:  Number of Hours: 1 (late admission)      COGNITIVE FEATURES THAT CONTRIBUTE TO RISK:  None    SUICIDE RISK:   Mild:  Suicidal ideation of limited frequency, intensity, duration, and specificity.  There are no identifiable plans, no associated intent, mild dysphoria and related symptoms, good self-control (both objective and subjective assessment), few other risk factors, and identifiable protective factors, including available and accessible social support.  PLAN OF CARE: 1. Suicide precautions 2. Alcohol withdrawal protocol. Will switch to Librium PRN as suggested by CIWA score 3. Risperidone 0.5 mg BID to target psychosis   I certify that inpatient services furnished can reasonably be expected to improve the patient's condition.   [place], MD 11/27/2016, 4:51 PM
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
## 2016-11-27 NOTE — ED Notes (Signed)
Pt transported to [facility] by [facility] transportation service for continuation of specialized care. Belongings given to driver after patient signed for them. Pt left in no acute distress. 

## 2016-11-27 NOTE — Social Work (Signed)
Referred to [facility], is [place] resident.  Azriella Mattia, LCSW Lead Clinical Social Worker Phone:  [phone]  

## 2016-11-27 NOTE — Plan of Care (Signed)
Problem: Education: Goal: Verbalization of understanding the information provided will improve Outcome: Progressing Patient verbalizes understanding of information, education provided.  Problem: Safety: Goal: Ability to remain free from injury will improve Outcome: Completed/Met Date Met: 11/27/16 Patient denies SI, thoughts to self harm.

## 2016-11-27 NOTE — ED Notes (Signed)
Patient currently denies SI,HI and AVH at this time. But admits that he was suicidal and homicidal earlier. Plan of care discussed with patient. Encouragement and support provided and safety maintain. Q 15 min safety checks remain in place and video monitoring.
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
## 2016-11-27 NOTE — Plan of Care (Signed)
Problem: Safety: Goal: Periods of time without injury will increase Outcome: Progressing Patient on q15 minute safety checks and low fall risk precautions. Patient contracts for safety with staff.

## 2016-11-27 NOTE — [facility] Group Notes (Signed)
[place] LCSW Group Therapy  11/27/2016 4:04 PM  Type of Therapy:  Group Therapy  Participation Level:  Did Not Attend-pt invited. Chose to remain in bed.   Summary of Progress/Problems: [facility] Speaker came to talk about his personal journey with substance abuse and addiction. The pt processed ways by which to relate to the speaker. [place] speaker provided handouts and educational information pertaining to groups and services offered by the [facility].   Mohid Furuya N Smart LCSW 11/27/2016, 4:04 PM
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
## 2016-11-27 NOTE — Progress Notes (Signed)
Patient attended NA group meeting. 

## 2016-11-27 NOTE — ED Notes (Signed)
Bed: [facility] Expected date:  Expected time:  Means of arrival:  Comments: 7

## 2016-11-27 NOTE — Tx Team (Signed)
Initial Treatment Plan 11/27/2016 4:37 AM Waldemar Dickens TSV:[id number]    PATIENT STRESSORS: Financial difficulties Substance abuse   PATIENT STRENGTHS: Active sense of humor Capable of independent living Communication skills Motivation for treatment/growth   PATIENT IDENTIFIED PROBLEMS: Substance Abuse  Suicide Risk  "Get long term treatment"  "Feel normal"  "Be happy"             DISCHARGE CRITERIA:  Ability to meet basic life and health needs Adequate post-discharge living arrangements Improved stabilization in mood, thinking, and/or behavior Medical problems require only outpatient monitoring Motivation to continue treatment in a less acute level of care Need for constant or close observation no longer present Reduction of life-threatening or endangering symptoms to within safe limits Safe-care adequate arrangements made Verbal commitment to aftercare and medication compliance Withdrawal symptoms are absent or subacute and managed without 24-hour nursing intervention  PRELIMINARY DISCHARGE PLAN: Outpatient therapy  PATIENT/FAMILY INVOLVEMENT: This treatment plan has been presented to and reviewed with the patient, AHNAF CAPONI.  The patient and family have been given the opportunity to ask questions and make suggestions.  Annia Friendly, RN 11/27/2016, 4:37 AM

## 2016-11-28 NOTE — Progress Notes (Signed)
D: Pt visible in milieu majority of this shift. A & O X4. Presents with depressed affect and mood.  Denies SI, HI, AVH and pain when assessed earlier this shift. Rates his depression 9/10, hopelessness 8/10 and anxiety 8/10. Reports fair sleep related to nightmares "I really have not been sleeping properly". "I keep having some dreams that seems like I'm hallucinating, but when I wake up, they're just dreams". Reports irritability and cravings when assessed.  A: Scheduled and PRN (see emar) medications administered as ordered, effects monitored. Support and encouragement provided to pt throughout this shift. Q 15 minutes safety checks maintained without issues.  R: Pt receptive to care. Compliant with medications when offered. Denies adverse drug reactions at this time. Tolerates all PO intake well. POC maintained for safety and mood stability.

## 2016-11-28 NOTE — Plan of Care (Signed)
Problem: Activity: Goal: Sleeping patterns will improve Outcome: Not Progressing Pt complained of poor sleep when assessed this AM, "I really have not been sleeping properly".  "I keep having some dreams that seems like I'm hallucinating, but when I wake up, they're dreams".   Problem: Health Behavior/Discharge Planning: Goal: Compliance with treatment plan for underlying cause of condition will improve Outcome: Progressing Pt has been compliant with medications as ordered. Attends scheduled groups without issues.

## 2016-11-28 NOTE — Progress Notes (Addendum)
Acadia Montana MD Progress Note  11/28/2016 3:34 PM Ethan [name]  MRN:  [id number] Subjective:   51 yo AAM, single, on SSI,  lives with his payee. Says he is a registered sex offender. Background history of SUD. Self presented to seek help. UDS was positive for cocaine and THC. Patient reports being tired of his lifestyle. He is using cocaine, THC and alcohol daily. Says he gets paranoid as he thinks his payee is trying to poison him. He has transient hallucinations in visual and auditory modality. Says he gets destructive and agitated under the influence. He is not allowed back to where he used to live.  Chart reviewed today. Patient discussed at team today.  Staff reports that he has not been agitated on the unit. He reports dreams which are vivid. Makes him feel as if he is hallucinating. No hallucinations while alert. Attended some of the unit groups. His girlfriend is supportive and wants him to get into treatment. SW is exploring [facility] as there are the only ones that would take him.  Seen today. Was in bed sleeping. Says he feels tired most of the time and all he has been doing is sleep a lot. Reassured that this is typical while coming off cocaine. Notes that inward restlessness and anxiety is less. Suicidal thoughts are lessening. No side effects from his medications. He is still motivated to get into rehab. No hallucinations while awake. No feeling persecuted here. No thoughts of violence.    Principal Problem: Substance induced mood disorder ([place]) Diagnosis:   Patient Active Problem List   Diagnosis Date Noted  . Substance induced mood disorder ([place]) [F19.94] 11/27/2016  . Cocaine use disorder, severe, dependence ([place]) [F14.20] 11/27/2016  . Scrotal abscess [N49.2] 12/23/2014  . HTN (hypertension) [I10] 12/22/2014  . GERD (gastroesophageal reflux disease) [K21.9] 12/22/2014  . Tobacco use disorder [F17.200] 12/22/2014  . 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 Cannabis  use disorder, severe, dependence ([place]) [F12.20] 12/22/2014  . Alcohol use disorder, mild, abuse [F10.10] 12/22/2014  . Major depressive disorder, recurrent episode, moderate with anxious distress ([place]) [F33.1] 12/22/2014  . Antisocial personality disorder [F60.2] 12/22/2014  . Hepatitis C (treated) [B19.20] 09/01/2013   Total Time spent with patient: 20 minutes  Past Psychiatric History: As in H&P  Past Medical History:  Past Medical History:  Diagnosis Date  . Alcoholism ([place])   . Anxiety   . Depression   . GERD (gastroesophageal reflux disease)   . Heart murmur   . Hepatitis C    Treated in prison for 18 months.  Sounds like Ribaviron and Interferon  2009-2011  . Hypertension   . Peptic ulcer   . Substance abuse   . Urge urinary incontinence    07/20/2015:  evaluated by Dr. Kathie Rhodes, Urodynamics planned    Past Surgical History:  Procedure Laterality Date  . APPENDECTOMY    . INGUINAL HERNIA REPAIR Bilateral    Family History:  Family History  Problem Relation Age of Onset  . Breast cancer Sister   . Breast cancer Maternal Grandmother   . Colon polyps Brother   . Diabetes Sister        x 2  . Diabetes Maternal Aunt   . Diabetes Paternal Aunt   . Diabetes Mother        DM complications cause of death  . Alcohol abuse Father   . COPD Neg Hx    Family Psychiatric  History: As in H&P Social History:  History  Alcohol Use  . 0.0 oz/week    Comment: 3 beers nightly     History  Drug Use  . Types: "Crack" cocaine    Comment: Occassionally MJ currently--in past crack cocaine--more than 16 years ago.    Social History   Social History  . Marital status: Significant Other    Spouse name: N/A  . Number of children: 3  . Years of education: GED   Occupational History  . 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 [employer]   Social History Main Topics  . Smoking status: Current Every Day Smoker    Packs/day: 0.50    Years: 12.00    Types:  Cigarettes    Start date: 07/14/1976  . Smokeless tobacco: Never Used     Comment: Stopped for 16 years.  Restarted with bout of depression/anxiety recently  . Alcohol use 0.0 oz/week     Comment: 3 beers nightly  . Drug use: Yes    Types: "Crack" cocaine     Comment: Occassionally MJ currently--in past crack cocaine--more than 16 years ago.  [name] Sexual activity: Yes    Partners: Female    Birth control/ protection: None     Comment: WIFE USED MAJUANA SATURDAY   Other Topics Concern  . None   Social History Narrative   Lost job and schooling secondary to "past"   Apparently for what he was incarcerated for.   Additional Social History:      Sleep: Good  Appetite:  Good  Current Medications: Current Facility-Administered Medications  Medication Dose Route Frequency Provider Last Rate Last Dose  . acetaminophen (TYLENOL) tablet 650 mg  650 mg Oral Q6H PRN Laverle Hobby, PA-C      . alum & mag hydroxide-simeth (MAALOX/MYLANTA) 200-200-20 MG/5ML suspension 30 mL  30 mL Oral Q4H PRN Patriciaann Clan E, PA-C      . chlordiazePOXIDE (LIBRIUM) capsule 25 mg  25 mg Oral Q8H PRN Artist Beach, MD   25 mg at 11/27/16 2034  . hydrOXYzine (ATARAX/VISTARIL) tablet 25 mg  25 mg Oral Q6H PRN Patriciaann Clan E, PA-C      . loperamide (IMODIUM) capsule 2-4 mg  2-4 mg Oral PRN Patriciaann Clan E, PA-C      . magnesium hydroxide (MILK OF MAGNESIA) suspension 30 mL  30 mL Oral Daily PRN Laverle Hobby, PA-C      . multivitamin with minerals tablet 1 tablet  1 tablet Oral Daily Laverle Hobby, PA-C   1 tablet at 11/28/16 0831  . nicotine (NICODERM CQ - dosed in mg/24 hours) patch 21 mg  21 mg Transdermal Daily Cobos, Myer Peer, MD   21 mg at 11/28/16 3291  . ondansetron (ZOFRAN-ODT) disintegrating tablet 4 mg  4 mg Oral Q6H PRN Laverle Hobby, PA-C      . pantoprazole (PROTONIX) EC tablet 40 mg  40 mg Oral Daily Patriciaann Clan E, PA-C   40 mg at 11/28/16 0831  . risperiDONE (RISPERDAL)  tablet 0.5 mg  0.5 mg Oral BID Abdulkadir Emmanuel, Laruth Bouchard, MD   0.5 mg at 11/28/16 0831  . thiamine (B-1) injection 100 mg  100 mg Intramuscular Once Patriciaann Clan E, PA-C      . thiamine (VITAMIN B-1) tablet 100 mg  100 mg Oral Daily Patriciaann Clan E, PA-C   100 mg at 11/28/16 0831  . traZODone (DESYREL) tablet 50 mg  50 mg Oral QHS,MR X 1 Simon, Maurine Minister, PA-C  Lab Results:  Results for orders placed or performed during the hospital encounter of 11/26/16 (from the past 48 hour(s))  Comprehensive metabolic panel     Status: Abnormal   Collection Time: 11/26/16  9:03 PM  Result Value Ref Range   Sodium 139 135 - 145 mmol/L   Potassium 3.5 3.5 - 5.1 mmol/L   Chloride 107 101 - 111 mmol/L   CO2 22 22 - 32 mmol/L   Glucose, Bld 150 (H) 65 - 99 mg/dL   BUN 16 6 - 20 mg/dL   Creatinine, Ser 1.13 0.61 - 1.24 mg/dL   Calcium 9.0 8.9 - 10.3 mg/dL   Total Protein 7.5 6.5 - 8.1 g/dL   Albumin 4.3 3.5 - 5.0 g/dL   AST 48 (H) 15 - 41 U/L   ALT 27 17 - 63 U/L   Alkaline Phosphatase 72 38 - 126 U/L   Total Bilirubin 0.4 0.3 - 1.2 mg/dL   GFR calc non Af Amer >60 >60 mL/min   GFR calc Af Amer >60 >60 mL/min    Comment: (NOTE) The eGFR has been calculated using the CKD EPI equation. This calculation has not been validated in all clinical situations. eGFR's persistently <60 mL/min signify possible Chronic Kidney Disease.    Anion gap 10 5 - 15  CBC with Diff     Status: None   Collection Time: 11/26/16  9:03 PM  Result Value Ref Range   WBC 8.3 4.0 - 10.5 K/uL   RBC 5.12 4.22 - 5.81 MIL/uL   Hemoglobin 14.5 13.0 - 17.0 g/dL   HCT 41.7 39.0 - 52.0 %   MCV 81.4 78.0 - 100.0 fL   MCH 28.3 26.0 - 34.0 pg   MCHC 34.8 30.0 - 36.0 g/dL   RDW 13.2 11.5 - 15.5 %   Platelets 205 150 - 400 K/uL   Neutrophils Relative % 60 %   Neutro Abs 5.1 1.7 - 7.7 K/uL   Lymphocytes Relative 28 %   Lymphs Abs 2.3 0.7 - 4.0 K/uL   Monocytes Relative 10 %   Monocytes Absolute 0.8 0.1 - 1.0 K/uL    Eosinophils Relative 2 %   Eosinophils Absolute 0.2 0.0 - 0.7 K/uL   Basophils Relative 0 %   Basophils Absolute 0.0 0.0 - 0.1 K/uL  Ethanol     Status: None   Collection Time: 11/26/16  9:04 PM  Result Value Ref Range   Alcohol, Ethyl (B) <5 <5 mg/dL    Comment:        LOWEST DETECTABLE LIMIT FOR SERUM ALCOHOL IS 5 mg/dL FOR MEDICAL PURPOSES ONLY   Urine rapid drug screen (hosp performed)     Status: Abnormal   Collection Time: 11/26/16  9:05 PM  Result Value Ref Range   Opiates NONE DETECTED NONE DETECTED   Cocaine POSITIVE (A) NONE DETECTED   Benzodiazepines NONE DETECTED NONE DETECTED   Amphetamines NONE DETECTED NONE DETECTED   Tetrahydrocannabinol POSITIVE (A) NONE DETECTED   Barbiturates NONE DETECTED NONE DETECTED    Comment:        DRUG SCREEN FOR MEDICAL PURPOSES ONLY.  IF CONFIRMATION IS NEEDED FOR ANY PURPOSE, NOTIFY LAB WITHIN 5 DAYS.        LOWEST DETECTABLE LIMITS FOR URINE DRUG SCREEN Drug Class       Cutoff (ng/mL) Amphetamine      1000 Barbiturate      200 Benzodiazepine   284 Tricyclics       132 Opiates  300 Cocaine          300 THC              50     Blood Alcohol level:  Lab Results  Component Value Date   ETH <5 11/26/2016   ETH <5 77/93/9030    Metabolic Disorder Labs: Lab Results  Component Value Date   HGBA1C 6.1 (H) 12/22/2014   MPG 123 11/11/2014   MPG 126 (H) 09/01/2013   No results found for: PROLACTIN Lab Results  Component Value Date   CHOL 137 12/22/2014   TRIG 38 12/22/2014   HDL 36 (L) 12/22/2014   CHOLHDL 3.8 12/22/2014   VLDL 8 12/22/2014   LDLCALC 93 12/22/2014   LDLCALC 77 11/11/2014    Physical Findings: AIMS: Facial and Oral Movements Muscles of Facial Expression: None, normal Lips and Perioral Area: None, normal Jaw: None, normal Tongue: None, normal,Extremity Movements Upper (arms, wrists, hands, fingers): None, normal Lower (legs, knees, ankles, toes): None, normal, Trunk Movements Neck,  shoulders, hips: None, normal, Overall Severity Severity of abnormal movements (highest score from questions above): None, normal Incapacitation due to abnormal movements: None, normal Patient's awareness of abnormal movements (rate only patient's report): No Awareness, Dental Status Current problems with teeth and/or dentures?: No Does patient usually wear dentures?: No  CIWA:  CIWA-Ar Total: 3 COWS:     Musculoskeletal: Strength & Muscle Tone: within normal limits Gait & Station: normal Patient leans: N/A  Psychiatric Specialty Exam: Physical Exam  Constitutional: He is oriented to person, place, and time. He appears well-developed and well-nourished.  HENT:  Head: Normocephalic and atraumatic.  Neck: Normal range of motion. Neck supple.  Respiratory: Effort normal.  Neurological: He is alert and oriented to person, place, and time.  Skin: Skin is warm and dry.  Psychiatric:  As above    ROS  Blood pressure (!) 139/96, pulse 97, temperature 98 F (36.7 C), temperature source Oral, resp. rate 18, height 5' 9"  (1.753 m), weight 77.1 kg (170 lb), SpO2 99 %.Body mass index is 25.1 kg/m.  General Appearance: In bed, good rapport, polite and pleasant. Appropriate behavior.   Eye Contact:  Good  Speech:  Clear and Coherent and Normal Rate  Volume:  Normal  Mood:  Less overwhelmed  Affect:  Appropriate and Restricted  Thought Process:  Linear  Orientation:  Full (Time, Place, and Person)  Thought Content:  Less negative rumination. No delusional theme. No hallucination in any modality.   Suicidal Thoughts:  Less intense.   Homicidal Thoughts:  No  Memory:  Immediate;   Good Recent;   Fair Remote;   Fair  Judgement:  Fair  Insight:  Good  Psychomotor Activity:  Normal  Concentration:  Concentration: Good and Attention Span: Good  Recall:  [place] of Knowledge:  Good  Language:  Good  Akathisia:  Negative  Handed:    AIMS (if indicated):     Assets:  Communication  Skills Desire for Improvement Physical Health Resilience  ADL's:  Intact  Cognition:  WNL  Sleep:  Number of Hours: 6    Assessment and Plan  Patient is smoothly detoxong from psychoactive substances. Suicidal thoughts are lessening. Psychosis is resolving gradually. He is tolerating his medications. Hopeful inpatient rehab at [facility] by early next week.  Psychiatric: SUD SIMD  Medical: HTN GERD HCV  Psychosocial:  Limited support Stigmatized   PLAN: 1. Continue current regimen 2. Continue to monitor mood, behavior and interaction with peers  3. SW facilitate aftercare.      Artist Beach, MD 11/28/2016, 3:34 PM

## 2016-11-28 NOTE — Plan of Care (Signed)
Problem: Education: Goal: Emotional status will improve Outcome: Progressing Patient denies SI/HI.

## 2016-11-28 NOTE — [facility] Suicide Risk Assessment (Signed)
[facility] INPATIENT:  Family/Significant Other Suicide Prevention Education  Suicide Prevention Education:  Education Completed; [name] (pt's girlfriend and payee) [phone] has been identified by the patient as the family member/significant other with whom the patient will be residing, and identified as the person(s) who will aid the patient in the event of a mental health crisis (suicidal ideations/suicide attempt).  With written consent from the patient, the family member/significant other has been provided the following suicide prevention education, prior to the and/or following the discharge of the patient.  The suicide prevention education provided includes the following:  Suicide risk factors  Suicide prevention and interventions  National Suicide Hotline telephone number  [facility] assessment telephone number  [facility] Emergency Assistance [place] and/or Residential Mobile Crisis Unit telephone number  Request made of family/significant other to:  Remove weapons (e.g., guns, rifles, knives), all items previously/currently identified as safety concern.    Remove drugs/medications (over-the-counter, prescriptions, illicit drugs), all items previously/currently identified as a safety concern.  The family member/significant other verbalizes understanding of the suicide prevention education information provided.  The family member/significant other agrees to remove the items of safety concern listed above.  Pt's girlfriend is supportive of patient pursuing residential treatment at [facility]. "I'm here for him and support him all the way." She states that pt was demonstrating paranoia and hallucinations prior to admission but seems to be free of these symptoms currently. SPE and aftercare plan reviewed with pt's girlfriend.   Artemisia Auvil N Smart LCSW 11/28/2016, 10:51 AM

## 2016-11-28 NOTE — Progress Notes (Signed)
Nursing Progress Note [id number]  D) Patient presents flat and depressed. Patient is isolative to his room and reports withdrawal symptoms. Patient medicated with PRNs as needed. Patient denies SI/HI/AVH or pain. Patient contracts for safety on the unit. Patient denies need for sleep medication.  A) Emotional support given. 1:1 interaction and active listening provided. Patient medicated as prescribed. Medications and plan of care reviewed with patient. Patient verbalized understanding without further questions.  Snacks and fluids provided. Opportunities for questions or concerns presented to patient. Patient encouraged to continue to work on treatment goals. Labs, vital signs and patient behavior monitored throughout shift. Patient safety maintained with q15 min safety checks. Low fall risk precautions in place and reviewed with patient; patient verbalized understanding.  R) Patient receptive to interaction with nurse. Patient remains safe on the unit at this time. Patient denies any adverse medication reactions at this time. Patient is resting in bed without complaints. Will continue to monitor.

## 2016-11-28 NOTE — Progress Notes (Signed)
Patient currently observed sleeping in bed, snoring slightly. Patient in no acute distress. Will hold sleep medications that are scheduled at this time and provide as needed.

## 2016-11-28 NOTE — [facility] Group Notes (Signed)
Adult Psychoeducational Group Note  Date:  11/28/2016 Time:  8:49 PM  Group Topic/Focus:  Wrap-Up Group:   The focus of this group is to help patients review their daily goal of treatment and discuss progress on daily workbooks.  Participation Level:  Active  Participation Quality:  Appropriate  Affect:  Appropriate  Cognitive:  Appropriate  Insight: Appropriate  Engagement in Group:  Engaged  Modes of Intervention:  Discussion  Additional Comments:  Pt. Goal for the day was to stay optimistic. Pt. Stated he had a good day.   Sherlynn Carbon 11/28/2016, 8:49 PM

## 2016-11-28 NOTE — [facility] Group Notes (Signed)
[facility] LCSW Group Therapy  11/28/2016 3:57 PM  Type of Therapy:  Group Therapy  Participation Level:  Did Not Attend -pt invited. Chose to remain in bed.   Summary of Progress/Problems:  Finding Balance in Life. Today's group focused on defining balance in one's own words, identifying things that can knock one off balance, and exploring healthy ways to maintain balance in life. Group members were asked to provide an example of a time when they felt off balance, describe how they handled that situation,and process healthier ways to regain balance in the future. Group members were asked to share the most important tool for maintaining balance that they learned while at [facility] and how they plan to apply this method after discharge.   Nicolaas Savo N Smart LCSW 11/28/2016, 3:57 PM

## 2016-11-29 NOTE — Progress Notes (Signed)
The patient attended the evening A.A.meeting and was appropriate.  

## 2016-11-29 NOTE — Tx Team (Signed)
Interdisciplinary Treatment and Diagnostic Plan Update  11/29/2016 Time of Session: 0830AM GRAE CANNATA MRN: [id number]  Principal Diagnosis: Substance induced mood disorder ([place])  Secondary Diagnoses: Principal Problem:   Substance induced mood disorder ([place]) Active Problems:   Cocaine use disorder, severe, dependence ([place])   Current Medications:  Current Facility-Administered Medications  Medication Dose Route Frequency Provider Last Rate Last Dose  . acetaminophen (TYLENOL) tablet 650 mg  650 mg Oral Q6H PRN Laverle Hobby, PA-C   650 mg at 11/28/16 1552  . alum & mag hydroxide-simeth (MAALOX/MYLANTA) 200-200-20 MG/5ML suspension 30 mL  30 mL Oral Q4H PRN Patriciaann Clan E, PA-C      . chlordiazePOXIDE (LIBRIUM) capsule 25 mg  25 mg Oral Q8H PRN Artist Beach, MD   25 mg at 11/27/16 2034  . hydrOXYzine (ATARAX/VISTARIL) tablet 25 mg  25 mg Oral Q6H PRN Laverle Hobby, PA-C   25 mg at 11/28/16 1552  . loperamide (IMODIUM) capsule 2-4 mg  2-4 mg Oral PRN Patriciaann Clan E, PA-C      . magnesium hydroxide (MILK OF MAGNESIA) suspension 30 mL  30 mL Oral Daily PRN Laverle Hobby, PA-C      . multivitamin with minerals tablet 1 tablet  1 tablet Oral Daily Laverle Hobby, PA-C   1 tablet at 11/29/16 2683  . nicotine (NICODERM CQ - dosed in mg/24 hours) patch 21 mg  21 mg Transdermal Daily Cobos, Myer Peer, MD   21 mg at 11/29/16 0823  . ondansetron (ZOFRAN-ODT) disintegrating tablet 4 mg  4 mg Oral Q6H PRN Laverle Hobby, PA-C      . pantoprazole (PROTONIX) EC tablet 40 mg  40 mg Oral Daily Laverle Hobby, PA-C   40 mg at 11/29/16 4196  . risperiDONE (RISPERDAL) tablet 0.5 mg  0.5 mg Oral BID Artist Beach, MD   0.5 mg at 11/29/16 2229  . thiamine (B-1) injection 100 mg  100 mg Intramuscular Once Patriciaann Clan E, PA-C      . thiamine (VITAMIN B-1) tablet 100 mg  100 mg Oral Daily Patriciaann Clan E, PA-C   100 mg at 11/29/16 7989  . traZODone (DESYREL) tablet 50 mg   50 mg Oral QHS,MR X 1 Simon, Spencer E, PA-C       PTA Medications: Prescriptions Prior to Admission  Medication Sig Dispense Refill Last Dose  . esomeprazole (NEXIUM) 40 MG capsule TAKE 1 CAPSULE BY MOUTH ON AN EMPTY STOMACH ONE HOUR BEFORE BREAKFAST OR AT BEDTIME AT LEAST 2 HOURS AFTER EATING 30 capsule 2 11/26/2016 at Unknown time  . sildenafil (VIAGRA) 50 MG tablet 1 tab by mouth once every 24 hours as needed for activity 30 tablet 5 unknown  . solifenacin (VESICARE) 5 MG tablet Take 5 mg by mouth daily.   11/26/2016 at Unknown time    Patient Stressors: Financial difficulties Substance abuse  Patient Strengths: Active sense of humor Capable of independent living Communication skills Motivation for treatment/growth  Treatment Modalities: Medication Management, Group therapy, Case management,  1 to 1 session with clinician, Psychoeducation, Recreational therapy.   Physician Treatment Plan for Primary Diagnosis: Substance induced mood disorder ([place]) Long Term Goal(s): Improvement in symptoms so as ready for discharge Improvement in symptoms so as ready for discharge   Short Term Goals: Ability to identify changes in lifestyle to reduce recurrence of condition will improve Ability to verbalize feelings will improve Ability to disclose and discuss suicidal ideas Ability to identify and  develop effective coping behaviors will improve Compliance with prescribed medications will improve Ability to identify triggers associated with substance abuse/mental health issues will improve  Medication Management: Evaluate patient's response, side effects, and tolerance of medication regimen.  Therapeutic Interventions: 1 to 1 sessions, Unit Group sessions and Medication administration.  Evaluation of Outcomes: Progressing  Physician Treatment Plan for Secondary Diagnosis: Principal Problem:   Substance induced mood disorder ([place]) Active Problems:   Cocaine use disorder, severe, dependence  ([place])  Long Term Goal(s): Improvement in symptoms so as ready for discharge Improvement in symptoms so as ready for discharge   Short Term Goals: Ability to identify changes in lifestyle to reduce recurrence of condition will improve Ability to verbalize feelings will improve Ability to disclose and discuss suicidal ideas Ability to identify and develop effective coping behaviors will improve Compliance with prescribed medications will improve Ability to identify triggers associated with substance abuse/mental health issues will improve     Medication Management: Evaluate patient's response, side effects, and tolerance of medication regimen.  Therapeutic Interventions: 1 to 1 sessions, Unit Group sessions and Medication administration.  Evaluation of Outcomes: Progressing   RN Treatment Plan for Primary Diagnosis: Substance induced mood disorder ([place]) Long Term Goal(s): Knowledge of disease and therapeutic regimen to maintain health will improve  Short Term Goals: Ability to remain free from injury will improve, Ability to demonstrate self-control and Ability to participate in decision making will improve  Medication Management: RN will administer medications as ordered by provider, will assess and evaluate patient's response and provide education to patient for prescribed medication. RN will report any adverse and/or side effects to prescribing provider.  Therapeutic Interventions: 1 on 1 counseling sessions, Psychoeducation, Medication administration, Evaluate responses to treatment, Monitor vital signs and CBGs as ordered, Perform/monitor CIWA, COWS, AIMS and Fall Risk screenings as ordered, Perform wound care treatments as ordered.  Evaluation of Outcomes: Progressing   LCSW Treatment Plan for Primary Diagnosis: Substance induced mood disorder ([place]) Long Term Goal(s): Safe transition to appropriate next level of care at discharge, Engage patient in therapeutic group addressing  interpersonal concerns.  Short Term Goals: Engage patient in aftercare planning with referrals and resources, Facilitate patient progression through stages of change regarding substance use diagnoses and concerns and Identify triggers associated with mental health/substance abuse issues  Therapeutic Interventions: Assess for all discharge needs, 1 to 1 time with Social worker, Explore available resources and support systems, Assess for adequacy in community support network, Educate family and significant other(s) on suicide prevention, Complete Psychosocial Assessment, Interpersonal group therapy.  Evaluation of Outcomes: Progressing   Progress in Treatment: Attending groups: Yes. Participating in groups: Yes. Taking medication as prescribed: Yes. Toleration medication: Yes. Family/Significant other contact made: Yes, individual(s) contacted:  pt's girlfriend/payee Patient understands diagnosis: Yes. Discussing patient identified problems/goals with staff: Yes. Medical problems stabilized or resolved: Yes. Denies suicidal/homicidal ideation: Yes. Issues/concerns per patient self-inventory: No. Other: n/a   New problem(s) identified: Patient is registered sex offender; very limited resources-[facility] is willing to screen patient for possible admission.   New Short Term/Long Term Goal(s): detox, medication management for mood stabilization; development of comprehensive mental wellness/sobriety plan.   Patient Goal: "to get into longer term treatment. I need long term help."   Discharge Plan or Barriers: Pt has been accepted to [facility] residential for Tuesday at 7:45AM. [facility] for outpatient services. AA/NA list for [employer]; [facility] pamphlet provided for additional community supports.   Reason for Continuation of Hospitalization: Depression Medication stabilization Withdrawal  symptoms  Estimated Length of Stay: Tuesday at [facility] screening scheduled.    Attendees: Patient: 11/29/2016 8:40 AM  Physician: Dr. Parke Poisson; Dr. Sanjuana Letters MD 11/29/2016 8:40 AM  Nursing: Raeanne Gathers RN 11/29/2016 8:40 AM  RN Care Manager: Lars Pinks CM 11/29/2016 8:40 AM  Social Worker: Press photographer, LCSW 11/29/2016 8:40 AM  Recreational Therapist: x 11/29/2016 8:40 AM  Other: Marvia Pickles NP 11/29/2016 8:40 AM  Other:  11/29/2016 8:40 AM  Other: 11/29/2016 8:40 AM    Scribe for Treatment Team: Kimber Relic Smart, LCSW 11/29/2016 8:40 AM

## 2016-11-29 NOTE — Progress Notes (Signed)
D Pt is observed sitting in the dayroom this morning, completing his daily assessment. He does not respond when this writer approaches him, but when he brings his half-completed survey to this Probation officer he says" I'm not doing too good right now..I can't finish this thing,,I'm upset right now so just give me my medicine". A On the survey, he wrote he is not experiencing SI today and he rates his depression, hopelessness and anxiety  "6/5/5", respectively. R Safety in place.

## 2016-11-29 NOTE — Progress Notes (Signed)
[facility] MD Progress Note  11/29/2016 2:50 PM Ethan [name]  MRN:  [id number] Subjective:   51 yo AAM, single, on SSI,  lives with his payee. Says he is a registered sex offender. Background history of SUD. Self presented to seek help. UDS was positive for cocaine and THC. Patient reports being tired of his lifestyle. He is using cocaine, THC and alcohol daily. Says he gets paranoid as he thinks his payee is trying to poison him. He has transient hallucinations in visual and auditory modality. Says he gets destructive and agitated under the influence. He is not allowed back to where he used to live.  Chart reviewed today. Patient discussed at team today.  Staff reports that he has been interacting a bit. Some irritability. No behavioral issues. SW confirms [facility] placement on Tuesday.  Seen today. Just had a phone call with his payee. Upset with her as he feels she is toying with his mind. Upset about her where about. Shared that they used to be in a relationship. Feels she is using  him because of his legal status. Says he is upset because he get let down by people. Says stigma of something he did almost two decades ago still hunts him. Says he gets kicked out of neighborhood once someone pulls him up on line. Feels frustrated as he has done fourteen years for a crime he states he did not commit.  Not expressing any suicidal thoughts. Pleased with [facility] discharge plan but worried that after that he is back to same uncertainty. Says that is what makes him turn to drugs.    Principal Problem: Substance induced mood disorder ([place]) Diagnosis:   Patient Active Problem List   Diagnosis Date Noted  . Substance induced mood disorder ([place]) [F19.94] 11/27/2016  . Cocaine use disorder, severe, dependence ([place]) [F14.20] 11/27/2016  . Scrotal abscess [N49.2] 12/23/2014  . HTN (hypertension) [I10] 12/22/2014  . GERD (gastroesophageal reflux disease) [K21.9] 12/22/2014  . Tobacco use disorder [F17.200]  12/22/2014  . 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 Cannabis use disorder, severe, dependence ([place]) [F12.20] 12/22/2014  . Alcohol use disorder, mild, abuse [F10.10] 12/22/2014  . Major depressive disorder, recurrent episode, moderate with anxious distress ([place]) [F33.1] 12/22/2014  . Antisocial personality disorder [F60.2] 12/22/2014  . Hepatitis C (treated) [B19.20] 09/01/2013   Total Time spent with patient: 20 minutes  Past Psychiatric History: As in H&P  Past Medical History:  Past Medical History:  Diagnosis Date  . Alcoholism ([place])   . Anxiety   . Depression   . GERD (gastroesophageal reflux disease)   . Heart murmur   . Hepatitis C    Treated in prison for 18 months.  Sounds like Ribaviron and Interferon  2009-2011  . Hypertension   . Peptic ulcer   . Substance abuse   . Urge urinary incontinence    07/20/2015:  evaluated by Dr. Kathie Rhodes, Urodynamics planned    Past Surgical History:  Procedure Laterality Date  . APPENDECTOMY    . INGUINAL HERNIA REPAIR Bilateral    Family History:  Family History  Problem Relation Age of Onset  . Breast cancer Sister   . Breast cancer Maternal Grandmother   . Colon polyps Brother   . Diabetes Sister        x 2  . Diabetes Maternal Aunt   . Diabetes Paternal Aunt   . Diabetes Mother        DM complications cause of death  . Alcohol  abuse Father   . COPD Neg Hx    Family Psychiatric  History: As in H&P Social History:  History  Alcohol Use  . 0.0 oz/week    Comment: 3 beers nightly     History  Drug Use  . Types: "Crack" cocaine    Comment: Occassionally MJ currently--in past crack cocaine--more than 16 years ago.    Social History   Social History  . Marital status: Significant Other    Spouse name: N/A  . Number of children: 3  . Years of education: GED   Occupational History  . 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 [employer]   Social History Main Topics  . Smoking status:  Current Every Day Smoker    Packs/day: 0.50    Years: 12.00    Types: Cigarettes    Start date: 07/14/1976  . Smokeless tobacco: Never Used     Comment: Stopped for 16 years.  Restarted with bout of depression/anxiety recently  . Alcohol use 0.0 oz/week     Comment: 3 beers nightly  . Drug use: Yes    Types: "Crack" cocaine     Comment: Occassionally MJ currently--in past crack cocaine--more than 16 years ago.  [name] Sexual activity: Yes    Partners: Female    Birth control/ protection: None     Comment: WIFE USED MAJUANA SATURDAY   Other Topics Concern  . None   Social History Narrative   Lost job and schooling secondary to "past"   Apparently for what he was incarcerated for.   Additional Social History:      Sleep: Good  Appetite:  Good  Current Medications: Current Facility-Administered Medications  Medication Dose Route Frequency Provider Last Rate Last Dose  . acetaminophen (TYLENOL) tablet 650 mg  650 mg Oral Q6H PRN Laverle Hobby, PA-C   650 mg at 11/28/16 1552  . alum & mag hydroxide-simeth (MAALOX/MYLANTA) 200-200-20 MG/5ML suspension 30 mL  30 mL Oral Q4H PRN Patriciaann Clan E, PA-C      . chlordiazePOXIDE (LIBRIUM) capsule 25 mg  25 mg Oral Q8H PRN Artist Beach, MD   25 mg at 11/29/16 1209  . hydrOXYzine (ATARAX/VISTARIL) tablet 25 mg  25 mg Oral Q6H PRN Laverle Hobby, PA-C   25 mg at 11/28/16 1552  . loperamide (IMODIUM) capsule 2-4 mg  2-4 mg Oral PRN Patriciaann Clan E, PA-C      . magnesium hydroxide (MILK OF MAGNESIA) suspension 30 mL  30 mL Oral Daily PRN Laverle Hobby, PA-C      . multivitamin with minerals tablet 1 tablet  1 tablet Oral Daily Laverle Hobby, PA-C   1 tablet at 11/29/16 2025  . nicotine (NICODERM CQ - dosed in mg/24 hours) patch 21 mg  21 mg Transdermal Daily Cobos, Myer Peer, MD   21 mg at 11/29/16 0823  . ondansetron (ZOFRAN-ODT) disintegrating tablet 4 mg  4 mg Oral Q6H PRN Laverle Hobby, PA-C      . pantoprazole  (PROTONIX) EC tablet 40 mg  40 mg Oral Daily Laverle Hobby, PA-C   40 mg at 11/29/16 4270  . risperiDONE (RISPERDAL) tablet 0.5 mg  0.5 mg Oral BID Artist Beach, MD   0.5 mg at 11/29/16 6237  . thiamine (B-1) injection 100 mg  100 mg Intramuscular Once Patriciaann Clan E, PA-C      . thiamine (VITAMIN B-1) tablet 100 mg  100 mg Oral Daily Patriciaann Clan  E, PA-C   100 mg at 11/29/16 5726  . traZODone (DESYREL) tablet 50 mg  50 mg Oral QHS,MR X 1 Simon, Spencer E, PA-C        Lab Results:  No results found for this or any previous visit (from the past 48 hour(s)).  Blood Alcohol level:  Lab Results  Component Value Date   ETH <5 11/26/2016   ETH <5 20/35/5974    Metabolic Disorder Labs: Lab Results  Component Value Date   HGBA1C 6.1 (H) 12/22/2014   MPG 123 11/11/2014   MPG 126 (H) 09/01/2013   No results found for: PROLACTIN Lab Results  Component Value Date   CHOL 137 12/22/2014   TRIG 38 12/22/2014   HDL 36 (L) 12/22/2014   CHOLHDL 3.8 12/22/2014   VLDL 8 12/22/2014   LDLCALC 93 12/22/2014   LDLCALC 77 11/11/2014    Physical Findings: AIMS: Facial and Oral Movements Muscles of Facial Expression: None, normal Lips and Perioral Area: None, normal Jaw: None, normal Tongue: None, normal,Extremity Movements Upper (arms, wrists, hands, fingers): None, normal Lower (legs, knees, ankles, toes): None, normal, Trunk Movements Neck, shoulders, hips: None, normal, Overall Severity Severity of abnormal movements (highest score from questions above): None, normal Incapacitation due to abnormal movements: None, normal Patient's awareness of abnormal movements (rate only patient's report): No Awareness, Dental Status Current problems with teeth and/or dentures?: No Does patient usually wear dentures?: No  CIWA:  CIWA-Ar Total: 1 COWS:     Musculoskeletal: Strength & Muscle Tone: within normal limits Gait & Station: normal Patient leans: N/A  Psychiatric Specialty  Exam: Physical Exam  Constitutional: He is oriented to person, place, and time. He appears well-developed and well-nourished.  HENT:  Head: Normocephalic and atraumatic.  Neck: Normal range of motion. Neck supple.  Respiratory: Effort normal.  Neurological: He is alert and oriented to person, place, and time.  Skin: Skin is warm and dry.  Psychiatric:  As above    ROS  Blood pressure 118/87, pulse 79, temperature 98.8 F (37.1 C), temperature source Oral, resp. rate 18, height 5\' 9"  (1.753 m), weight 77.1 kg (170 lb), SpO2 99 %.Body mass index is 25.1 kg/m.  General Appearance: was out making a phone call just before interview. Upset and irritable.   Eye Contact:  Good  Speech:  Clear and Coherent and Normal Rate  Volume:  Normal  Mood:  Negative ruminations.   Affect:  Appropriate and Restricted  Thought Process:  Linear  Orientation:  Full (Time, Place, and Person)  Thought Content:  Negative ruminations. No delusional theme. No hallucination in any modality. No thoughts of violence.   Suicidal Thoughts:  No active suicidal thoughts  Homicidal Thoughts:  No  Memory:  Immediate;   Good Recent;   Fair Remote;   Fair  Judgement:  Fair  Insight:  Good  Psychomotor Activity:  Normal  Concentration:  Concentration: Good and Attention Span: Good  Recall:  [place] of Knowledge:  Good  Language:  Good  Akathisia:  Negative  Handed:    AIMS (if indicated):     Assets:  Communication Skills Desire for Improvement Physical Health Resilience  ADL's:  Intact  Cognition:  WNL  Sleep:  Number of Hours: 6    Assessment and Plan  Patient is smoothly detoxong from psychoactive substances. No suicidal or homicidal thoughts. Psychosis has resolved.  Hopeful inpatient rehab at [facility] on Tuesday.  Psychiatric: SUD SIMD  Medical: HTN GERD HCV  Psychosocial:  Limited support Stigmatized   PLAN: 1. Continue current regimen 2. Continue to monitor mood, behavior and  interaction with peers       Artist Beach, MD 11/29/2016, 2:50 PMPatient ID: Waldemar Dickens, male   DOB: 1965/05/07, 51 y.o.   MRN: [id number]

## 2016-11-29 NOTE — [facility] Group Notes (Signed)
[place] LCSW Group Therapy  11/29/2016 3:41 PM  Type of Therapy:  Group Therapy  Participation Level:  Minimal  Participation Quality:  Attentive  Affect:  Depressed and Irritable  Cognitive:  Lacking  Insight:  Limited  Engagement in Therapy:  Lacking  Modes of Intervention:  Discussion, Education, Exploration, Socialization and Support  Summary of Progress/Problems: Feelings around Relapse. Group members discussed the meaning of relapse and shared personal stories of relapse, how it affected them and others, and how they perceived themselves during this time. Group members were encouraged to identify triggers, warning signs and coping skills used when facing the possibility of relapse. Social supports were discussed and explored in detail. Post Acute Withdrawal Syndrome (handout provided) was introduced and examined. Pt's were encouraged to ask questions, talk about key points associated with PAWS, and process this information in terms of relapse prevention.    Ravenna Legore N Smart LCSW 11/29/2016, 3:41 PM

## 2016-11-29 NOTE — Plan of Care (Signed)
Problem: Activity: Goal: Interest or engagement in activities will improve Outcome: Progressing Patient observed interacting with peers in the dayroom.

## 2016-11-29 NOTE — Progress Notes (Signed)
Nursing Progress Note [id number]  D) Patient presents calm, pleasant and cooperative. Patient was observed up in the dayroom interacting with peers at the start of shift. Patient returned to his room after group. Patient appears to be sleeping for most of the night.  Patient denieed SI/HI/AVH or pain. Patient contracts for safety on the unit.  A) Emotional support given. 1:1 interaction and active listening provided. No medications requested this shift. Patient already sleeping at time of scheduled medications for sleep. Snacks and fluids provided. Opportunities for questions or concerns presented to patient. Patient encouraged to continue to work on treatment goals. Labs, vital signs and patient behavior monitored throughout shift. Patient safety maintained with q15 min safety checks. Low fall risk precautions in place and reviewed with patient; patient verbalized understanding.  R) Patient remains safe on the unit at this time. Patient is resting in bed without complaints. Will continue to monitor.

## 2016-11-30 MED ORDER — HYDROXYZINE HCL 25 MG PO TABS
25.0000 mg | ORAL_TABLET | Freq: Four times a day (QID) | ORAL | Status: DC | PRN
  Administered 2016-11-30 – 2016-12-02 (×5): 25 mg via ORAL
  Filled 2016-11-30 (×5): qty 1

## 2016-11-30 NOTE — Progress Notes (Signed)
D    Pt is pleasant on approach  He is cooperative and interacts appropriately with others    He denies SI/HI   He is compliant with treatment   He complained of poor sleep for the past few nights A    Verbal support given   Medications administered and effectiveness monitored  Discussed better sleeping habits  Q 15 min checks R   Pt is safe at present and receptive to verbal support

## 2016-11-30 NOTE — Progress Notes (Signed)
The patient attended the evening A.A.meeting and was appropriate.  

## 2016-11-30 NOTE — Progress Notes (Signed)
D:  Ethan [name] has been in his room much of the morning.  He denies SI/HI or A/V hallucinations.  He stated that he felt like the medications were helping him but "I feel tired."  He stated that was planning on resting some today.  He completed his self inventory and reported that his depression was 4/10, hopelessness 7/10 and his anxiety is 5/10.  He stated that his goal for today was "studying the Bible, get back to working out."  He will accomplish his goal by "read."    A:  1:1 provided for support and encouragement.  Medications given as ordered.  Q 15 minute checks maintained for safety.  R:  Adrick remains safe on the unit.  Encouraged participation in group and unit activities.  We will continue to monitor the progress towards his goals.

## 2016-11-30 NOTE — Progress Notes (Signed)
[facility] MD Progress Note  11/30/2016 2:12 PM Ethan [name]  MRN:  [id number] Subjective:   51 yo AAM, single, on SSI,  lives with his payee. Says he is a registered sex offender. Background history of SUD. Self presented to seek help. UDS was positive for cocaine and THC. Patient reports being tired of his lifestyle. He is using cocaine, THC and alcohol daily. Says he gets paranoid as he thinks his payee is trying to poison him. He has transient hallucinations in visual and auditory modality. Says he gets destructive and agitated under the influence. He is not allowed back to where he used to live.  Chart reviewed today. Patient discussed at team today.  Staff reports that he reports anxiety at times. Vistaril has been added. He tends to stay in his room more. He participates at groups. No behavioral issues. Has not been observed to be internally stimulated.   Seen today. Was reading the Bible in his room. Says he was told he might be moving into another room. Patient interpreted this as being related to his records. Reassured that we reassign rooms based on various reasons. Reassured that he had not done anything wrong and decision is not based on any bias or prejudice. No hallucinations in any modality. Not expressing any abnormal belief. No suicidal thoughts. No thoughts of violence. No side effects from his medications.   Principal Problem: Substance induced mood disorder ([place]) Diagnosis:   Patient Active Problem List   Diagnosis Date Noted  . Substance induced mood disorder ([place]) [F19.94] 11/27/2016  . Cocaine use disorder, severe, dependence ([place]) [F14.20] 11/27/2016  . Scrotal abscess [N49.2] 12/23/2014  . HTN (hypertension) [I10] 12/22/2014  . GERD (gastroesophageal reflux disease) [K21.9] 12/22/2014  . Tobacco use disorder [F17.200] 12/22/2014  . 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 Cannabis use disorder, severe, dependence ([place]) [F12.20] 12/22/2014  . Alcohol use  disorder, mild, abuse [F10.10] 12/22/2014  . Major depressive disorder, recurrent episode, moderate with anxious distress ([place]) [F33.1] 12/22/2014  . Antisocial personality disorder [F60.2] 12/22/2014  . Hepatitis C (treated) [B19.20] 09/01/2013   Total Time spent with patient: 20 minutes  Past Psychiatric History: As in H&P  Past Medical History:  Past Medical History:  Diagnosis Date  . Alcoholism ([place])   . Anxiety   . Depression   . GERD (gastroesophageal reflux disease)   . Heart murmur   . Hepatitis C    Treated in prison for 18 months.  Sounds like Ribaviron and Interferon  2009-2011  . Hypertension   . Peptic ulcer   . Substance abuse   . Urge urinary incontinence    07/20/2015:  evaluated by Dr. Kathie Rhodes, Urodynamics planned    Past Surgical History:  Procedure Laterality Date  . APPENDECTOMY    . INGUINAL HERNIA REPAIR Bilateral    Family History:  Family History  Problem Relation Age of Onset  . Breast cancer Sister   . Breast cancer Maternal Grandmother   . Colon polyps Brother   . Diabetes Sister        x 2  . Diabetes Maternal Aunt   . Diabetes Paternal Aunt   . Diabetes Mother        DM complications cause of death  . Alcohol abuse Father   . COPD Neg Hx    Family Psychiatric  History: As in H&P Social History:  History  Alcohol Use  . 0.0 oz/week    Comment: 3 beers nightly  History  Drug Use  . Types: "Crack" cocaine    Comment: Occassionally MJ currently--in past crack cocaine--more than 16 years ago.    Social History   Social History  . Marital status: Significant Other    Spouse name: N/A  . Number of children: 3  . Years of education: GED   Occupational History  . 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 [employer]   Social History Main Topics  . Smoking status: Current Every Day Smoker    Packs/day: 0.50    Years: 12.00    Types: Cigarettes    Start date: 07/14/1976  . Smokeless tobacco: Never Used     Comment:  Stopped for 16 years.  Restarted with bout of depression/anxiety recently  . Alcohol use 0.0 oz/week     Comment: 3 beers nightly  . Drug use: Yes    Types: "Crack" cocaine     Comment: Occassionally MJ currently--in past crack cocaine--more than 16 years ago.  [name] Sexual activity: Yes    Partners: Female    Birth control/ protection: None     Comment: WIFE USED MAJUANA SATURDAY   Other Topics Concern  . None   Social History Narrative   Lost job and schooling secondary to "past"   Apparently for what he was incarcerated for.   Additional Social History:      Sleep: Good  Appetite:  Good  Current Medications: Current Facility-Administered Medications  Medication Dose Route Frequency Provider Last Rate Last Dose  . acetaminophen (TYLENOL) tablet 650 mg  650 mg Oral Q6H PRN Laverle Hobby, PA-C   650 mg at 11/28/16 1552  . alum & mag hydroxide-simeth (MAALOX/MYLANTA) 200-200-20 MG/5ML suspension 30 mL  30 mL Oral Q4H PRN Patriciaann Clan E, PA-C      . chlordiazePOXIDE (LIBRIUM) capsule 25 mg  25 mg Oral Q8H PRN Artist Beach, MD   25 mg at 11/29/16 2130  . hydrOXYzine (ATARAX/VISTARIL) tablet 25 mg  25 mg Oral Q6H PRN Ashni Lonzo, Laruth Bouchard, MD      . magnesium hydroxide (MILK OF MAGNESIA) suspension 30 mL  30 mL Oral Daily PRN Laverle Hobby, PA-C      . multivitamin with minerals tablet 1 tablet  1 tablet Oral Daily Laverle Hobby, PA-C   1 tablet at 11/30/16 0801  . nicotine (NICODERM CQ - dosed in mg/24 hours) patch 21 mg  21 mg Transdermal Daily Cobos, Myer Peer, MD   21 mg at 11/30/16 0800  . pantoprazole (PROTONIX) EC tablet 40 mg  40 mg Oral Daily Laverle Hobby, PA-C   40 mg at 11/30/16 0801  . risperiDONE (RISPERDAL) tablet 0.5 mg  0.5 mg Oral BID Christie Viscomi, Laruth Bouchard, MD   0.5 mg at 11/30/16 0802  . thiamine (B-1) injection 100 mg  100 mg Intramuscular Once Patriciaann Clan E, PA-C      . thiamine (VITAMIN B-1) tablet 100 mg  100 mg Oral Daily Patriciaann Clan E,  PA-C   100 mg at 11/30/16 0801  . traZODone (DESYREL) tablet 50 mg  50 mg Oral QHS,MR X 1 Laverle Hobby, PA-C   50 mg at 11/29/16 2131    Lab Results:  No results found for this or any previous visit (from the past 48 hour(s)).  Blood Alcohol level:  Lab Results  Component Value Date   [facility] <5 11/26/2016   ETH <5 70/26/3785    Metabolic Disorder Labs: Lab Results  Component  Value Date   HGBA1C 6.1 (H) 12/22/2014   MPG 123 11/11/2014   MPG 126 (H) 09/01/2013   No results found for: PROLACTIN Lab Results  Component Value Date   CHOL 137 12/22/2014   TRIG 38 12/22/2014   HDL 36 (L) 12/22/2014   CHOLHDL 3.8 12/22/2014   VLDL 8 12/22/2014   LDLCALC 93 12/22/2014   LDLCALC 77 11/11/2014    Physical Findings: AIMS: Facial and Oral Movements Muscles of Facial Expression: None, normal Lips and Perioral Area: None, normal Jaw: None, normal Tongue: None, normal,Extremity Movements Upper (arms, wrists, hands, fingers): None, normal Lower (legs, knees, ankles, toes): None, normal, Trunk Movements Neck, shoulders, hips: None, normal, Overall Severity Severity of abnormal movements (highest score from questions above): None, normal Incapacitation due to abnormal movements: None, normal Patient's awareness of abnormal movements (rate only patient's report): No Awareness, Dental Status Current problems with teeth and/or dentures?: No Does patient usually wear dentures?: No  CIWA:  CIWA-Ar Total: 2 COWS:     Musculoskeletal: Strength & Muscle Tone: within normal limits Gait & Station: normal Patient leans: N/A  Psychiatric Specialty Exam: Physical Exam  Constitutional: He is oriented to person, place, and time. He appears well-developed and well-nourished.  HENT:  Head: Normocephalic and atraumatic.  Neck: Normal range of motion. Neck supple.  Respiratory: Effort normal.  Neurological: He is alert and oriented to person, place, and time.  Skin: Skin is warm and dry.   Psychiatric:  As above    ROS  Blood pressure 103/77, pulse 70, temperature 97.9 F (36.6 C), temperature source Oral, resp. rate 18, height 5\' 9"  (1.753 m), weight 77.1 kg (170 lb), SpO2 99 %.Body mass index is 25.1 kg/m.  General Appearance: Neatly dressed, calm and cooperative. Appropriate behavior. Not in any distress  Eye Contact:  Good  Speech: Spontaneous, normal prosody. Normal tone and rate.   Volume:  Normal  Mood:  Less irritability today.   Affect:  Appropriate and Restricted  Thought Process:  Linear  Orientation:  Full (Time, Place, and Person)  Thought Content:  Negative ruminations. No delusional theme.  No hallucination in any modality. No thoughts of violence.   Suicidal Thoughts:  No active suicidal thoughts  Homicidal Thoughts:  No  Memory:  WNL  Judgement:  Better  Insight:  Good  Psychomotor Activity:  Normal  Concentration:  Concentration: Good and Attention Span: Good  Recall:  Good  Fund of Knowledge:  Good  Language:  Good  Akathisia:  Negative  Handed:    AIMS (if indicated):     Assets:  Communication Skills Desire for Improvement Physical Health Resilience  ADL's:  Intact  Cognition:  WNL  Sleep:  Number of Hours: 5.75    Assessment and Plan  Patient is smoothly detoxong from psychoactive substances. No suicidal or homicidal thoughts. Psychosis has resolved.  Hopeful inpatient rehab at [facility] on Tuesday.  Psychiatric: SUD SIMD  Medical: HTN GERD HCV  Psychosocial:  Limited support Stigmatized   PLAN: 1. Continue current regimen 2. Continue to monitor mood, behavior and interaction with peers       Artist Beach, MD 11/30/2016, 2:12 PMPatient ID: Waldemar Dickens, male   DOB: 12-Sep-1965, 51 y.o.   MRN: [id number] Patient ID: PAYTEN BEAUMIER, male   DOB: 05/27/1965, 51 y.o.   MRN: [id number]

## 2016-11-30 NOTE — Plan of Care (Signed)
Problem: Activity: Goal: Interest or engagement in activities will improve Outcome: Progressing He continues to stay in his room and didn't attend groups today.  Continued to encourage participation in group and unit activities.

## 2016-11-30 NOTE — [facility] Group Notes (Signed)
[facility] LCSW Group Therapy Note  11/16/2016 at  10:20 to 11:15 AM  Type of Therapy and Topic:  Group Therapy: Avoiding Self-Sabotaging and Enabling Behaviors  Participation Level:  Did Not Attend; invited to participate yet did not despite overhead announcement and encouragement by staff   Description of Group The main focus of today's process group to discuss what "self-sabotage" means and use motivational iInterviewing to discuss what benefits, negative or positive, were involved in a self-identified self-sabotaging behavior. We then talked about reasons the patient may want to change the behavior and their current desire to change.   Therapeutic molalities: Cognitive Behavioral Therapy Person-Centered Therapy Motivational Interviewing  Therapeutic Goals: 1. Patients will demonstrate understanding of the concept of self sabotage 2. Patients will be able to identify pros and cons of their behaviors 3. Patients will be able to identify at least two motivating factors for l of their desire for change   Sheilah Pigeon, LCSW

## 2016-12-01 NOTE — [facility] Group Notes (Signed)
[facility] LCSW Group Therapy Note     12/01/2016 10:15 to 11:15 AM  Type of Therapy and Topic: Group Therapy: Feelings Around [place] a Supportive Framework and Supporting Oneself When Supports Not Available  Participation Level: Did Not Attend; invited to participate yet did not despite overhead announcement and encouragement by staff  Description of Group:  Patients first processed thoughts and feelings about up coming discharge. These included fears of upcoming changes, lack of change, new living environments, judgements and expectations from others and overall stigma of MH issues. We then discussed what is a supportive framework? What does it look like feel like and how do I discern it from and unhealthy non-supportive network? Learn how to cope when supports are not helpful and don't support you. Discuss what to do when your family/friends are not supportive.   Therapeutic Goals Addressed in Processing Group:  1. Patient will identify one healthy supportive network that they can use at discharge. 2. Patient will identify one factor of a supportive framework and how to tell it from an unhealthy network. 3. Patient able to identify one coping skill to use when they do not have positive supports from others. 4. Patient will demonstrate ability to communicate their needs through discussion and/or role plays.  Summary of Patient Progress:  None as patient did not attend  Sheilah Pigeon, LCSW

## 2016-12-01 NOTE — Progress Notes (Signed)
D    Pt is pleasant on approach  He is cooperative and interacts appropriately with others    He denies SI/HI   He is compliant with treatment   He complained of poor sleep for the past few nights  Pt had a disturbing phone call just before bedtime and was arguing with his girlfriend then he could not fall asleep and needed extra medications A    Verbal support given   Medications administered and effectiveness monitored  Discussed better sleeping habits and suggested he not get into arguments just before bed time which would help him fall asleep better Q 15 min checks R   Pt is safe at present and receptive to verbal support

## 2016-12-01 NOTE — Plan of Care (Signed)
Problem: Activity: Goal: Interest or engagement in activities will improve Outcome: Progressing Patient has been spending more time in the milieu. Goal: Sleeping patterns will improve Outcome: Not Progressing Patient continues to nap at times throughout the day  Problem: Coping: Goal: Ability to verbalize frustrations and anger appropriately will improve Outcome: Progressing Patient has been appropriate in his verbalizations of issues. Goal: Ability to demonstrate self-control will improve Outcome: Progressing Patient's behavior has been appropriate.  Problem: Safety: Goal: Periods of time without injury will increase Outcome: Progressing No injury this shift.

## 2016-12-01 NOTE — Progress Notes (Signed)
Data. Patient denies SI/HI/AVH. Verbally contracts for safety on the unit and to come to staff before acting of any self harm thoughts/feelings. Patient slept most of the morning, but in the afternoon he has been interacting well with staff and other patients.  Action. Emotional support and encouragement offered. Education provided on medication, indications and side effect. Q 15 minute checks done for safety. Response. Safety on the unit maintained through 15 minute checks.  Medications taken as prescribed. Remained calm and appropriate through out shift.

## 2016-12-01 NOTE — Progress Notes (Signed)
Patient did attend the evening speaker AA meeting.  

## 2016-12-01 NOTE — Social Work (Signed)
Clinical Social Work Note  Per nurse, patient is now stating he does not want to go to [facility] for rehab at discharge, wants to discharge "home" instead.  Weekday CSW alerted.  Selmer Dominion, LCSW 12/01/2016, 12:36 PM

## 2016-12-01 NOTE — Progress Notes (Signed)
[facility] MD Progress Note  12/01/2016 2:59 PM Ethan [name]  MRN:  [id number] Subjective:   51 yo AAM, single, on SSI,  lives with his payee. Says he is a registered sex offender. Background history of SUD. Self presented to seek help. UDS was positive for cocaine and THC. Patient reports being tired of his lifestyle. He is using cocaine, THC and alcohol daily. Says he gets paranoid as he thinks his payee is trying to poison him. He has transient hallucinations in visual and auditory modality. Says he gets destructive and agitated under the influence. He is not allowed back to where he used to live.  Chart reviewed today. Patient discussed at team today.  Staff reports that he was a bit upset yesterday evening. This was after a phone conversation. He calmed down afterwards. He has been pleasant. No behavioral issues. He has been taking his medications as prescribed. Informed his SW that he no longer wants to go to [facility]. Says he can return back home.   Seen today. Tells me that he spoke with his payee. They have mended their relationship. Says she would take him back once he stays on his medications, go to meetings and stay away from substances. Says he feels good on his medications. He is able to think straight. No abnormal perception. He does not have any violent thoughts. He is not craving for substances. He does not have any violent thoughts. No homicidal or suicidal thoughts. Patient hopes he can go back home tomorrow.   Principal Problem: Substance induced mood disorder ([place]) Diagnosis:   Patient Active Problem List   Diagnosis Date Noted  . Substance induced mood disorder ([place]) [F19.94] 11/27/2016  . Cocaine use disorder, severe, dependence ([place]) [F14.20] 11/27/2016  . Scrotal abscess [N49.2] 12/23/2014  . HTN (hypertension) [I10] 12/22/2014  . GERD (gastroesophageal reflux disease) [K21.9] 12/22/2014  . Tobacco use disorder [F17.200] 12/22/2014  . Stimulant use disorder (COCAINE)  [F15.90] 12/22/2014  . Cannabis use disorder, severe, dependence ([place]) [F12.20] 12/22/2014  . Alcohol use disorder, mild, abuse [F10.10] 12/22/2014  . Major depressive disorder, recurrent episode, moderate with anxious distress ([place]) [F33.1] 12/22/2014  . Antisocial personality disorder [F60.2] 12/22/2014  . Hepatitis C (treated) [B19.20] 09/01/2013   Total Time spent with patient: 20 minutes  Past Psychiatric History: As in H&P  Past Medical History:  Past Medical History:  Diagnosis Date  . Alcoholism ([place])   . Anxiety   . Depression   . GERD (gastroesophageal reflux disease)   . Heart murmur   . Hepatitis C    Treated in prison for 18 months.  Sounds like Ribaviron and Interferon  2009-2011  . Hypertension   . Peptic ulcer   . Substance abuse   . Urge urinary incontinence    07/20/2015:  evaluated by Dr. Kathie Rhodes, Urodynamics planned    Past Surgical History:  Procedure Laterality Date  . APPENDECTOMY    . INGUINAL HERNIA REPAIR Bilateral    Family History:  Family History  Problem Relation Age of Onset  . Breast cancer Sister   . Breast cancer Maternal Grandmother   . Colon polyps Brother   . Diabetes Sister        x 2  . Diabetes Maternal Aunt   . Diabetes Paternal Aunt   . Diabetes Mother        DM complications cause of death  . Alcohol abuse Father   . COPD Neg Hx    Family Psychiatric  History: As  in H&P Social History:  History  Alcohol Use  . 0.0 oz/week    Comment: 3 beers nightly     History  Drug Use  . Types: "Crack" cocaine    Comment: Occassionally MJ currently--in past crack cocaine--more than 16 years ago.    Social History   Social History  . Marital status: Significant Other    Spouse name: N/A  . Number of children: 3  . Years of education: GED   Occupational History  . 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 [employer]   Social History Main Topics  . Smoking status: Current Every Day Smoker    Packs/day: 0.50     Years: 12.00    Types: Cigarettes    Start date: 07/14/1976  . Smokeless tobacco: Never Used     Comment: Stopped for 16 years.  Restarted with bout of depression/anxiety recently  . Alcohol use 0.0 oz/week     Comment: 3 beers nightly  . Drug use: Yes    Types: "Crack" cocaine     Comment: Occassionally MJ currently--in past crack cocaine--more than 16 years ago.  [name] Sexual activity: Yes    Partners: Female    Birth control/ protection: None     Comment: WIFE USED MAJUANA SATURDAY   Other Topics Concern  . None   Social History Narrative   Lost job and schooling secondary to "past"   Apparently for what he was incarcerated for.   Additional Social History:      Sleep: Good  Appetite:  Good  Current Medications: Current Facility-Administered Medications  Medication Dose Route Frequency Provider Last Rate Last Dose  . acetaminophen (TYLENOL) tablet 650 mg  650 mg Oral Q6H PRN Laverle Hobby, PA-C   650 mg at 11/28/16 1552  . alum & mag hydroxide-simeth (MAALOX/MYLANTA) 200-200-20 MG/5ML suspension 30 mL  30 mL Oral Q4H PRN Patriciaann Clan E, PA-C      . chlordiazePOXIDE (LIBRIUM) capsule 25 mg  25 mg Oral Q8H PRN Thales Knipple, Laruth Bouchard, MD   25 mg at 11/30/16 2118  . hydrOXYzine (ATARAX/VISTARIL) tablet 25 mg  25 mg Oral Q6H PRN Artist Beach, MD   25 mg at 12/01/16 0815  . magnesium hydroxide (MILK OF MAGNESIA) suspension 30 mL  30 mL Oral Daily PRN Laverle Hobby, PA-C      . multivitamin with minerals tablet 1 tablet  1 tablet Oral Daily Laverle Hobby, PA-C   1 tablet at 12/01/16 0813  . nicotine (NICODERM CQ - dosed in mg/24 hours) patch 21 mg  21 mg Transdermal Daily Cobos, Myer Peer, MD   21 mg at 12/01/16 0814  . pantoprazole (PROTONIX) EC tablet 40 mg  40 mg Oral Daily Laverle Hobby, PA-C   40 mg at 12/01/16 7672  . risperiDONE (RISPERDAL) tablet 0.5 mg  0.5 mg Oral BID Artist Beach, MD   0.5 mg at 12/01/16 0814  . thiamine (B-1) injection 100 mg  100  mg Intramuscular Once Patriciaann Clan E, PA-C      . thiamine (VITAMIN B-1) tablet 100 mg  100 mg Oral Daily Patriciaann Clan E, PA-C   100 mg at 12/01/16 0947  . traZODone (DESYREL) tablet 50 mg  50 mg Oral QHS,MR X 1 Laverle Hobby, PA-C   50 mg at 11/30/16 2333    Lab Results:  No results found for this or any previous visit (from the past 48 hour(s)).  Blood Alcohol level:  Lab Results  Component Value Date   ETH <5 11/26/2016   ETH <5 46/27/0350    Metabolic Disorder Labs: Lab Results  Component Value Date   HGBA1C 6.1 (H) 12/22/2014   MPG 123 11/11/2014   MPG 126 (H) 09/01/2013   No results found for: PROLACTIN Lab Results  Component Value Date   CHOL 137 12/22/2014   TRIG 38 12/22/2014   HDL 36 (L) 12/22/2014   CHOLHDL 3.8 12/22/2014   VLDL 8 12/22/2014   LDLCALC 93 12/22/2014   LDLCALC 77 11/11/2014    Physical Findings: AIMS: Facial and Oral Movements Muscles of Facial Expression: None, normal Lips and Perioral Area: None, normal Jaw: None, normal Tongue: None, normal,Extremity Movements Upper (arms, wrists, hands, fingers): None, normal Lower (legs, knees, ankles, toes): None, normal, Trunk Movements Neck, shoulders, hips: None, normal, Overall Severity Severity of abnormal movements (highest score from questions above): None, normal Incapacitation due to abnormal movements: None, normal Patient's awareness of abnormal movements (rate only patient's report): No Awareness, Dental Status Current problems with teeth and/or dentures?: No Does patient usually wear dentures?: No  CIWA:  CIWA-Ar Total: 4 COWS:     Musculoskeletal: Strength & Muscle Tone: within normal limits Gait & Station: normal Patient leans: N/A  Psychiatric Specialty Exam: Physical Exam  Constitutional: He is oriented to person, place, and time. He appears well-developed and well-nourished.  HENT:  Head: Normocephalic and atraumatic.  Neck: Normal range of motion. Neck supple.   Respiratory: Effort normal.  Neurological: He is alert and oriented to person, place, and time.  Skin: Skin is warm and dry.  Psychiatric:  As above    ROS  Blood pressure 124/74, pulse 86, temperature 97.6 F (36.4 C), temperature source Oral, resp. rate 18, height 5\' 9"  (1.753 m), weight 77.1 kg (170 lb), SpO2 99 %.Body mass index is 25.1 kg/m.  General Appearance: Neatly dressed, calm and cooperative. Pleasant and cooperative.  Appropriate behavior. No EPS  Eye Contact:  Good  Speech: Spontaneous, normal prosody. Normal tone and rate.   Volume:  Normal  Mood:  Euthymic   Affect:  Full range and appropriate.   Thought Process:  Linear and goal directed  Orientation:  Full (Time, Place, and Person)  Thought Content:  No delusional theme. No preoccupation with violent thoughts. No negative ruminations. No obsession.  No hallucination in any modality.   Suicidal Thoughts:  None  Homicidal Thoughts:  No  Memory:  WNL  Judgement:  Better  Insight:  Good  Psychomotor Activity:  Normal  Concentration:  Concentration: Good and Attention Span: Good  Recall:  Good  Fund of Knowledge:  Good  Language:  Good  Akathisia:  Negative  Handed:    AIMS (if indicated):     Assets:  Communication Skills Desire for Improvement Physical Health Resilience  ADL's:  Intact  Cognition:  WNL  Sleep:  Number of Hours: 6    Assessment and Plan  Patient has completely come off  psychoactive substances. No suicidal or homicidal thoughts. No psychosis or mania. He has changed his mind about rehab.  He plans to return back home tomorrow.   Psychiatric: SUD SIMD  Medical: HTN GERD HCV  Psychosocial:  Limited support Stigmatized   PLAN: 1. Continue current regimen 2. Continue to monitor mood, behavior and interaction with peers 3. SW would gather collateral from his payee 4. Discharge home tomorrow if no risk concerns       Artist Beach, MD 12/01/2016, 2:59 PM

## 2016-12-02 MED ORDER — TRAZODONE HCL 50 MG PO TABS: 50.0000 mg | ORAL_TABLET | Freq: Every evening | ORAL | 0 refills | Status: DC | PRN

## 2016-12-02 MED ORDER — HYDROXYZINE HCL 25 MG PO TABS: 25.0000 mg | ORAL_TABLET | Freq: Four times a day (QID) | ORAL | 0 refills | Status: DC | PRN

## 2016-12-02 MED ORDER — RISPERIDONE 0.5 MG PO TABS: 0.5000 mg | ORAL_TABLET | Freq: Two times a day (BID) | ORAL | 0 refills | Status: DC

## 2016-12-02 MED ORDER — NICOTINE 21 MG/24HR TD PT24: 21.0000 mg | MEDICATED_PATCH | Freq: Every day | TRANSDERMAL | 0 refills | Status: DC

## 2016-12-02 MED ORDER — PANTOPRAZOLE SODIUM 40 MG PO TBEC: 40.0000 mg | DELAYED_RELEASE_TABLET | Freq: Every day | ORAL | 0 refills | Status: DC

## 2016-12-02 NOTE — Progress Notes (Signed)
Recreation Therapy Notes  Date: 12/02/16 Time: 0930 Location: 300 Hall Dayroom  Group Topic: Stress Management  Goal Area(s) Addresses:  Patient will verbalize importance of using healthy stress management.  Patient will identify positive emotions associated with healthy stress management.   Intervention: Stress Management  Activity :  Peaceful Waves.  LRT introduced the stress management technique of guided imagery.  LRT played the sounds of waves at the beach from [place] and read a script to allow patients to visualize being at the beach at sunrise.  Patients were to follow along as LRT read script to engage in the activity.    Education:  Stress Management, Discharge Planning.   Education Outcome: Acknowledges edcuation/In group clarification offered/Needs additional education  Clinical Observations/Feedback: Pt did not attend group.   Victorino Sparrow, LRT/CTRS         Ria Comment, Plato Alspaugh A 12/02/2016 11:12 AM

## 2016-12-02 NOTE — BHH Suicide Risk Assessment (Signed)
[facility] Discharge Suicide Risk Assessment   Principal Problem: Substance induced mood disorder [facility]) Discharge Diagnoses:  Patient Active Problem List   Diagnosis Date Noted  . Substance induced mood disorder ([place]) [F19.94] 11/27/2016  . Cocaine use disorder, severe, dependence ([place]) [F14.20] 11/27/2016  . Scrotal abscess [N49.2] 12/23/2014  . HTN (hypertension) [I10] 12/22/2014  . GERD (gastroesophageal reflux disease) [K21.9] 12/22/2014  . Tobacco use disorder [F17.200] 12/22/2014  . 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 Cannabis use disorder, severe, dependence ([place]) [F12.20] 12/22/2014  . Alcohol use disorder, mild, abuse [F10.10] 12/22/2014  . Major depressive disorder, recurrent episode, moderate with anxious distress ([place]) [F33.1] 12/22/2014  . Antisocial personality disorder [F60.2] 12/22/2014  . Hepatitis C (treated) [B19.20] 09/01/2013    Total Time spent with patient: 45 minutes  Musculoskeletal: Strength & Muscle Tone: within normal limits Gait & Station: normal Patient leans: N/A  Psychiatric Specialty Exam: Review of Systems  Constitutional: Negative.   HENT: Negative.   Eyes: Negative.   Respiratory: Negative.   Cardiovascular: Negative.   Gastrointestinal: Negative.   Genitourinary: Negative.   Musculoskeletal: Negative.   Skin: Negative.   Neurological: Negative.   Endo/Heme/Allergies: Negative.   Psychiatric/Behavioral: Negative for depression, hallucinations, memory loss and suicidal ideas. The patient is not nervous/anxious and does not have insomnia.     Blood pressure 130/85, pulse 89, temperature 98.3 F (36.8 C), temperature source Oral, resp. rate 20, height 5\' 9"  (1.753 m), weight 77.1 kg (170 lb), SpO2 99 %.Body mass index is 25.1 kg/m.  General Appearance: Neatly dressed, pleasant, engaging well and cooperative. Appropriate behavior. Not in any distress. Good relatedness. Not internally stimulated  Eye Contact::  Good  Speech:  Spontaneous, normal prosody. Normal tone and rate.   Volume:  Normal  Mood:  Euthymic  Affect:  Appropriate and Full Range  Thought Process:  Goal Directed and Linear  Orientation:  Full (Time, Place, and Person)  Thought Content:  Future oriented. No delusional theme. No preoccupation with violent thoughts. No negative ruminations. No obsession.  No hallucination in any modality.   Suicidal Thoughts:  No  Homicidal Thoughts:  No  Memory:  Immediate;   Good Recent;   Good Remote;   Good  Judgement:  Good  Insight:  Good  Psychomotor Activity:  Normal  Concentration:  Good  Recall:  Good  Fund of Knowledge:Good  Language: Good  Akathisia:  Negative  Handed:    AIMS (if indicated):     Assets:  Communication Skills Desire for Improvement Financial Resources/Insurance Housing Intimacy Physical Health Resilience Social Support  Sleep:  Number of Hours: 2.25  Cognition: WNL  ADL's:  Intact   Clinical Assessment::   51 yo AAM, single, on SSI,  lives with his payee. Says he is a registered sex offender. Background history of SUD. Self presented to seek help. UDS was positive for cocaine and THC. Patient reports being tired of his lifestyle. He is using cocaine, THC and alcohol daily. Says he gets paranoid as he thinks his payee is trying to poison him. He has transient hallucinations in visual and auditory modality. Says he gets destructive and agitated under the influence.   Seen today. Pleased he has worked things out with is payee. Reports that he is in good spirits. Not feeling depressed. Reports normal energy and interest. Has been maintaining normal biological functions. Sleep broken last night as he was looking forward to discharge.  He is able to think clearly. He is able to focus  on task. His thoughts are not crowded or racing. No evidence of mania. No hallucination in any modality. He is not making any delusional statement. No passivity of will/thought. He is fully in touch  with reality. No thoughts of suicide. No thoughts of homicide. No violent thoughts. No overwhelming anxiety. No craving for substances. No access to weapons.   Nursing staff reports that patient has been appropriate on the unit. Patient has been interacting well with peers. No behavioral issues. Patient has not voiced any suicidal thoughts. Patient has not been observed to be internally stimulated. Patient has been adherent with treatment recommendations. Patient has been tolerating their medication well.   Patient was discussed at team. Team members feels that patient is back to his baseline level of function. Team agrees with plan to discharge patient today.  Demographic Factors:  Male and Unemployed  Loss Factors: Legal issues  Historical Factors: Impulsivity  Risk Reduction Factors:   Living with another person, especially a relative, Positive social support, Positive therapeutic relationship and Positive coping skills or problem solving skills  Continued Clinical Symptoms:   As above  Cognitive Features That Contribute To Risk:  None    Suicide Risk:  Minimal: No identifiable suicidal ideation.  Patient is not having any thoughts of suicide at this time. Modifiable risk factors targeted during this admission includes  substance use and associated mood/psychotic symptoms. Demographical and historical risk factors cannot be modified. Patient is now engaging well. Patient is reliable and is future oriented. We have buffered patient's support structures. At this point, patient is at low risk of suicide. Patient is aware of the effects of psychoactive substances on decision making process. Patient has been provided with emergency contacts. Patient acknowledges to use resources provided if unforseen circumstances changes their current risk stratification.    Follow-up Information    [facility] Follow up on 12/02/2016.   Specialty:  Behavioral Health Why:  Patient accepted Transitional Care  Team services, will begin on day of discharge.  Contact information: [place] [address] [phone]        Services, [facility] Follow up on 12/03/2016.   Why:  If you are still interested, screening for possible admission on Tuesday at 7:45AM. Please bring: medication supply/prescriptions provided by hospital; photo ID/proof of [employer] residence, and Games developer. Thank you.  Contact information: [place] [address] [phone]           Plan Of Care/Follow-up recommendations:  1. Continue current psychotropic medications 2. Mental health and addiction follow up as arranged.  3. Discharge in care of his payee. 4. Provided limited quantity of prescriptions   Artist Beach, MD 12/02/2016, 10:13 AM
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
## 2016-12-02 NOTE — Progress Notes (Signed)
D: Patient observed resting in bed but promptly came up for meds. Patient verbalizes to this Probation officer he is feeling anxious and asks for a vistaril prn. Patient's affect appropriate, mood anxious but pleasant. Patient states his depression is improved as is his sleep. Denies pain, physical problems.   A: Medicated per orders, prn vistaril given for anxiety. Level III obs in place for safety. Emotional support offered and self inventory reviewed. Encouraged completion of Suicide Safety Plan and programming participation. Discussed POC with MD, SW.  R: Patient verbalizes understanding of POC. On reassess, patient reports decrease in anxiety. Patient denies SI/HI/AVH and remains safe on level III obs. Will continue to monitor closely and make verbal contact frequently. Per MD, patient is discharging today.

## 2016-12-02 NOTE — Progress Notes (Signed)
  [facility] Adult Case Management Discharge Plan :  Will you be returning to the same living situation after discharge:  Yes,  home with payee/girlfriend At discharge, do you have transportation home?: Yes,  girlfriend Do you have the ability to pay for your medications: Yes,  [place] med[place]  Release of information consent forms completed and submitted to medical records by CSW.  Patient to Follow up at: Follow-up Information    [facility] Follow up.   Specialty:  Behavioral Health Why:  Patient accepted Transitional Care Team services, will begin on day of discharge.  Contact information: [place] [address] [phone]        [facility] Follow up on 12/03/2016.   Why:  If you are still interested, screening for possible admission on Tuesday at 7:45AM. Please bring: medication supply/prescriptions provided by hospital; photo ID/proof of [employer] residence, and Games developer. Thank you.  Contact information: [place] [address] [phone]           Next level of care provider has access to [place] and Suicide Prevention discussed: Yes,  SPE completed with pt's girlfriend  Have you used any form of tobacco in the last 30 days? (Cigarettes, Smokeless Tobacco, Cigars, and/or Pipes): Yes  Has patient been referred to the Quitline?: Patient refused referral  Patient has been referred for addiction treatment: Yes  [employer], LCSW 12/02/2016, 9:26 AM

## 2016-12-02 NOTE — Progress Notes (Signed)
Patient verbalizes readiness for discharge. Follow up plan explained, AVS, transition record and SRA given along with prescriptions. All belongings returned. Patient verbalizes understanding. Denies SI/HI and assures this Probation officer he will seek assistance should that change. Patient discharged ambulatory and in stable condition with bus card (patient has his own). Patient plans to take the bus to his home, return, and gather his belongings.

## 2016-12-02 NOTE — Progress Notes (Signed)
Pt reports having a good day.  He denies any withdrawal symptoms at this time.  He denies SI/HI/AVH with Probation officer at this time.  He voices no needs or concerns.  He hopes to discharge soon.  Pt makes his needs known to staff.  He has been pleasant and cooperative with staff.  Support and encouragement offered.  Discharge plans are in process.  Safety maintained with q15 minute checks.

## 2016-12-13 ENCOUNTER — Encounter (HOSPITAL_COMMUNITY): Payer: Self-pay | Admitting: Emergency Medicine

## 2016-12-13 ENCOUNTER — Emergency Department (HOSPITAL_COMMUNITY)
Admission: EM | Admit: 2016-12-13 | Discharge: 2016-12-14 | Disposition: A | Discharge status: Home / Self Care | Payer: Medicaid Other | Attending: Emergency Medicine | Admitting: Emergency Medicine

## 2016-12-13 DIAGNOSIS — F1910 Other psychoactive substance abuse, uncomplicated: Secondary | ICD-10-CM | POA: Diagnosis not present

## 2016-12-13 DIAGNOSIS — F1994 Other psychoactive substance use, unspecified with psychoactive substance-induced mood disorder: Secondary | ICD-10-CM | POA: Diagnosis not present

## 2016-12-13 DIAGNOSIS — R45851 Suicidal ideations: Secondary | ICD-10-CM | POA: Diagnosis not present

## 2016-12-13 DIAGNOSIS — F17210 Nicotine dependence, cigarettes, uncomplicated: Secondary | ICD-10-CM | POA: Diagnosis not present

## 2016-12-13 DIAGNOSIS — F1010 Alcohol abuse, uncomplicated: Secondary | ICD-10-CM | POA: Insufficient documentation

## 2016-12-13 DIAGNOSIS — F259 Schizoaffective disorder, unspecified: Secondary | ICD-10-CM | POA: Diagnosis present

## 2016-12-13 DIAGNOSIS — F1420 Cocaine dependence, uncomplicated: Secondary | ICD-10-CM | POA: Diagnosis not present

## 2016-12-13 DIAGNOSIS — Z630 Problems in relationship with spouse or partner: Secondary | ICD-10-CM | POA: Diagnosis not present

## 2016-12-13 DIAGNOSIS — I10 Essential (primary) hypertension: Secondary | ICD-10-CM | POA: Diagnosis not present

## 2016-12-13 DIAGNOSIS — F331 Major depressive disorder, recurrent, moderate: Secondary | ICD-10-CM | POA: Diagnosis present

## 2016-12-13 DIAGNOSIS — F329 Major depressive disorder, single episode, unspecified: Secondary | ICD-10-CM | POA: Diagnosis present

## 2016-12-13 DIAGNOSIS — F332 Major depressive disorder, recurrent severe without psychotic features: Secondary | ICD-10-CM | POA: Diagnosis not present

## 2016-12-13 LAB — COMPREHENSIVE METABOLIC PANEL
ALBUMIN: 4.4 g/dL (ref 3.5–5.0)
ALT: 26 U/L (ref 17–63)
ANION GAP: 10 (ref 5–15)
AST: 39 U/L (ref 15–41)
Alkaline Phosphatase: 74 U/L (ref 38–126)
BILIRUBIN TOTAL: 0.6 mg/dL (ref 0.3–1.2)
BUN: 13 mg/dL (ref 6–20)
CALCIUM: 9.4 mg/dL (ref 8.9–10.3)
CHLORIDE: 106 mmol/L (ref 101–111)
CO2: 23 mmol/L (ref 22–32)
Creatinine, Ser: 1.23 mg/dL (ref 0.61–1.24)
GFR calc Af Amer: >60 mL/min (ref >60)
GFR calc non Af Amer: >60 mL/min (ref >60)
GLUCOSE: 75 mg/dL (ref 65–99)
POTASSIUM: 3.6 mmol/L (ref 3.5–5.1)
SODIUM: 139 mmol/L (ref 135–145)
TOTAL PROTEIN: 7.9 g/dL (ref 6.5–8.1)

## 2016-12-13 LAB — SALICYLATE LEVEL: Salicylate Lvl: <7 mg/dL (ref 2.8–30.0)

## 2016-12-13 LAB — CBC
HEMATOCRIT: 40.6 % (ref 39.0–52.0)
Hemoglobin: 13.8 g/dL (ref 13.0–17.0)
MCH: 27.5 pg (ref 26.0–34.0)
MCHC: 34 g/dL (ref 30.0–36.0)
MCV: 81 fL (ref 78.0–100.0)
Platelets: 237 10*3/uL (ref 150–400)
RBC: 5.01 MIL/uL (ref 4.22–5.81)
RDW: 13.1 % (ref 11.5–15.5)
WBC: 8.9 10*3/uL (ref 4.0–10.5)

## 2016-12-13 LAB — ACETAMINOPHEN LEVEL

## 2016-12-13 LAB — RAPID URINE DRUG SCREEN, HOSP PERFORMED
Amphetamines: NOT DETECTED
BARBITURATES: NOT DETECTED
Benzodiazepines: POSITIVE — AB
COCAINE: POSITIVE — AB
Opiates: NOT DETECTED
Tetrahydrocannabinol: POSITIVE — AB

## 2016-12-13 LAB — ETHANOL: Alcohol, Ethyl (B): 13 mg/dL — ABNORMAL HIGH (ref <5)

## 2016-12-13 NOTE — Progress Notes (Signed)
Lindon Romp, NP recommends inpt treatment. TTS attempted to notify triage nurse [phone] and EDP, routed to nurse secretary instead. Providers unavailable at this time.   Lind Covert, MSW, [place] TTS Specialist [phone]

## 2016-12-13 NOTE — ED Notes (Signed)
Bed: IFX25 Expected date:  Expected time:  Means of arrival:  Comments: [place]
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
## 2016-12-13 NOTE — ED Notes (Signed)
Pt urinated in sink upon arrival, pt stated he didn't want to walk to restroom
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
## 2016-12-13 NOTE — ED Provider Notes (Signed)
[place] DEPT Provider Note   CSN: [id number] Arrival date & time: 12/13/16  1811     History   Chief Complaint Chief Complaint  Patient presents with  . Suicidal  . Drug Problem  . Alcohol Problem    HPI Ethan [name] is a 51 y.o. male.  Patient with hx depression, substance abuse, c/o increased depression in past week, related to a bad relationship.  He indicates he is having thoughts of suicide with plan to overdose on medication. Also has been drinking more alcohol and smoking crack cocaine. Denies acute physical illness or symptoms. No headache. No chest pain or sob. No abd pain. Normal appetite. No nvd. Denies fall or injury.    The history is provided by the patient.  Drug Problem  Pertinent negatives include no chest pain, no abdominal pain, no headaches and no shortness of breath.  Alcohol Problem  Pertinent negatives include no chest pain, no abdominal pain, no headaches and no shortness of breath.    Past Medical History:  Diagnosis Date  . Alcoholism ([place])   . Anxiety   . Depression   . GERD (gastroesophageal reflux disease)   . Heart murmur   . Hepatitis C    Treated in prison for 18 months.  Sounds like Ribaviron and Interferon  2009-2011  . Hypertension   . Peptic ulcer   . Substance abuse   . Urge urinary incontinence    07/20/2015:  evaluated by Dr. Kathie Rhodes, Urodynamics planned    Patient Active Problem List   Diagnosis Date Noted  . Substance induced mood disorder ([place]) 11/27/2016  . Cocaine use disorder, severe, dependence ([place]) 11/27/2016  . Scrotal abscess 12/23/2014  . HTN (hypertension) 12/22/2014  . GERD (gastroesophageal reflux disease) 12/22/2014  . Tobacco use disorder 12/22/2014  . Stimulant use disorder ([place]) 12/22/2014  . Cannabis use disorder, severe, dependence ([place]) 12/22/2014  . Alcohol use disorder, mild, abuse 12/22/2014  . Major depressive disorder, recurrent episode, moderate with anxious distress ([place])  12/22/2014  . Antisocial personality disorder 12/22/2014  . Hepatitis C (treated) 09/01/2013    Past Surgical History:  Procedure Laterality Date  . APPENDECTOMY    . INGUINAL HERNIA REPAIR Bilateral        Home Medications    Prior to Admission medications   Medication Sig Start Date End Date Taking? Authorizing Provider  hydrOXYzine (ATARAX/VISTARIL) 25 MG tablet Take 1 tablet (25 mg total) by mouth every 6 (six) hours as needed for anxiety. 12/02/16   Derrill Center, NP  nicotine (NICODERM CQ - DOSED IN MG/24 HOURS) 21 mg/24hr patch Place 1 patch (21 mg total) onto the skin daily. 12/03/16   Derrill Center, NP  pantoprazole (PROTONIX) 40 MG tablet Take 1 tablet (40 mg total) by mouth daily. 12/03/16   Derrill Center, NP  risperiDONE (RISPERDAL) 0.5 MG tablet Take 1 tablet (0.5 mg total) by mouth 2 (two) times daily. 12/02/16   Derrill Center, NP  traZODone (DESYREL) 50 MG tablet Take 1 tablet (50 mg total) by mouth at bedtime and may repeat dose one time if needed. 12/02/16   Derrill Center, NP    Family History Family History  Problem Relation Age of Onset  . Breast cancer Sister   . Breast cancer Maternal Grandmother   . Colon polyps Brother   . Diabetes Sister        x 2  . Diabetes Maternal Aunt   . Diabetes Paternal Aunt   .  Diabetes Mother        DM complications cause of death  . Alcohol abuse Father   . COPD Neg Hx     Social History Social History  Substance Use Topics  . Smoking status: Current Every Day Smoker    Packs/day: 0.50    Years: 12.00    Types: Cigarettes    Start date: 07/14/1976  . Smokeless tobacco: Never Used     Comment: Stopped for 16 years.  Restarted with bout of depression/anxiety recently  . Alcohol use 0.0 oz/week     Comment: 3 beers nightly     Allergies   Patient has no known allergies.   Review of Systems Review of Systems  Constitutional: Negative for fever.  HENT: Negative for sore throat.   Eyes: Negative for  redness.  Respiratory: Negative for shortness of breath.   Cardiovascular: Negative for chest pain.  Gastrointestinal: Negative for abdominal pain.  Genitourinary: Negative for flank pain.  Musculoskeletal: Negative for back pain and neck pain.  Skin: Negative for rash.  Neurological: Negative for headaches.  Hematological: Does not bruise/bleed easily.  Psychiatric/Behavioral: Positive for dysphoric mood and suicidal ideas.     Physical Exam Updated Vital Signs BP (!) 163/112 (BP Location: Left Arm)   Pulse 82   Temp 98.5 F (36.9 C) (Oral)   Resp 16   SpO2 94%   Physical Exam  Constitutional: He appears well-developed and well-nourished. No distress.  HENT:  Head: Atraumatic.  Mouth/Throat: Oropharynx is clear and moist.  Eyes: Pupils are equal, round, and reactive to light. Conjunctivae are normal.  Neck: Neck supple. No tracheal deviation present.  Cardiovascular: Normal rate, regular rhythm, normal heart sounds and intact distal pulses.  Exam reveals no gallop and no friction rub.   No murmur heard. Pulmonary/Chest: Effort normal and breath sounds normal. No accessory muscle usage. No respiratory distress.  Abdominal: Soft. He exhibits no distension. There is no tenderness.  Musculoskeletal: He exhibits no edema or tenderness.  Neurological: He is alert.  Speech normal. Ambulates w steady gait. No tremor or shakes.   Skin: Skin is warm and dry. He is not diaphoretic.  Psychiatric:  Depressed mood.   Nursing note and vitals reviewed.    ED Treatments / Results  Labs (all labs ordered are listed, but only abnormal results are displayed) Results for orders placed or performed during the hospital encounter of 12/13/16  Comprehensive metabolic panel  Result Value Ref Range   Sodium 139 135 - 145 mmol/L   Potassium 3.6 3.5 - 5.1 mmol/L   Chloride 106 101 - 111 mmol/L   CO2 23 22 - 32 mmol/L   Glucose, Bld 75 65 - 99 mg/dL   BUN 13 6 - 20 mg/dL   Creatinine, Ser  1.23 0.61 - 1.24 mg/dL   Calcium 9.4 8.9 - 10.3 mg/dL   Total Protein 7.9 6.5 - 8.1 g/dL   Albumin 4.4 3.5 - 5.0 g/dL   AST 39 15 - 41 U/L   ALT 26 17 - 63 U/L   Alkaline Phosphatase 74 38 - 126 U/L   Total Bilirubin 0.6 0.3 - 1.2 mg/dL   GFR calc non Af Amer >60 >60 mL/min   GFR calc Af Amer >60 >60 mL/min   Anion gap 10 5 - 15  Ethanol  Result Value Ref Range   Alcohol, Ethyl (B) 13 (H) <5 mg/dL  Salicylate level  Result Value Ref Range   Salicylate Lvl <0.9 2.8 - 30.0  mg/dL  Acetaminophen level  Result Value Ref Range   Acetaminophen (Tylenol), Serum <10 (L) 10 - 30 ug/mL  cbc  Result Value Ref Range   WBC 8.9 4.0 - 10.5 K/uL   RBC 5.01 4.22 - 5.81 MIL/uL   Hemoglobin 13.8 13.0 - 17.0 g/dL   HCT 40.6 39.0 - 52.0 %   MCV 81.0 78.0 - 100.0 fL   MCH 27.5 26.0 - 34.0 pg   MCHC 34.0 30.0 - 36.0 g/dL   RDW 13.1 11.5 - 15.5 %   Platelets 237 150 - 400 K/uL   EKG  EKG Interpretation  Date/Time:  Friday December 13 2016 19:33:10 EDT Ventricular Rate:  77 PR Interval:    QRS Duration: 82 QT Interval:  380 QTC Calculation: 430 R Axis:   47 Text Interpretation:  Sinus rhythm Borderline prolonged PR interval Confirmed by Lajean Saver [phone]) on 12/13/2016 7:44:53 PM       Radiology No results found.  Procedures Procedures (including critical care time)  Medications Ordered in ED Medications - No data to display   Initial Impression / Assessment and Plan / ED Course  I have reviewed the triage vital signs and the nursing notes.  Pertinent labs & imaging results that were available during my care of the patient were reviewed by me and considered in my medical decision making (see chart for details).  Labs sent.  Reviewed nursing notes and prior charts for additional history.   Behavioral health team consulted.  Po fluids.  Recheck, calm and alert.  Disposition per [facility] team.   Final Clinical Impressions(s) / ED Diagnoses   Final diagnoses:  None    New  Prescriptions New Prescriptions   No medications on file     Lajean Saver, MD 12/13/16 [phone]

## 2016-12-13 NOTE — ED Notes (Signed)
Bed: [facility] Expected date:  Expected time:  Means of arrival:  Comments: 

## 2016-12-14 DIAGNOSIS — F17210 Nicotine dependence, cigarettes, uncomplicated: Secondary | ICD-10-CM

## 2016-12-14 DIAGNOSIS — R450 Nervousness: Secondary | ICD-10-CM | POA: Diagnosis not present

## 2016-12-14 DIAGNOSIS — R45851 Suicidal ideations: Secondary | ICD-10-CM | POA: Diagnosis not present

## 2016-12-14 DIAGNOSIS — Z811 Family history of alcohol abuse and dependence: Secondary | ICD-10-CM | POA: Diagnosis not present

## 2016-12-14 DIAGNOSIS — F419 Anxiety disorder, unspecified: Secondary | ICD-10-CM | POA: Diagnosis not present

## 2016-12-14 DIAGNOSIS — F1994 Other psychoactive substance use, unspecified with psychoactive substance-induced mood disorder: Secondary | ICD-10-CM

## 2016-12-14 DIAGNOSIS — F332 Major depressive disorder, recurrent severe without psychotic features: Secondary | ICD-10-CM | POA: Diagnosis not present

## 2016-12-14 DIAGNOSIS — Z630 Problems in relationship with spouse or partner: Secondary | ICD-10-CM | POA: Diagnosis not present

## 2016-12-14 DIAGNOSIS — R4587 Impulsiveness: Secondary | ICD-10-CM | POA: Diagnosis not present

## 2016-12-14 DIAGNOSIS — F1420 Cocaine dependence, uncomplicated: Secondary | ICD-10-CM

## 2016-12-14 MED ORDER — HYDROXYZINE HCL 25 MG PO TABS: 25.0000 mg | ORAL_TABLET | Freq: Three times a day (TID) | ORAL | Status: DC | PRN

## 2016-12-14 MED ORDER — HYDROXYZINE HCL 25 MG PO TABS
ORAL_TABLET | ORAL | Status: AC
  Administered 2016-12-14: 14:00:00
  Filled 2016-12-14: qty 1

## 2016-12-14 NOTE — Discharge Instructions (Signed)
For your ongoing mental health needs, you are advised to follow up with [facility].  New and returning patients are seen at their walk-in clinic.  Walk-in hours are Monday - Friday from 8:00 am - 3:00 pm.  Walk-in patients are seen on a first come, first served basis.  Try to arrive as early as possible for he best chance of being seen the same day: ° °     [facility] °     [address] °     , [place] [address] °     [phone] °

## 2016-12-14 NOTE — Progress Notes (Signed)
CSW informed by patient's RN that patient is interested in housing resources and noted that patient reports that he is on the sex offender registry. CSW provided patient with local shelter listing and informed patient that he would need to call to see if they accept sex offenders, CSW informed patient that CSW was not aware of any shelters that took sex offenders. Patient reported that no one helps sex offenders. CSW inquired about patient's family/friends, patient reported never mind and ended conversation with CSW. Patient declined to speak with CSW any further, CSW updated patient's RN.   Abundio Miu, [facility]  Clinical Social Worker [phone]

## 2016-12-14 NOTE — ED Notes (Signed)
Denies SI/HI.  All belongings returned and escorted to lobby with personal transportation arrangements.

## 2016-12-14 NOTE — BHH Suicide Risk Assessment (Signed)
Suicide Risk Assessment  Discharge Assessment   [facility] Discharge Suicide Risk Assessment   Principal Problem: Major depressive disorder, recurrent episode, moderate with anxious distress [facility]) Discharge Diagnoses:  Patient Active Problem List   Diagnosis Date Noted  . Substance induced mood disorder ([place]) [F19.94] 11/27/2016    Priority: High  . Major depressive disorder, recurrent episode, moderate with anxious distress ([place]) [F33.1] 12/22/2014    Priority: High  . Cocaine use disorder, severe, dependence ([place]) [F14.20] 11/27/2016  . Scrotal abscess [N49.2] 12/23/2014  . HTN (hypertension) [I10] 12/22/2014  . GERD (gastroesophageal reflux disease) [K21.9] 12/22/2014  . Tobacco use disorder [F17.200] 12/22/2014  . 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 Cannabis use disorder, severe, dependence ([place]) [F12.20] 12/22/2014  . Alcohol use disorder, mild, abuse [F10.10] 12/22/2014  . Antisocial personality disorder [F60.2] 12/22/2014  . Hepatitis C (treated) [B19.20] 09/01/2013    Total Time spent with patient: 45 minutes  Musculoskeletal: Strength & Muscle Tone: within normal limits Gait & Station: normal Patient leans: N/A  Psychiatric Specialty Exam: Physical Exam  Constitutional: He is oriented to person, place, and time. He appears well-developed and well-nourished.  Respiratory: Effort normal.  Musculoskeletal: Normal range of motion.  Neurological: He is alert and oriented to person, place, and time.  Psychiatric: His speech is normal and behavior is normal. Thought content normal. His mood appears anxious. Cognition and memory are normal. He expresses impulsivity.   Review of Systems  Psychiatric/Behavioral: Positive for depression, substance abuse and suicidal ideas (passive). Negative for hallucinations and memory loss. The patient is nervous/anxious. The patient does not have insomnia.    Blood pressure (!) 143/89, pulse 63, temperature 98 F (36.7 C),  temperature source Oral, resp. rate 18, height 5\' 9"  (1.753 m), SpO2 99 %.There is no height or weight on file to calculate BMI. General Appearance: Casual Eye Contact:  Good Speech:  Clear and Coherent and Normal Rate Volume:  Normal Mood:  Anxious and Depressed Affect:  Congruent and Depressed Thought Process:  Coherent and Linear Orientation:  Full (Time, Place, and Person) Thought Content:  Logical Suicidal Thoughts:  No Homicidal Thoughts:  No Memory:  Immediate;   Good Recent;   Good Remote;   Fair Judgement:  Fair Insight:  Fair Psychomotor Activity:  Normal Concentration:  Concentration: Good and Attention Span: Good Recall:  Good Fund of Knowledge:  Good Language:  Good Akathisia:  No Handed:  Right AIMS (if indicated):    Assets:  Agricultural consultant Housing Resilience Social Support ADL's:  Intact Cognition:  WNL  Mental Status Per Nursing Assessment::   On Admission:   agitated and intoxicated  Demographic Factors:  Male, Low socioeconomic status and Unemployed  Loss Factors: Financial problems/change in socioeconomic status  Historical Factors: Impulsivity  Risk Reduction Factors:   Sense of responsibility to family and Living with another person, especially a relative  Continued Clinical Symptoms:  Depression:   Impulsivity Alcohol/Substance Abuse/Dependencies More than one psychiatric diagnosis  Cognitive Features That Contribute To Risk:  Closed-mindedness    Suicide Risk:  Minimal: No identifiable suicidal ideation.  Patients presenting with no risk factors but with morbid ruminations; may be classified as minimal risk based on the severity of the depressive symptoms    Plan Of Care/Follow-up recommendations:  Activity:  as tolerated Diet:  Heart Healthy  Ethelene Hal, NP 12/14/2016, 1:22 PM

## 2016-12-14 NOTE — Consult Note (Signed)
[facility] Consult   Reason for Consult:  Suicidal ideation Referring Physician:  EDP Patient Identification: LEMAN [name] MRN:  [id number] Principal Diagnosis: Major depressive disorder, recurrent episode, moderate with anxious distress [employer]) Diagnosis:   Patient Active Problem List   Diagnosis Date Noted  . Substance induced mood disorder ([place]) [F19.94] 11/27/2016    Priority: High  . Major depressive disorder, recurrent episode, moderate with anxious distress ([place]) [F33.1] 12/22/2014    Priority: High  . Cocaine use disorder, severe, dependence ([place]) [F14.20] 11/27/2016  . Scrotal abscess [N49.2] 12/23/2014  . HTN (hypertension) [I10] 12/22/2014  . GERD (gastroesophageal reflux disease) [K21.9] 12/22/2014  . Tobacco use disorder [F17.200] 12/22/2014  . 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 Cannabis use disorder, severe, dependence ([place]) [F12.20] 12/22/2014  . Alcohol use disorder, mild, abuse [F10.10] 12/22/2014  . Antisocial personality disorder [F60.2] 12/22/2014  . Hepatitis C (treated) [B19.20] 09/01/2013    Total Time spent with patient: 45 minutes  Subjective:   Ethan [name] is a 51 y.o. male patient admitted with suicidal ideation.  HPI:  Pt was seen and chart reviewed with treatment team. Pt was admitted after arguing with his fiance' and using $300 of crack cocaine. Pt stated he made suicidal statements because because his fiance', who is also his payee, is cheating on him.  Pt denies suicidal/homicidal ideation, denies auditory/visual hallucinations and does not appear to be responding to internal stimuli. Pt was calm and cooperative, alert & oriented x 4, and appropriate for the situation. Pt is psychiatrically cleared for discharge.   Past Psychiatric History: Alcohol use disorder, Cannabis use disorder, MDD, Cocaine use disorder  Risk to Self: None Risk to Others: None Prior Inpatient Therapy: Prior Inpatient Therapy:  Yes Prior Therapy Dates: 2018 and mult other admits  Prior Therapy Facilty/Provider(s): [facility], [facility] Reason for Treatment: MDD, SA, ALCOHOL, COCAINE  Prior Outpatient Therapy: Prior Outpatient Therapy: Yes Prior Therapy Dates: CURRENT Prior Therapy Facilty/Provider(s): [facility] Reason for Treatment: MED MANAGEMENT  Does patient have an ACCT team?: No Does patient have Intensive In-House Services?  : No Does patient have [facility] services? : Yes Does patient have P4CC services?: No  Past Medical History:  Past Medical History:  Diagnosis Date  . Alcoholism ([place])   . Anxiety   . Depression   . GERD (gastroesophageal reflux disease)   . Heart murmur   . Hepatitis C    Treated in prison for 18 months.  Sounds like Ribaviron and Interferon  2009-2011  . Hypertension   . Peptic ulcer   . Substance abuse   . Urge urinary incontinence    07/20/2015:  evaluated by Dr. Kathie Rhodes, Urodynamics planned    Past Surgical History:  Procedure Laterality Date  . APPENDECTOMY    . INGUINAL HERNIA REPAIR Bilateral    Family History:  Family History  Problem Relation Age of Onset  . Breast cancer Sister   . Breast cancer Maternal Grandmother   . Colon polyps Brother   . Diabetes Sister        x 2  . Diabetes Maternal Aunt   . Diabetes Paternal Aunt   . Diabetes Mother        DM complications cause of death  . Alcohol abuse Father   . COPD Neg Hx    Family Psychiatric  History: Unknown Social History:  History  Alcohol Use  . 0.0 oz/week    Comment: 3 beers nightly     History  Drug Use  . Types: "Crack" cocaine    Comment: Occassionally MJ currently--in past crack cocaine--more than 16 years ago.    Social History   Social History  . Marital status: Significant Other    Spouse name: N/A  . Number of children: 3  . Years of education: GED   Occupational History  . 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 [employer]   Social History Main Topics  . Smoking status:  Current Every Day Smoker    Packs/day: 0.50    Years: 12.00    Types: Cigarettes    Start date: 07/14/1976  . Smokeless tobacco: Never Used     Comment: Stopped for 16 years.  Restarted with bout of depression/anxiety recently  . Alcohol use 0.0 oz/week     Comment: 3 beers nightly  . Drug use: Yes    Types: "Crack" cocaine     Comment: Occassionally MJ currently--in past crack cocaine--more than 16 years ago.  [name] Sexual activity: Yes    Partners: Female    Birth control/ protection: None     Comment: WIFE USED MAJUANA SATURDAY   Other Topics Concern  . None   Social History Narrative   Lost job and schooling secondary to "past"   Apparently for what he was incarcerated for.   Additional Social History:    Allergies:  No Known Allergies  Labs:  Results for orders placed or performed during the hospital encounter of 12/13/16 (from the past 48 hour(s))  Rapid urine drug screen (hospital performed)     Status: Abnormal   Collection Time: 12/13/16  6:25 PM  Result Value Ref Range   Opiates NONE DETECTED NONE DETECTED   Cocaine POSITIVE (A) NONE DETECTED   Benzodiazepines POSITIVE (A) NONE DETECTED   Amphetamines NONE DETECTED NONE DETECTED   Tetrahydrocannabinol POSITIVE (A) NONE DETECTED   Barbiturates NONE DETECTED NONE DETECTED    Comment:        DRUG SCREEN FOR MEDICAL PURPOSES ONLY.  IF CONFIRMATION IS NEEDED FOR ANY PURPOSE, NOTIFY LAB WITHIN 5 DAYS.        LOWEST DETECTABLE LIMITS FOR URINE DRUG SCREEN Drug Class       Cutoff (ng/mL) Amphetamine      1000 Barbiturate      200 Benzodiazepine   578 Tricyclics       469 Opiates          300 Cocaine          300 THC              50   Comprehensive metabolic panel     Status: None   Collection Time: 12/13/16  6:38 PM  Result Value Ref Range   Sodium 139 135 - 145 mmol/L   Potassium 3.6 3.5 - 5.1 mmol/L   Chloride 106 101 - 111 mmol/L   CO2 23 22 - 32 mmol/L   Glucose, Bld 75 65 - 99 mg/dL   BUN 13 6 - 20  mg/dL   Creatinine, Ser 1.23 0.61 - 1.24 mg/dL   Calcium 9.4 8.9 - 10.3 mg/dL   Total Protein 7.9 6.5 - 8.1 g/dL   Albumin 4.4 3.5 - 5.0 g/dL   AST 39 15 - 41 U/L   ALT 26 17 - 63 U/L   Alkaline Phosphatase 74 38 - 126 U/L   Total Bilirubin 0.6 0.3 - 1.2 mg/dL   GFR calc non Af Amer >60 >60 mL/min   GFR calc  Af Amer >60 >60 mL/min    Comment: (NOTE) The eGFR has been calculated using the CKD EPI equation. This calculation has not been validated in all clinical situations. eGFR's persistently <60 mL/min signify possible Chronic Kidney Disease.    Anion gap 10 5 - 15  Ethanol     Status: Abnormal   Collection Time: 12/13/16  6:38 PM  Result Value Ref Range   Alcohol, Ethyl (B) 13 (H) <5 mg/dL    Comment:        LOWEST DETECTABLE LIMIT FOR SERUM ALCOHOL IS 5 mg/dL FOR MEDICAL PURPOSES ONLY   Salicylate level     Status: None   Collection Time: 12/13/16  6:38 PM  Result Value Ref Range   Salicylate Lvl <1.6 2.8 - 30.0 mg/dL  Acetaminophen level     Status: Abnormal   Collection Time: 12/13/16  6:38 PM  Result Value Ref Range   Acetaminophen (Tylenol), Serum <10 (L) 10 - 30 ug/mL    Comment:        THERAPEUTIC CONCENTRATIONS VARY SIGNIFICANTLY. A RANGE OF 10-30 ug/mL MAY BE AN EFFECTIVE CONCENTRATION FOR MANY PATIENTS. HOWEVER, SOME ARE BEST TREATED AT CONCENTRATIONS OUTSIDE THIS RANGE. ACETAMINOPHEN CONCENTRATIONS >150 ug/mL AT 4 HOURS AFTER INGESTION AND >50 ug/mL AT 12 HOURS AFTER INGESTION ARE OFTEN ASSOCIATED WITH TOXIC REACTIONS.   cbc     Status: None   Collection Time: 12/13/16  6:38 PM  Result Value Ref Range   WBC 8.9 4.0 - 10.5 K/uL   RBC 5.01 4.22 - 5.81 MIL/uL   Hemoglobin 13.8 13.0 - 17.0 g/dL   HCT 40.6 39.0 - 52.0 %   MCV 81.0 78.0 - 100.0 fL   MCH 27.5 26.0 - 34.0 pg   MCHC 34.0 30.0 - 36.0 g/dL   RDW 13.1 11.5 - 15.5 %   Platelets 237 150 - 400 K/uL    No current facility-administered medications for this encounter.    Current Outpatient  Prescriptions  Medication Sig Dispense Refill  . hydrOXYzine (ATARAX/VISTARIL) 25 MG tablet Take 1 tablet (25 mg total) by mouth every 6 (six) hours as needed for anxiety. 30 tablet 0  . pantoprazole (PROTONIX) 40 MG tablet Take 1 tablet (40 mg total) by mouth daily. 30 tablet 0  . risperiDONE (RISPERDAL) 0.5 MG tablet Take 1 tablet (0.5 mg total) by mouth 2 (two) times daily. 60 tablet 0  . traZODone (DESYREL) 50 MG tablet Take 1 tablet (50 mg total) by mouth at bedtime and may repeat dose one time if needed. 30 tablet 0  . nicotine (NICODERM CQ - DOSED IN MG/24 HOURS) 21 mg/24hr patch Place 1 patch (21 mg total) onto the skin daily. 28 patch 0    Musculoskeletal: Strength & Muscle Tone: within normal limits Gait & Station: normal Patient leans: N/A  Psychiatric Specialty Exam: Physical Exam  Constitutional: He is oriented to person, place, and time. He appears well-developed and well-nourished.  Respiratory: Effort normal.  Musculoskeletal: Normal range of motion.  Neurological: He is alert and oriented to person, place, and time.  Psychiatric: His speech is normal and behavior is normal. Thought content normal. His mood appears anxious. Cognition and memory are normal. He expresses impulsivity.    Review of Systems  Psychiatric/Behavioral: Positive for depression, substance abuse and suicidal ideas (passive). Negative for hallucinations and memory loss. The patient is nervous/anxious. The patient does not have insomnia.     Blood pressure (!) 143/89, pulse 63, temperature 98 F (36.7 C), temperature  source Oral, resp. rate 18, height 5' 9"  (1.753 m), SpO2 99 %.There is no height or weight on file to calculate BMI.  General Appearance: Casual  Eye Contact:  Good  Speech:  Clear and Coherent and Normal Rate  Volume:  Normal  Mood:  Anxious and Depressed  Affect:  Congruent and Depressed  Thought Process:  Coherent and Linear  Orientation:  Full (Time, Place, and Person)  Thought  Content:  Logical  Suicidal Thoughts:  No  Homicidal Thoughts:  No  Memory:  Immediate;   Good Recent;   Good Remote;   Fair  Judgement:  Fair  Insight:  Fair  Psychomotor Activity:  Normal  Concentration:  Concentration: Good and Attention Span: Good  Recall:  Good  Fund of Knowledge:  Good  Language:  Good  Akathisia:  No  Handed:  Right  AIMS (if indicated):     Assets:  Agricultural consultant Housing Resilience Social Support  ADL's:  Intact  Cognition:  WNL  Sleep:        Treatment Plan Summary: Plan Discharge Home  Follow up with [facility] for therapy and medication management Avoid the use of alcohol and illicit drugs Take medications only as prescribed   Disposition: No evidence of imminent risk to self or others at present.   Patient does not meet criteria for psychiatric inpatient admission. Supportive therapy provided about ongoing stressors. Discussed crisis plan, support from social network, calling 911, coming to the Emergency Department, and calling Suicide Hotline.  Ethelene Hal, NP 12/14/2016 1:13 PM   Patient seen face-to-face for the psychiatric evaluation, case discussed with physician extender in treatment team and formulated treatment plan. Reviewed the information documented and agree with the treatment plan.  Kayren Holck 12/14/2016 4:15 PM

## 2016-12-14 NOTE — ED Notes (Signed)
SBAR Report received from previous nurse. Pt received calm and visible on unit. Pt endorses current SI, but denies HI, A/V H,  anxiety, or pain at this time, and appears otherwise stable and free of distress. Pt reminded of camera surveillance, q 15 min rounds, and rules of the milieu. Pt asked Probation officer for food stating that he has been on a 3 day crack binge, pt provided with sandwich and soda before midnight. Will continue to assess.

## 2017-02-03 ENCOUNTER — Other Ambulatory Visit: Payer: Self-pay | Admitting: Nurse Practitioner

## 2017-02-03 DIAGNOSIS — B182 Chronic viral hepatitis C: Secondary | ICD-10-CM

## 2017-02-13 ENCOUNTER — Other Ambulatory Visit: Payer: Self-pay

## 2017-02-18 ENCOUNTER — Other Ambulatory Visit: Payer: Self-pay

## 2017-06-24 NOTE — Telephone Encounter (Signed)
Error

## 2019-11-09 ENCOUNTER — Other Ambulatory Visit: Payer: Self-pay

## 2019-11-09 ENCOUNTER — Encounter: Payer: Self-pay | Admitting: Internal Medicine

## 2019-11-09 ENCOUNTER — Ambulatory Visit: Payer: Medicaid Other | Admitting: Internal Medicine

## 2019-11-09 VITALS — BP 123/96 | HR 103 | Temp 97.7°F | Ht 69.0 in | Wt 164.6 lb

## 2019-11-09 DIAGNOSIS — F17200 Nicotine dependence, unspecified, uncomplicated: Secondary | ICD-10-CM

## 2019-11-09 DIAGNOSIS — R634 Abnormal weight loss: Secondary | ICD-10-CM

## 2019-11-09 DIAGNOSIS — F1010 Alcohol abuse, uncomplicated: Secondary | ICD-10-CM | POA: Diagnosis not present

## 2019-11-09 DIAGNOSIS — Z8619 Personal history of other infectious and parasitic diseases: Secondary | ICD-10-CM

## 2019-11-09 DIAGNOSIS — Z87898 Personal history of other specified conditions: Secondary | ICD-10-CM

## 2019-11-09 NOTE — Patient Instructions (Addendum)
Thank you for allowing [place] to provide your care today. Today we discussed your weight loss    I have ordered cbc, cmp, hgb a1c, tsh, lipid panel labs for you. I will call if any are abnormal.    Today we made no changes to your medications.    Please follow-up 3 months.    Should you have any questions or concerns please call the internal medicine clinic at [phone].     Preventing Complications From Unhealthy Weight Loss Behaviors, Adult Reaching and maintaining a healthy weight is important for your overall health. It is natural to want to lose weight quickly, using whatever methods seem fastest. However, losing weight in a healthy way is not a quick process. Instead, aim for slow, steady weight loss. What lifestyle changes can be made? You can make certain lifestyle changes to help you lose weight in a healthy way. These include eating nutritious foods and exercising regularly. What to avoid:  Following a diet that restricts entire types of food.  Skipping meals to save calories. It is especially important to eat breakfast.  Not eating anything for long periods of time (fasting).  Restricting your calories to far less than the amount that you need to lose or maintain a healthy weight.  Compulsively getting an extreme amount of exercise.  Taking laxative pills to make you have more frequent bowel movements.  Taking medicines to make your body lose excess fluids (diuretics).  Eating an excessive amount of food (bingeing), then making yourself vomit (purging). Healthy behaviors:   Eat a variety of healthy foods, including: ? Fruits and vegetables. ? Whole grains. ? Lean proteins. ? Low-fat dairy products.  Drink water instead of sugary drinks.  Plan healthy, low-calorie meals. Work with a Financial planner (dietitian) to make a healthy meal plan that works for you and to make an exercise program. ? Include different types of exercise in your exercise program, such as  strengthening, aerobic, and flexibility exercises. ? To maintain your weight, get at least 150 minutes of moderate-intensity exercise every week. Moderate-intensity exercise could be brisk walking or biking. ? To lose a healthy amount of weight, get 60 minutes of moderate-intensity exercise each day.  Find ways to reduce stress, such as regular exercise or meditation.  Find a hobby or other activity that you enjoy to distract you from eating when you feel stressed or bored. Why are these changes important? Making these changes will improve your overall health. Maintaining a healthy weight also lowers your risk of certain conditions, including:  Heart disease.  High cholesterol.  High blood pressure.  Type 2 diabetes.  Stroke.  Osteoarthritis.  Osteoporosis.  Some types of cancer.  Breathing and sleeping disorders. What can happen if changes are not made? Using disordered eating or other unhealthy eating behaviors to try to lose weight can cause:  Fatigue.  Imbalances in electrolytes and chemicals that your body needs to work properly.  Organ damage or failure, especially of the kidneys.  Dehydration.  Imbalances in body fluids.  Low heart rate and blood pressure.  Thin bones that break easily.  Social isolation or relationship problems with your friends and family.  Emotional distress, including depression and anxiety.  A greater risk of an eating disorder. If you develop an eating disorder, you could develop serious health problems and complications that affect yourorgans and bodily processes. These include:  Dry skin and hair.  Hair loss.  Fainting.  Difficulty getting pregnant.  Changes in your heart muscle  and the way your heart works.  Severe dehydration.  Damage to the digestive tract and digestive problems.  Long-term (chronic) gastrointestinal problems.  High blood pressure.  High cholesterol.  Heart disease.  Type 2 diabetes. Where  to find support For more support, talk with:  Your health care provider or dietitian. Ask about support groups.  A mental health care provider.  Family and friends. Where to find more information Learn more about how to prevent complications from unhealthy weight loss behaviors from:  Centers for Disease Control and Prevention: https://www.cervantes-rivera.net/  Accident: PureMess.co.nz.shtml  National Eating Disorders Association: www.nationaleatingdisorders.org Contact a health care provider if:  You often feel very tired.  You notice changes in your skin or your hair.  You faint because of dehydration or too much exercise.  You struggle to change your unhealthy weight loss behaviors on your own.  Unhealthy weight loss behaviors are affecting your daily life.  You have signs or symptoms of an eating disorder.  You have major weight changes in a short period of time.  You feel guilty or ashamed about eating or exercising.  You have trouble with your relationships because of your weight loss habits. Summary  Aim for slow, steady weight loss by choosing healthy foods, getting enough calories every day, and exercising regularly.  If you cannot make these changes by yourself, or if you think that you may have an eating disorder, contact your health care provider. This information is not intended to replace advice given to you by your health care provider. Make sure you discuss any questions you have with your health care provider. Document Revised: 03/14/2017 Document Reviewed: 04/16/2015 Elsevier Patient Education  2020 [employer].

## 2019-11-09 NOTE — Progress Notes (Signed)
CC: Weight loss  HPI: Mr.Demaree E Slinker is a 54 y.o. with PMH listed below presenting with complaint of weight loss. Please see problem based assessment and plan for further details.   Past Medical History:  Diagnosis Date  . Alcoholism ([place])   . Anxiety   . Depression   . GERD (gastroesophageal reflux disease)   . Heart murmur   . Hepatitis C    Treated in prison for 18 months.  Sounds like Ribaviron and Interferon  2009-2011  . Hypertension   . Peptic ulcer   . Substance abuse ([place])   . Urge urinary incontinence    07/20/2015:  evaluated by Dr. Kathie Rhodes, Urodynamics planned   Past Surgical History:  Procedure Laterality Date  . APPENDECTOMY    . INGUINAL HERNIA REPAIR Bilateral    Social History Lives with fiance. Currently unemployed. Mentions being bored at home. Drinks 2x 40oz beer daily (mentions previously drinking more than this). Smokes 1 pack daily. Previously used cocaine but not currently using. Smokes marijuana currently.  Family History Denies any clinically relevant family history  Review of Systems: Review of Systems  Constitutional: Negative for chills, fever and malaise/fatigue.  Eyes: Negative for blurred vision.  Respiratory: Negative for shortness of breath.   Cardiovascular: Negative for chest pain, palpitations and leg swelling.  Gastrointestinal: Negative for constipation, diarrhea, nausea and vomiting.  Neurological: Negative for dizziness and focal weakness.  All other systems reviewed and are negative.  Physical Exam: Vitals:   11/09/19 0955  BP: (!) 123/96  Pulse: 103  Temp: 97.7 F (36.5 C)  TempSrc: Oral  SpO2: 96%  Weight: 164 lb 9.6 oz (74.7 kg)  Height: 5\' 9"  (1.753 m)   Gen: Well-developed, well nourished, NAD HEENT: NCAT head, hearing intact, poor dentition with missing front teeth,  CV: RRR, S1, S2 normal Pulm: CTAB, No rales, no wheezes Extm: ROM intact, Peripheral pulses intact, No peripheral edema Skin: Dry,  Warm, normal turgor, no jaundice  Assessment & Plan:   Tobacco use disorder Current pack daily smoking history. Mentions using nicotine patches to assist in decreasing use in the past. Not interested in trying alternative methods for tobacco cessation.  - C/w advise tobacco cessation - Meets criteria for lung cancer screening w/ low dose Chest CT  History of hepatitis C virus infection Has hx of hepatitis C s/p completion of treatment noted on 2015. Continues to endorse significant alcohol use. Denies any nausea, vomiting, jaundice or tremors. Noted to have worsening unintentional weight loss. Multiple risk factors for HCC. Will begin work-up with CMP  - CMP  Addendum: Noted to have elevated LFTs, possibly due to current alcohol use. Will get RUQ ultrasound to further assess.  Weight loss, unintentional Mr.Swinson is a 54 yo M w/ PMH of schizoaffective disorder presenting to [facility] to establish care. He mentions that he has lost about 40 pounds in the last 7 months and states he is concerned. He denies any inciting event or intention to lose weight. He states he had a bad fall around the time where he chipped his teeth as he fell and is currently multiple teeth that needs to be replaced. He mentions having difficulty with eating due to these missing teeth and endorse poor appetite. He also mentions having exacerbation for his depressive symptoms due to his teeth difficulties. He states he has been trying to get dentures to replace teeth but Medicaid denied coverage.  A/P Present w/ unintentional weight loss. Possibly due to impaired mastication  from missing teeth. Will need further work-up to assess for other medical reasoning for weight loss. Will get TSH, CMp, cbc. Meanwhile, I believe these dentures are medically necessary to improve his health and overall functioning.  - F/u age-appropriate cancer screening - CMP, CBC, TSH - Care management referral for assistance w/ finding coverage for  dentures  Alcohol use disorder, mild, abuse Mentions current alcohol use of 40oz x2 beer daily. States he previously drank significant more amount but has weaned himself down. Discussed risks of continued alcohol use. Mr.Gaspari expressed understanding. Denies prior withdrawal symptoms. When asking about medications to assist with alcohol cessation, Mr.Baquera states he will try to quit on his own. Advised to wean down use to prevent withdrawal. Mr.Stacy expressed understanding.  - CMP - C/w encourage alcohol cessation  History of prediabetes Noted to have elevated hgb a1c at 6.1 on chart review in 2016. Currently denies any polyuria, polydipsia, polyphagia. States has had significant weight loss since he was diagnosed with pre-diabetes. Will re-assess for progression.  - Hgb a1c - lipid panel    Patient discussed with Dr. Jimmye Norman  -Gilberto Better, [facility] Pager: [phone]

## 2019-11-10 ENCOUNTER — Encounter: Payer: Self-pay | Admitting: Internal Medicine

## 2019-11-10 DIAGNOSIS — F1020 Alcohol dependence, uncomplicated: Secondary | ICD-10-CM | POA: Insufficient documentation

## 2019-11-10 DIAGNOSIS — Z87898 Personal history of other specified conditions: Secondary | ICD-10-CM | POA: Insufficient documentation

## 2019-11-10 DIAGNOSIS — R634 Abnormal weight loss: Secondary | ICD-10-CM | POA: Insufficient documentation

## 2019-11-10 LAB — CMP14 + ANION GAP
ALT: 90 IU/L — ABNORMAL HIGH (ref 0–44)
AST: 116 IU/L — ABNORMAL HIGH (ref 0–40)
Albumin/Globulin Ratio: 1.5 (ref 1.2–2.2)
Albumin: 4 g/dL (ref 3.8–4.9)
Alkaline Phosphatase: 71 IU/L (ref 48–121)
Anion Gap: 14 mmol/L (ref 10.0–18.0)
BUN/Creatinine Ratio: 10 (ref 9–20)
BUN: 11 mg/dL (ref 6–24)
Bilirubin Total: 0.8 mg/dL (ref 0.0–1.2)
CO2: 21 mmol/L (ref 20–29)
Calcium: 8.9 mg/dL (ref 8.7–10.2)
Chloride: 103 mmol/L (ref 96–106)
Creatinine, Ser: 1.06 mg/dL (ref 0.76–1.27)
GFR calc Af Amer: 90 mL/min/{1.73_m2} (ref >59)
GFR calc non Af Amer: 78 mL/min/{1.73_m2} (ref >59)
Globulin, Total: 2.6 g/dL (ref 1.5–4.5)
Glucose: 90 mg/dL (ref 65–99)
Potassium: 4.1 mmol/L (ref 3.5–5.2)
Sodium: 138 mmol/L (ref 134–144)
Total Protein: 6.6 g/dL (ref 6.0–8.5)

## 2019-11-10 LAB — LIPID PANEL
Chol/HDL Ratio: 3.2 ratio (ref 0.0–5.0)
Cholesterol, Total: 177 mg/dL (ref 100–199)
HDL: 55 mg/dL (ref >39)
LDL Chol Calc (NIH): 78 mg/dL (ref 0–99)
Triglycerides: 270 mg/dL — ABNORMAL HIGH (ref 0–149)
VLDL Cholesterol Cal: 44 mg/dL — ABNORMAL HIGH (ref 5–40)

## 2019-11-10 LAB — CBC
Hematocrit: 40.7 % (ref 37.5–51.0)
Hemoglobin: 13.6 g/dL (ref 13.0–17.7)
MCH: 29.2 pg (ref 26.6–33.0)
MCHC: 33.4 g/dL (ref 31.5–35.7)
MCV: 88 fL (ref 79–97)
Platelets: 218 10*3/uL (ref 150–450)
RBC: 4.65 x10E6/uL (ref 4.14–5.80)
RDW: 13.6 % (ref 11.6–15.4)
WBC: 5.3 10*3/uL (ref 3.4–10.8)

## 2019-11-10 LAB — HEMOGLOBIN A1C
Est. average glucose Bld gHb Est-mCnc: 114 mg/dL
Hgb A1c MFr Bld: 5.6 % (ref 4.8–5.6)

## 2019-11-10 LAB — TSH: TSH: 0.477 u[IU]/mL (ref 0.450–4.500)

## 2019-11-10 NOTE — Assessment & Plan Note (Signed)
Noted to have elevated hgb a1c at 6.1 on chart review in 2016. Currently denies any polyuria, polydipsia, polyphagia. States has had significant weight loss since he was diagnosed with pre-diabetes. Will re-assess for progression.  - Hgb a1c - lipid panel

## 2019-11-10 NOTE — Assessment & Plan Note (Addendum)
Current pack daily smoking history. Mentions using nicotine patches to assist in decreasing use in the past. Not interested in trying alternative methods for tobacco cessation.  - C/w advise tobacco cessation - Meets criteria for lung cancer screening w/ low dose Chest CT

## 2019-11-10 NOTE — Assessment & Plan Note (Addendum)
Mentions current alcohol use of 40oz x2 beer daily. States he previously drank significant more amount but has weaned himself down. Discussed risks of continued alcohol use. Ethan [name] expressed understanding. Denies prior withdrawal symptoms. When asking about medications to assist with alcohol cessation, Ethan [name] states he will try to quit on his own. Advised to wean down use to prevent withdrawal. Ethan [name] expressed understanding.  - CMP - C/w encourage alcohol cessation

## 2019-11-10 NOTE — Assessment & Plan Note (Addendum)
Has hx of hepatitis C s/p completion of treatment noted on 2015. Continues to endorse significant alcohol use. Denies any nausea, vomiting, jaundice or tremors. Noted to have worsening unintentional weight loss. Multiple risk factors for HCC. Will begin work-up with CMP  - CMP  Addendum: Noted to have elevated LFTs, possibly due to current alcohol use. Will get RUQ ultrasound to further assess.

## 2019-11-10 NOTE — Assessment & Plan Note (Addendum)
Ethan [name] is a 54 yo M w/ PMH of schizoaffective disorder presenting to [facility] to establish care. He mentions that he has lost about 40 pounds in the last 7 months and states he is concerned. He denies any inciting event or intention to lose weight. He states he had a bad fall around the time where he chipped his teeth as he fell and is currently multiple teeth that needs to be replaced. He mentions having difficulty with eating due to these missing teeth and endorse poor appetite. He also mentions having exacerbation for his depressive symptoms due to his teeth difficulties. He states he has been trying to get dentures to replace teeth but Medicaid denied coverage.  A/P Present w/ unintentional weight loss. Possibly due to impaired mastication from missing teeth. Will need further work-up to assess for other medical reasoning for weight loss. Will get TSH, CMp, cbc. Meanwhile, I believe these dentures are medically necessary to improve his health and overall functioning.  - F/u age-appropriate cancer screening - CMP, CBC, TSH - Care management referral for assistance w/ finding coverage for dentures

## 2019-11-11 ENCOUNTER — Telehealth: Payer: Self-pay

## 2019-11-11 NOTE — Chronic Care Management (AMB) (Signed)
  Care Management   Note  11/11/2019 Name: Ethan [name] MRN: [id number] DOB: 1966-04-14  Ethan [name] is a 54 y.o. year old male who is a primary care patient of Riesa Pope, MD. I reached out to Waldemar Dickens by phone today in response to a referral sent by Ethan [name] health plan.    [name] was given information about care management services today including:  1. Care management services include personalized support from designated clinical staff supervised by his physician, including individualized plan of care and coordination with other care providers 2. 24/7 contact phone numbers for assistance for urgent and routine care needs. 3. The patient may stop care management services at any time by phone call to the office staff.  Patient agreed to services and verbal consent obtained.   Follow up plan: Telephone appointment with care management team member scheduled for:11/15/2019  Noreene Larsson, [place], [place], [place] [address] Direct Dial: [phone] [email or web address]: [email or web address]

## 2019-11-12 NOTE — Progress Notes (Signed)
Internal Medicine Clinic Attending  Case discussed with Dr. Lee  At the time of the visit.  We reviewed the resident's history and exam and pertinent patient test results.  I agree with the assessment, diagnosis, and plan of care documented in the resident's note.    

## 2019-11-15 ENCOUNTER — Ambulatory Visit: Payer: Medicaid Other | Admitting: *Deleted

## 2019-11-15 DIAGNOSIS — F258 Other schizoaffective disorders: Secondary | ICD-10-CM

## 2019-11-15 DIAGNOSIS — Z87898 Personal history of other specified conditions: Secondary | ICD-10-CM

## 2019-11-15 DIAGNOSIS — K2100 Gastro-esophageal reflux disease with esophagitis, without bleeding: Secondary | ICD-10-CM

## 2019-11-15 DIAGNOSIS — R634 Abnormal weight loss: Secondary | ICD-10-CM

## 2019-11-15 NOTE — Chronic Care Management (AMB) (Signed)
Care Management   Initial Visit Note  11/15/2019 Name: Ethan [name] MRN: [id number] DOB: 08/16/1965  Subjective: Patient states he needs helps with filing the appeal for Medicaid denial for dental care and says he struggles to buy food and pay rent, bills, etc.    Objective: Wt Readings from Last 3 Encounters:  11/09/19 164 lb 9.6 oz (74.7 kg)  11/16/15 191 lb (86.6 kg)  10/18/15 197 lb (89.4 kg)    Assessment: Ethan [name] is a 54 y.o. year old male who sees Katsadouros, Candace Gallus, MD for primary care. The care management team was consulted for assistance with care management and care coordination needs related to Disease Management Educational Needs Care Coordination Medication Management and Education.   Review of patient status, including review of consultants reports, relevant laboratory and other test results, and collaboration with appropriate care team members and the patient's provider was performed as part of comprehensive patient evaluation and provision of care management services.    SDOH (Social Determinants of Health) assessments performed: Yes See Care Plan activities for detailed interventions related to SDOH)  SDOH Interventions     Most Recent Value  SDOH Interventions  Food Insecurity Interventions Other (Comment)  [referral to care guide]  Financial Strain Interventions --  [referral to care guide]  Housing Interventions Other (Comment)  [referral to care guide]  Physical Activity Interventions Intervention Not Indicated  Social Connections Interventions Intervention Not Indicated  Transportation Interventions Intervention Not Indicated       Outpatient Encounter Medications as of 11/15/2019  Medication Sig   amLODipine (NORVASC) 5 MG tablet Take 5 mg by mouth daily.   hydrOXYzine (ATARAX/VISTARIL) 25 MG tablet Take 1 tablet (25 mg total) by mouth every 6 (six) hours as needed for anxiety.   pantoprazole (PROTONIX) 40 MG tablet Take 1 tablet (40 mg  total) by mouth daily.   risperiDONE (RISPERDAL) 0.5 MG tablet Take 1 tablet (0.5 mg total) by mouth 2 (two) times daily.   valsartan-hydrochlorothiazide (DIOVAN-HCT) 160-12.5 MG tablet Take 1 tablet by mouth daily.   nicotine (NICODERM CQ - DOSED IN MG/24 HOURS) 21 mg/24hr patch Place 1 patch (21 mg total) onto the skin daily. (Patient not taking: Reported on 11/15/2019)   traZODone (DESYREL) 50 MG tablet Take 1 tablet (50 mg total) by mouth at bedtime and may repeat dose one time if needed.   No facility-administered encounter medications on file as of 11/15/2019.    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  Interventions:   Evaluation of current treatment plan related to hypertension self management and patient's adherence to plan as established by provider.  Provided education to patient re: stroke prevention, s/s of heart attack and stroke, DASH diet, complications of uncontrolled blood pressure-mailed "Blood Pressure  Control" booklet and [facility] spiral bound calendar with HTN education and business cards of CCM RN and CCM BSW  Reviewed medications with patient and discussed importance of compliance  Discussed plans with patient for ongoing care management follow up and provided patient with direct contact information for care management team  Advised patient, providing education and rationale, to monitor blood pressure daily and record, calling PCP for findings outside established parameters.   Reviewed scheduled/upcoming provider appointments including: abdominal ultrasound and CT of chest on 11/26/19 at [place] provider of patient's request for refills on maintenance medications to [facility] on [address]  Patient Self Care Activities:   Self administers medications as prescribed  Attends all scheduled provider appointments  Calls provider office for new concerns, questions, or BP outside discussed parameters  Checks BP and records as discussed  Follows a low sodium diet/DASH diet  Initial goal documentation        "I've lost a lot of weight because of my broken teeth and Medicaid is denying the dental care I need." (pt-stated)        [place] (see longitudinal plan of care for additional care plan information)  Current Barriers:   Care Coordination needs related to Medicaid denial for dental services and food and financial insecurities in a patient with GERD and unintentional weight loss.- patient states he has until 11/19/19 to appeal the Medicaid denial for dental services and says he has submitted his portion of the appeal and that it was his understanding that Dr Truman Hayward was going to send a letter of medical necessity, he is also requesting that refills of his maintenance medications be called to [facility] on [address]  Nurse Case Manager Clinical Goal(s):   Over the next 30-90 days, patient will work with community care  guide to receive resources to address dental needs, and financial and food insecurity.  Interventions:   Inter-disciplinary care team collaboration (see longitudinal plan of care)  Initial phone assessment completed  Care Guide referral for assist with Medicaid denial appeal and dental care and food and financial insecurity.  Patient Self Care Activities:   Patient verbalizes understanding of plan to work with care guide to address dental care, assist with Medicaid denial appeal and food and financial insecurity.  Self administers medications as prescribed  Attends all scheduled provider appointments  Calls pharmacy for medication refills  Performs ADL's independently  Performs IADL's independently  Calls provider office for new concerns or questions  Unable to independently file Medicaid denial  appeal, obtain food and financial resources  Initial goal documentation         Follow up plan:  The care management team will reach out to the patient again over the next 30-60 days.   Mr. Weekly was given information about Care Management services today including:  1. Care Management services include personalized support from designated clinical staff supervised by a physician, including individualized plan of care and coordination with other care providers 2. 24/7 contact phone numbers for assistance for urgent and routine care needs. 3. The patient may stop  Care Management services at any time (effective at the end of the month) by phone call to the office staff.  Patient agreed to services and verbal consent obtained.  Kelli Churn RN, CCM, [facility] RN Care Manager [phone]

## 2019-11-15 NOTE — Patient Instructions (Signed)
Visit Information It was nice speaking with you today. A care guide will be contacting you about the Medicaid denial and food and financial resources.   Goals Addressed              This Visit's Progress     Patient Stated   .  "I check my blood pressure at home and take my medications like I am suppose to." (pt-stated)        [place] (see longitudinal plan of care for additional care plan information)  Objective:  . Last practice recorded BP readings:  BP Readings from Last 3 Encounters:  11/09/19 (!) 123/96  12/14/16 140/84  11/27/16 120/89 .   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  Interventions:  . Evaluation of current treatment plan related to hypertension self management and patient's adherence to plan as established by provider. . Provided education to patient re: stroke prevention, s/s of heart attack and stroke, DASH diet, complications of uncontrolled blood pressure-mailed "Blood Pressure Control" booklet and [facility] spiral bound calendar with HTN education and business cards of CCM RN and CCM BSW . Reviewed medications with patient and discussed importance of compliance . Discussed plans with patient for ongoing care management follow up and provided patient with direct contact information for care management team . Advised patient, providing education and rationale, to  monitor blood pressure daily and record, calling PCP for findings outside established parameters.  . Reviewed scheduled/upcoming provider appointments including: abdominal ultrasound and CT of chest on 11/26/19 at [place] provider of patient's request for refills on maintenance medications to [facility] on [address]  Patient Self Care Activities:  . Self administers medications as prescribed . Attends all scheduled provider appointments . Calls provider office for new concerns, questions, or BP outside discussed parameters . Checks BP and records as discussed . Follows a low sodium diet/DASH diet  Initial goal documentation      .  "I've lost a lot of weight because of my broken teeth and Medicaid is denying the dental care I need." (pt-stated)        CARE PLAN ENTRY (see longitudinal plan of care for additional care plan information)  Current Barriers:  . Care Coordination needs related to Medicaid denial for dental services and food and financial insecurities in a patient with GERD and unintentional weight loss.- patient states he has until 11/19/19 to appeal the Medicaid denial for dental services and says he has submitted his portion of the appeal and that it was his understanding that Dr Truman Hayward was going to send a letter of medical necessity, he is also requesting that refills of his maintenance medications be called to [facility] on [address]):  [name] Over the next 30-90 days, patient will work with community care guide to receive resources to address dental needs, and financial and food insecurity.  Interventions:  . Inter-disciplinary care team collaboration (see longitudinal plan of care) . Initial phone assessment completed . Care Guide referral for assist with Medicaid denial appeal and dental care and food and financial insecurity.  Patient Self Care Activities:  . Patient verbalizes understanding of plan to work with  care guide to address dental care, assist with Medicaid denial appeal and food and financial insecurity. . Self administers medications as prescribed . Attends all scheduled provider appointments . Calls pharmacy for medication refills . Performs ADL's independently . Performs IADL's independently . Calls provider office for new concerns or questions . Unable to independently file Medicaid denial  appeal, obtain food and financial resources  Initial goal documentation        Mr. Schoenberg was given information about Care Management services today including:  1. Care Management services include personalized support from designated clinical staff supervised by his physician, including individualized plan of care and coordination with other care providers 2. 24/7 contact phone numbers for assistance for urgent and routine care needs. 3. The patient may stop CCM services at any time (effective at the end of the month) by phone call to the office staff.  Patient agreed to services and verbal consent obtained.   The patient verbalized understanding of instructions provided today and declined a print copy of patient instruction materials.   The care management team will reach out to the patient again over the next 30-60 days.   Kelli Churn RN, CCM, [facility] RN Care Manager [phone]

## 2019-11-16 ENCOUNTER — Ambulatory Visit: Payer: Medicaid Other | Admitting: *Deleted

## 2019-11-16 DIAGNOSIS — R634 Abnormal weight loss: Secondary | ICD-10-CM

## 2019-11-16 DIAGNOSIS — Z87898 Personal history of other specified conditions: Secondary | ICD-10-CM

## 2019-11-16 MED ORDER — TRAZODONE HCL 50 MG PO TABS: 50.0000 mg | ORAL_TABLET | Freq: Every evening | ORAL | 0 refills | Status: DC | PRN

## 2019-11-16 MED ORDER — RISPERIDONE 0.5 MG PO TABS: 0.5000 mg | ORAL_TABLET | Freq: Two times a day (BID) | ORAL | 0 refills | Status: DC

## 2019-11-16 MED ORDER — VALSARTAN-HYDROCHLOROTHIAZIDE 160-12.5 MG PO TABS
1.0000 | ORAL_TABLET | Freq: Every day | ORAL | 1 refills | Status: DC
Start: 2019-11-16 — End: 2020-10-24

## 2019-11-16 MED ORDER — AMLODIPINE BESYLATE 5 MG PO TABS: 5.0000 mg | ORAL_TABLET | Freq: Every day | ORAL | 1 refills | Status: DC

## 2019-11-16 MED ORDER — PANTOPRAZOLE SODIUM 40 MG PO TBEC: 40.0000 mg | DELAYED_RELEASE_TABLET | Freq: Every day | ORAL | 1 refills | Status: DC

## 2019-11-16 MED ORDER — HYDROXYZINE HCL 25 MG PO TABS: 25.0000 mg | ORAL_TABLET | Freq: Four times a day (QID) | ORAL | 0 refills | Status: DC | PRN

## 2019-11-16 NOTE — Progress Notes (Signed)
Internal Medicine Clinic Attending  CCM services provided by the care management provider and their documentation were discussed with Dr. Sherry Ruffing. We reviewed the pertinent findings, urgent action items addressed by the resident and non-urgent items to be addressed by the PCP.  I agree with the assessment, diagnosis, and plan of care documented in the CCM and resident's note.  Gilles Chiquito, MD 11/16/2019

## 2019-11-16 NOTE — Addendum Note (Signed)
Addended by: Asencion Noble on: 11/16/2019 08:59 AM   Modules accepted: Orders

## 2019-11-16 NOTE — Chronic Care Management (AMB) (Signed)
  Care Management   Follow Up Note   11/16/2019 Name: Ethan [name] MRN: [id number] DOB: 10-03-1965  Referred by: Riesa Pope, MD Reason for referral : Care Coordination (Prediabetes, HTN)   Ethan [name] is a 54 y.o. year old male who is a primary care patient of Katsadouros, Candace Gallus, MD. The care management team was consulted for assistance with care management and care coordination needs.    Review of patient status, including review of consultants reports, relevant laboratory and other test results, and collaboration with appropriate care team members and the patient's provider was performed as part of comprehensive patient evaluation and provision of chronic care management services.    SDOH (Social Determinants of Health) assessments performed: No See Care Plan activities for detailed interventions related to [facility])     Advanced Directives: See Care Plan and Vynca application for related entries.   Goals Addressed              This Visit's Progress     Patient Stated   .  "I've lost a lot of weight because of my broken teeth and Medicaid is denying the dental care I need." (pt-stated)        CARE PLAN ENTRY (see longitudinal plan of care for additional care plan information)  Current Barriers:  . Care Coordination needs related to Medicaid denial for dental services and food and financial insecurities in a patient with GERD and unintentional weight loss.- patient states he has until 11/19/19 to appeal the Medicaid denial for dental services and says he has submitted his portion of the appeal and that it was his understanding that Dr Truman Hayward was going to send a letter of medical necessity, he is also requesting that refills of his maintenance medications be called to [facility] on [address]):  [name] Over the next 30-90 days, patient will work with community care guide to receive resources to address dental needs, and financial and  food insecurity.  Interventions:  . Inter-disciplinary care team collaboration (see longitudinal plan of care) . Phone call and email to [facility] requesting return call to clarify  the providers role in the Medicaid denial of pt's dental care.  . Patient Self Care Activities:  . Patient verbalizes understanding of plan to work with care guide to address dental care, assist with Medicaid denial appeal and food and financial insecurity. . Self administers medications as prescribed . Attends all scheduled provider appointments . Calls pharmacy for medication refills . Performs ADL's independently . Performs IADL's independently . Calls provider office for new concerns or questions . Unable to independently file Medicaid denial  appeal, obtain food and financial resources  Please see past updates related to this goal by clicking on the "Past Updates" button in the selected goal          The care management team will reach out to the patient again over the next 30-60 days.   Kelli Churn RN, CCM, [facility] RN Care Manager [phone]

## 2019-11-16 NOTE — Progress Notes (Signed)
Internal Medicine Clinic Resident  I have personally reviewed this encounter including the documentation in this note and/or discussed this patient with the care management provider. I will address any urgent items identified by the care management provider and will communicate my actions to the patient's PCP. I have refilled his chronic medications to his new pharmacy. I have reviewed the patient's CCM visit with my supervising attending, Dr Daryll Drown.  Asencion Noble, MD 11/16/2019

## 2019-11-18 NOTE — Progress Notes (Signed)
DOS 11/09/19:  Internal Medicine Clinic Attending  Case discussed with Dr. Truman Hayward  At the time of the visit.  We reviewed the resident's history and exam and pertinent patient test results.  I agree with the assessment, diagnosis, and plan of care documented in the resident's note.

## 2019-11-23 ENCOUNTER — Telehealth: Payer: Self-pay | Admitting: Student

## 2019-11-24 ENCOUNTER — Telehealth: Payer: Self-pay | Admitting: Student

## 2019-11-25 ENCOUNTER — Telehealth: Payer: Self-pay

## 2019-11-25 ENCOUNTER — Telehealth: Payer: Self-pay | Admitting: *Deleted

## 2019-11-25 ENCOUNTER — Ambulatory Visit: Payer: Medicaid Other | Admitting: *Deleted

## 2019-11-25 DIAGNOSIS — R634 Abnormal weight loss: Secondary | ICD-10-CM

## 2019-11-25 DIAGNOSIS — Z87898 Personal history of other specified conditions: Secondary | ICD-10-CM

## 2019-11-25 NOTE — Chronic Care Management (AMB) (Signed)
Care Management   Follow Up Note   11/25/2019 Name: Ethan [name] MRN: [id number] DOB: 10/07/1965  Referred by: Riesa Pope, MD Reason for referral : Care Coordination (HTN, prediabetes)   Ethan [name] is a 54 y.o. year old male who is a primary care patient of Katsadouros, Candace Gallus, MD. The care management team was consulted for assistance with care management and care coordination needs.    Review of patient status, including review of consultants reports, relevant laboratory and other test results, and collaboration with appropriate care team members and the patient's provider was performed as part of comprehensive patient evaluation and provision of chronic care management services.    SDOH (Social Determinants of Health) assessments performed: No See Care Plan activities for detailed interventions related to [facility])     Advanced Directives: See Care Plan and Vynca application for related entries.   Goals Addressed              This Visit's Progress     Patient Stated   .  "I don't feel safe where I'm living right now and I was told I would be referred to a social worker to help me." (pt-stated)        [place] (see longitudinal plan of care for additional care plan information)   Current Barriers:  . Chronic Disease Management support, education, and care coordination needs related to HTN and prediabetes - patient called the clinic stating his provider told him he would ask the clinic social worker to assist him with finding another place to live as he does not feel safe at his current residence  Case Manager Clinical Goal(s):  [name] Over the next 180 days, patient will work with BSW to address needs related to Housing barriers in patient with HTN and prediabetes  Interventions:  . Collaborated with BSW to initiate plan of care to address needs related to Housing barriers in patient with HTN and prediabetes  Patient Self Care Activities:  . Patient  verbalizes understanding of plan to work with BSW to discuss housing options  Initial goal documentation     .  "I've lost a lot of weight because of my broken teeth and Medicaid is denying the dental care I need." (pt-stated)        CARE PLAN ENTRY (see longitudinal plan of care for additional care plan information)  Current Barriers:  . Care Coordination needs related to Medicaid denial for dental services and food and financial insecurities in a patient with GERD and unintentional weight loss.- patient states he has until 11/19/19 to appeal the Medicaid denial for dental services and says he has submitted his portion of the appeal and that it was his understanding that Dr Truman Hayward was going to send a letter of medical necessity, he is also requesting that refills of his maintenance medications be called to [facility] on [address]):  [name] Over the next 30-90 days, patient will work with community care guide to receive resources to address dental needs, and financial and food insecurity.  Interventions:  . Inter-disciplinary care team collaboration (see longitudinal plan of care)- met with Dr. Truman Hayward in [place] office to discuss medicaid denial appeal process. Will contact Medicaid to determine current provider's role in the process . Phone call and e-mail to [facility] requesting return call to clarify  the provider's role in the Medicaid denial of pt's dental care. . Received return call from [place] at [facility]  ticket # [id number]  and was advised to allow prior approval denial appeal to go through and then if patient wishing to get another prior approval with his new provider at the clinic he may do so. Advised patient of this information when he called the clinic on 11/25/19.   [name] Patient Self Care Activities:  . Patient verbalizes understanding of plan to work with care guide to address dental care, assist with Medicaid denial appeal and food and  financial insecurity. . Self administers medications as prescribed . Attends all scheduled provider appointments . Calls pharmacy for medication refills . Performs ADL's independently . Performs IADL's independently . Calls provider office for new concerns or questions . Unable to independently file Medicaid denial  appeal, obtain food and financial resources  Please see past updates related to this goal by clicking on the "Past Updates" button in the selected goal          The care management team will reach out to the patient again over the next 30-60 days.   Kelli Churn RN, CCM, [facility] RN Care Manager [phone]

## 2019-11-25 NOTE — Telephone Encounter (Signed)
  Care Management   Outreach Note  11/25/2019 Name: Ethan [name] MRN: [id number] DOB: [birth year]  Referred by: Riesa Pope, MD Reason for referral : Care Coordination (prediabetes, HTN)   An unsuccessful telephone outreach was attempted today. The patient was referred to the case management team for assistance with care management and care coordination. Received message that patient's significant other, [name] called clinic and requested this CCM RN return her call.  Follow Up Plan: A HIPPA compliant phone message was left for the patient providing contact information and requesting a return call.   Kelli Churn RN, CCM, [facility] RN Care Manager [phone]

## 2019-11-25 NOTE — Telephone Encounter (Signed)
Pt's caregiver requesting to speak with you about dental paper work. Please call back.

## 2019-11-26 ENCOUNTER — Ambulatory Visit: Payer: Medicaid Other

## 2019-11-26 ENCOUNTER — Ambulatory Visit (HOSPITAL_COMMUNITY): Payer: Medicaid Other

## 2019-11-26 DIAGNOSIS — Z87898 Personal history of other specified conditions: Secondary | ICD-10-CM

## 2019-11-26 DIAGNOSIS — R634 Abnormal weight loss: Secondary | ICD-10-CM
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
## 2019-11-26 NOTE — Progress Notes (Signed)
Internal Medicine Clinic Resident  I have personally reviewed this encounter including the documentation in this note and/or discussed this patient with the care management provider. I will address any urgent items identified by the care management provider and will communicate my actions to the patient's PCP. I have reviewed the patient's CCM visit with my supervising attending, Dr Hoffman.  Eliab Closson, MD 11/26/2019    

## 2019-11-26 NOTE — Progress Notes (Signed)
Internal Medicine Clinic Resident  I have personally reviewed this encounter including the documentation in this note and/or discussed this patient with the care management provider. I will address any urgent items identified by the care management provider and will communicate my actions to the patient's PCP. I have reviewed the patient's CCM visit with my supervising attending, Dr Heber [place].  Sanjuana Letters, MD 11/26/2019
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
## 2019-11-30 ENCOUNTER — Ambulatory Visit: Payer: Medicaid Other

## 2019-11-30 DIAGNOSIS — Z87898 Personal history of other specified conditions: Secondary | ICD-10-CM
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
## 2019-11-30 NOTE — Progress Notes (Signed)
Internal Medicine Clinic Resident  I have personally reviewed this encounter including the documentation in this note and/or discussed this patient with the care management provider. I will address any urgent items identified by the care management provider and will communicate my actions to the patient's PCP. I have reviewed the patient's CCM visit with my supervising attending, Dr Rebeca Alert.  Andrew Au, MD 11/30/2019
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
## 2019-12-01 ENCOUNTER — Ambulatory Visit: Payer: Medicaid Other

## 2019-12-01 DIAGNOSIS — Z87898 Personal history of other specified conditions: Secondary | ICD-10-CM
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
## 2019-12-01 NOTE — Progress Notes (Signed)
Internal Medicine Clinic Attending  CCM services provided by the care management provider and their documentation were reviewed with Dr. Chen.  We reviewed the pertinent findings, urgent action items addressed by the resident and non-urgent items to be addressed by the PCP.  I agree with the assessment, diagnosis, and plan of care documented in the CCM and resident's note.  Alexander N Raines, MD 12/01/2019  

## 2019-12-01 NOTE — Progress Notes (Signed)
Internal Medicine Clinic Resident  I have personally reviewed this encounter including the documentation in this note and/or discussed this patient with the care management provider. I will address any urgent items identified by the care management provider and will communicate my actions to the patient's PCP. I have reviewed the patient's CCM visit with my supervising attending, Dr Heber [place].  Andrew Au, MD 12/01/2019
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
## 2019-12-03 ENCOUNTER — Ambulatory Visit
Admission: RE | Admit: 2019-12-03 | Discharge: 2019-12-03 | Disposition: A | Discharge status: Home / Self Care | Payer: Medicaid Other | Source: Ambulatory Visit | Attending: Internal Medicine | Admitting: Internal Medicine

## 2019-12-03 DIAGNOSIS — R634 Abnormal weight loss: Secondary | ICD-10-CM

## 2019-12-15 ENCOUNTER — Telehealth: Payer: Medicaid Other

## 2019-12-15 ENCOUNTER — Other Ambulatory Visit: Payer: Self-pay | Admitting: Internal Medicine

## 2019-12-15 ENCOUNTER — Ambulatory Visit: Payer: Medicaid Other

## 2019-12-15 DIAGNOSIS — R634 Abnormal weight loss: Secondary | ICD-10-CM

## 2019-12-15 DIAGNOSIS — Z87898 Personal history of other specified conditions: Secondary | ICD-10-CM
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
## 2019-12-16 ENCOUNTER — Ambulatory Visit: Payer: Medicaid Other | Admitting: *Deleted

## 2019-12-16 ENCOUNTER — Telehealth: Payer: Self-pay | Admitting: *Deleted

## 2019-12-16 ENCOUNTER — Telehealth: Payer: Medicaid Other

## 2019-12-16 DIAGNOSIS — I10 Essential (primary) hypertension: Secondary | ICD-10-CM

## 2019-12-16 DIAGNOSIS — F259 Schizoaffective disorder, unspecified: Secondary | ICD-10-CM

## 2019-12-16 DIAGNOSIS — R634 Abnormal weight loss: Secondary | ICD-10-CM

## 2019-12-16 NOTE — Patient Instructions (Signed)
Visit Information It was nice speaking with you today. I will call you about your CT of lungs and home BP monitor.  Goals Addressed              This Visit's Progress     Patient Stated   .  "I check my blood pressure at home and take my medications like I am suppose to." (pt-stated)        Ethan [name] (see longitudinal plan of care for additional care plan information)  Objective:  . Last practice recorded BP readings:  BP Readings from Last 3 Encounters:  11/09/19 (!) 123/96  12/14/16 140/84  11/27/16 120/89 .   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 and follow up assessment completed, he states he has not checked his blood pressure at home because he has been taking his medicine and feels good and has not had "hot flashes" which he correlates to his blood pressure bing high, he says he uses some else's blood pressure monitor and would like his own if there if no out of pocket cost, he voiced appreciation for the HTN educational material that was sent to him and stated he had no questions about the material, he is requesting assistance with rescheduling his chest CT     Case Manager Clinical Goal(s):  [name] Over the next 30-60 days, patient will demonstrate improved health management independence as evidenced by checking blood pressure as directed and notifying PCP if SBP>180 or DBP > 100 taking all medications as prescribe, and adhering to a low sodium diet as discussed. . Patient will complete appointment for chest CT  Interventions:  . Evaluation of current treatment plan related to hypertension self management and patient's adherence to plan as established by provider. . Provided patient with the opportunity to ask questions about the educational material that was sent to him re: stroke prevention, s/s of heart attack and stroke, DASH diet, complications of  uncontrolled blood pressure-mailed "Blood Pressure Control" booklet,  [employer] HTN education articles entitled DASH Diet and High Blood Pressure Emergencies  . Reviewed medications with patient, assessed medication taking behavior and discussed importance of compliance  . Educated patient that high blood pressure often has no symptoms and advised patient, providing education and rationale, to monitor blood pressure 2-3 times per week and record the readings in the [place] [facility] spiral bound calendar that was mailed to him last month , and call PCP for findings outside established parameters.  . Reviewed his blood pressure reading at the clinic on 11/09/19 of 123/96 and reviewed targets for blood pressure, emphasizing that the diastolic pressure should be <90 . Informed patient of results of abdominal ultrasound completed on 12/03/19 . Per patient request, messaged CT schedulers requesting assistance with rescheduling lung CT that patient cancelled due to illness on 8/13 . Messaged Remi Haggard , clinic financial counselor , requesting assistance in determining the managed Medicaid Plan patient has in order to assist with securing a home BP monitor . [place] assistance at [phone] and determined that home BP monitor is covered  . Messaged provider to write prescription for home BP monitor and include HTN ICD 10 code, recommended frequency of home BP checks, BP parameters requiring provider notification and provider NPI # on the Rx . Discussed plans with patient for ongoing care management follow up and ensured he has  the contact number for the CCM team and clinic  Patient Self Care Activities:  . Self administers medications as prescribed . Attends all scheduled provider appointments . Calls provider office for new concerns, questions, or BP outside discussed parameters . Checks BP and records as discussed . Follows a low sodium diet/DASH  diet . Completes CT of lung appointment  Please see past updates related to this goal by clicking on the "Past Updates" button in the selected goal          The patient verbalized understanding of instructions provided today and declined a print copy of patient instruction materials.   The care management team will reach out to the patient again over the next 30-60 days.   Kelli Churn RN, CCM, [facility] RN Care Manager [phone]

## 2019-12-16 NOTE — Telephone Encounter (Signed)
°  Care Management   Outreach Note  12/16/2019 Name: Ethan [name] MRN: [id number] DOB: 12/23/1965  Referred by: Riesa Pope, MD Reason for referral : Care Coordination (HTN, prediabetes)   An unsuccessful telephone outreach was attempted today. The patient was referred to the case management team for assistance with care management and care coordination.   Follow Up Plan: A HIPPA compliant phone message was left for the patient providing contact information and requesting a return call.  If no return call from patient, the care management team will reach out to the patient again over the next 7-10 days.   Kelli Churn RN, CCM, [facility] RN Care Manager [phone]

## 2019-12-16 NOTE — Progress Notes (Signed)
Internal Medicine Clinic Attending  CCM services provided by the care management provider and their documentation were discussed with Dr. Agyei. We reviewed the pertinent findings, urgent action items addressed by the resident and non-urgent items to be addressed by the PCP.  I agree with the assessment, diagnosis, and plan of care documented in the CCM and resident's note.  Laymond Postle Thomas Grae Leathers, MD 12/16/2019  

## 2019-12-16 NOTE — Chronic Care Management (AMB) (Signed)
Care Management   Follow Up Note   12/16/2019 Name: Ethan [name] MRN: [id number] DOB: 1965/09/08  Referred by: Riesa Pope, MD Reason for referral : Care Coordination (HTN, prediabetes)   Ethan [name] is a 54 y.o. year old male who is a primary care patient of Katsadouros, Candace Gallus, MD. The care management team was consulted for assistance with care management and care coordination needs.    Review of patient status, including review of consultants reports, relevant laboratory and other test results, and collaboration with appropriate care team members and the patient's provider was performed as part of comprehensive patient evaluation and provision of chronic care management services.    SDOH (Social Determinants of Health) assessments performed: No See Care Plan activities for detailed interventions related to [facility])     Advanced Directives: See Care Plan and Vynca application for related entries.   Goals Addressed              This Visit's Progress     Patient Stated   .  "I check my blood pressure at home and take my medications like I am suppose to." (pt-stated)        [place] (see longitudinal plan of care for additional care plan information)  Objective:  . Last practice recorded BP readings:  BP Readings from Last 3 Encounters:  11/09/19 (!) 123/96  12/14/16 140/84  11/27/16 120/89 .   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 and follow up assessment completed, he states he has not checked his blood pressure at home because he has been taking his medicine and feels good and has not had "hot flashes" which he correlates to his blood pressure bing high, he says he uses some else's blood pressure monitor and would like his own if there if no out of pocket cost, he voiced appreciation for the HTN educational material that was sent to him  and stated he had no questions about the material, he is requesting assistance with rescheduling his chest CT     Case Manager Clinical Goal(s):  [name] Over the next 30-60 days, patient will demonstrate improved health management independence as evidenced by checking blood pressure as directed and notifying PCP if SBP>180 or DBP > 100 taking all medications as prescribe, and adhering to a low sodium diet as discussed. . Patient will complete appointment for chest CT  Interventions:  . Evaluation of current treatment plan related to hypertension self management and patient's adherence to plan as established by provider. . Provided patient with the opportunity to ask questions about the educational material that was sent to him re: stroke prevention, s/s of heart attack and stroke, DASH diet, complications of uncontrolled blood pressure-mailed "Blood Pressure Control" booklet,  [employer] HTN education articles entitled DASH Diet and High Blood Pressure Emergencies  . Reviewed medications with patient, assessed medication taking behavior and discussed importance of compliance  . Educated patient that high blood pressure often has no symptoms and advised patient, providing education and rationale, to monitor blood pressure 2-3 times per week and record the readings in the [place] [facility] spiral bound calendar that was mailed to him last month , and call PCP for findings outside established parameters.  . Reviewed his blood pressure reading at the clinic on 11/09/19 of 123/96 and reviewed targets for blood pressure, emphasizing that the diastolic pressure should be <  90 . Informed patient of results of abdominal ultrasound completed on 12/03/19 . Per patient request, messaged CT schedulers requesting assistance with rescheduling lung CT that patient cancelled due to illness on 8/13 . Messaged Remi Haggard , clinic financial counselor , requesting assistance in determining the managed Medicaid  Plan patient has in order to assist with securing a home BP monitor . [place] assistance at [phone] and determined that home BP monitor is covered  . Messaged provider to write prescription for home BP monitor and include HTN ICD 10 code, recommended frequency of home BP checks, BP parameters requiring provider notification and provider NPI # on the Rx . Discussed plans with patient for ongoing care management follow up and ensured he has the contact number for the CCM team and clinic  Patient Self Care Activities:  . Self administers medications as prescribed . Attends all scheduled provider appointments . Calls provider office for new concerns, questions, or BP outside discussed parameters . Checks BP and records as discussed . Follows a low sodium diet/DASH diet . Completes CT of lung appointment  Please see past updates related to this goal by clicking on the "Past Updates" button in the selected goal           The care management team will reach out to the patient again over the next 30-60 days.   Kelli Churn RN, CCM, [place] RN Care Manager [phone]

## 2019-12-16 NOTE — Progress Notes (Signed)
Internal Medicine Clinic Resident  I have personally reviewed this encounter including the documentation in this note and/or discussed this patient with the care management provider. I will address any urgent items identified by the care management provider and will communicate my actions to the patient's PCP. I have reviewed the patient's CCM visit with my supervising attending, Dr Vincent.  Ethan Foss K Shiara Mcgough, MD 12/16/2019   

## 2019-12-17 MED ORDER — BLOOD PRESSURE KIT DEVI
0 refills | Status: DC
Start: 2019-12-17 — End: 2020-10-23

## 2019-12-17 NOTE — Progress Notes (Signed)
Internal Medicine Clinic Attending  CCM services provided by the care management provider and their documentation were discussed with Dr. Eileen Stanford. We reviewed the pertinent findings, urgent action items addressed by the resident and non-urgent items to be addressed by the PCP.  I agree with the assessment, diagnosis, and plan of care documented in the CCM and resident's note.  Axel Filler, MD 12/17/2019

## 2019-12-17 NOTE — Progress Notes (Signed)
Internal Medicine Clinic Resident  I have personally reviewed this encounter including the documentation in this note and/or discussed this patient with the care management provider. I will address any urgent items identified by the care management provider and will communicate my actions to the patient's PCP. I have reviewed the patient's CCM visit with my supervising attending, Dr Evette Doffing.  Jean Rosenthal, MD 12/17/2019

## 2019-12-17 NOTE — Addendum Note (Signed)
Addended by: Jean Rosenthal on: 12/17/2019 07:41 AM   Modules accepted: Orders

## 2019-12-22 ENCOUNTER — Telehealth: Payer: Medicaid Other

## 2019-12-23 ENCOUNTER — Ambulatory Visit: Payer: Medicaid Other | Admitting: *Deleted

## 2019-12-23 DIAGNOSIS — I10 Essential (primary) hypertension: Secondary | ICD-10-CM

## 2019-12-23 DIAGNOSIS — Z87898 Personal history of other specified conditions: Secondary | ICD-10-CM

## 2019-12-23 DIAGNOSIS — F17200 Nicotine dependence, unspecified, uncomplicated: Secondary | ICD-10-CM

## 2019-12-23 NOTE — Chronic Care Management (AMB) (Signed)
Care Management   Follow Up Note   12/23/2019 Name: Ethan [name] MRN: [id number] DOB: 04-20-1965  Referred by: Ethan Pope, MD Reason for referral : Care Coordination (HTN, prediabetes, tobacco use)   Ethan [name] is a 54 y.o. year old male who is a primary care patient of Katsadouros, Candace Gallus, MD. The care management team was consulted for assistance with care management and care coordination needs.    Review of patient status, including review of consultants reports, relevant laboratory and other test results, and collaboration with appropriate care team members and the patient's provider was performed as part of comprehensive patient evaluation and provision of chronic care management services.    SDOH (Social Determinants of Health) assessments performed: No See Care Plan activities for detailed interventions related to [facility])     Advanced Directives: See Care Plan and Vynca application for related entries.   Goals Addressed              This Visit's Progress     Patient Stated   .  "I check my blood pressure at home and take my medications like I am suppose to." (pt-stated)        [name] (see longitudinal plan of care for additional care plan information)  Objective:  . Last practice recorded BP readings:  BP Readings from Last 3 Encounters:  11/09/19 (!) 123/96  12/14/16 140/84  11/27/16 120/89 .   [name] Most recent eGFR/CrCl: No results found for: EGFR  No components found for: CRCL  Current Barriers:  [name] Knowledge Deficits related to basic understanding of hypertension pathophysiology and self care management-  spoke with patient via phone to follow up on home BP monitor and rescheduling chest CT , patient voiced correct date and time for CT of chest and confirmed he has transportation. Patient voiced appreciation to this CCM RN for obtaining home blood pressure monitor  . -Case Manager Clinical Goal(s):  [name] Over the next 30-60 days, patient  will demonstrate improved health management independence as evidenced by checking blood pressure as directed and notifying PCP if SBP>180 or DBP > 100 taking all medications as prescribe, and adhering to a low sodium diet as discussed. . Patient will complete appointment for chest CT  Interventions:  . Took electronic Rx for home blood pressure monitor and copy of patient's Medicaid card to [employer] and requested monitor be delivered to patient's home tomorrow afternoon  . Advised patient that blood pressure monitor will be delivered to his home address tomorrow afternoon by [employer]. . Encouraged patient to contact this CCM RN for questions or  concerns about BP self monitoring. . Ensured CT of chest rescheduled and that patient has transportation  to the appointment on 12/27/19 . Discussed plans with patient for ongoing care management follow up and ensured he has the contact number for the CCM team and clinic  Patient Self Care Activities:  . Self administers medications as prescribed . Attends all scheduled provider appointments . Calls provider office for new concerns, questions, or BP outside discussed parameters . Checks BP and records as discussed . Follows a low sodium diet/DASH diet . Completes CT of lung appointment  Please see past updates related to this goal by clicking on the "Past Updates" button in the selected goal           The care management team will reach out to the patient again over the next 30-60 days.   Kelli Churn RN, CCM, [place]  [facility] RN Care Manager [phone]

## 2019-12-29 ENCOUNTER — Telehealth: Payer: Self-pay

## 2019-12-29 ENCOUNTER — Telehealth: Payer: Medicaid Other

## 2019-12-29 NOTE — Telephone Encounter (Signed)
  Chronic Care Management   Outreach Note  12/29/2019 Name: Ethan [name] MRN: [id number] DOB: June 02, 1965  Referred by: Riesa Pope, MD Reason for referral : Care Coordination (housing resources)   An unsuccessful telephone outreach was attempted today. The patient was referred to the case management team for assistance with care management and care coordination.   Follow Up Plan: A HIPAA compliant phone message was left for the patient providing contact information and requesting a return call.       Ronn Melena, [place] Coordination Social Worker [place] [phone]

## 2019-12-31 ENCOUNTER — Ambulatory Visit (HOSPITAL_COMMUNITY)
Admission: RE | Admit: 2019-12-31 | Discharge: 2019-12-31 | Disposition: A | Discharge status: Home / Self Care | Payer: Medicaid Other | Source: Ambulatory Visit | Attending: Internal Medicine | Admitting: Internal Medicine

## 2019-12-31 ENCOUNTER — Other Ambulatory Visit: Payer: Self-pay

## 2019-12-31 DIAGNOSIS — R634 Abnormal weight loss: Secondary | ICD-10-CM

## 2020-01-03 ENCOUNTER — Ambulatory Visit: Payer: Medicaid Other

## 2020-01-03 DIAGNOSIS — R634 Abnormal weight loss: Secondary | ICD-10-CM

## 2020-01-03 DIAGNOSIS — F259 Schizoaffective disorder, unspecified: Secondary | ICD-10-CM

## 2020-01-03 DIAGNOSIS — I10 Essential (primary) hypertension: Secondary | ICD-10-CM
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
## 2020-01-07 ENCOUNTER — Ambulatory Visit (HOSPITAL_COMMUNITY): Payer: Medicaid Other

## 2020-01-18 ENCOUNTER — Other Ambulatory Visit: Payer: Self-pay

## 2020-01-18 ENCOUNTER — Ambulatory Visit (HOSPITAL_COMMUNITY): Admission: RE | Admit: 2020-01-18 | Payer: Medicaid Other | Source: Ambulatory Visit

## 2020-01-18 ENCOUNTER — Encounter (HOSPITAL_COMMUNITY): Payer: Self-pay

## 2020-01-20 ENCOUNTER — Ambulatory Visit: Payer: Medicaid Other | Admitting: *Deleted

## 2020-01-20 ENCOUNTER — Telehealth: Payer: Self-pay | Admitting: *Deleted

## 2020-01-20 ENCOUNTER — Telehealth: Payer: Medicaid Other

## 2020-01-20 DIAGNOSIS — I10 Essential (primary) hypertension: Secondary | ICD-10-CM

## 2020-01-20 DIAGNOSIS — F259 Schizoaffective disorder, unspecified: Secondary | ICD-10-CM

## 2020-01-20 NOTE — Telephone Encounter (Signed)
  Care Management   Outreach Note  01/20/2020 Name: Ethan [name] MRN: [id number] DOB: 1965/08/28  Referred by: Riesa Pope, MD Reason for referral : Care Coordination (HTN, prediabetes, smokes)   An unsuccessful telephone outreach was attempted today to complete follow up assessment. The patient was referred to the case management team for assistance with care management and care coordination.   Follow Up Plan: A HIPAA compliant phone message was left for the patient providing contact information and requesting a return call.  The care management team will reach out to the patient again over the next 7-10 days.   Kelli Churn RN, CCM, [facility] RN Care Manager [phone]

## 2020-01-20 NOTE — Chronic Care Management (AMB) (Signed)
Care Management   Follow Up Note   01/20/2020 Name: Ethan [name] MRN: [id number] DOB: 1966-01-28  Referred by: Riesa Pope, MD Reason for referral : Care Coordination (HTN, prediabetes, smokes)   PANKAJ HAACK is a 54 y.o. year old male who is a primary care patient of Katsadouros, Candace Gallus, MD. The care management team was consulted for assistance with care management and care coordination needs.    Review of patient status, including review of consultants reports, relevant laboratory and other test results, and collaboration with appropriate care team members and the patient's provider was performed as part of comprehensive patient evaluation and provision of chronic care management services.    SDOH (Social Determinants of Health) assessments performed: No See Care Plan activities for detailed interventions related to [facility])     Advanced Directives: See Care Plan and Vynca application for related entries.   Goals Addressed              This Visit's Progress     Patient Stated   .  "I check my blood pressure at home and take my medications like I am suppose to." (pt-stated)        [place] (see longitudinal plan of care for additional care plan information)  Objective:  . Last practice recorded BP readings:  BP Readings from Last 3 Encounters:  11/09/19 (!) 123/96  12/14/16 140/84  11/27/16 120/89 .   [name] Most recent eGFR/CrCl: No results found for: EGFR  No components found for: CRCL  Current Barriers:  [name] Knowledge Deficits related to basic understanding of hypertension pathophysiology and self care management-  spoke with patient to assess self monitored blood pressures, he recalls last 2 readings as 130/76, 150/84, reports good medication taking behavior and denies difficulty in getting medications, he voices frustration that he has paid for two $10 round trip transports to [facility] to have scheduled CT of chest only to be told they did  not have a signed order and could not do the CT, he said the second time it happened he was actually waiting in the CT machine. He is requesting assurance that the order is signed prior to the next scheduled CT of chest appointment  . -Case Manager Clinical Goal(s):  [name] Over the next 30-60 days, patient will demonstrate improved health management independence as evidenced by checking blood pressure as directed and notifying PCP if SBP>180 or DBP > 100 taking all medications as prescribe, and adhering to a low sodium diet as discussed. . Patient will complete appointment for chest CT  Interventions:  . Evaluation of current treatment plan related to HTN and patient's adherence to plan as established by provider. . Reviewed medications with patient and discussed medication taking behavior . Reviewed home blood pressure readings and reviewed blood pressure targets . Positive reinforcement given to patient for home BP self monitoring . Encouraged patient to contact this CCM RN for questions or  concerns about BP self monitoring. . Ensured chest CT  rescheduled and that patient has transportation to appointment on 01/28/20 . Advised patient to call CT department at [facility] the day before the test to verify the order has been signed . Advised patient this CCM RN will send message to provider asking for assurance that the order is signed . Discussed plans with patient for ongoing care management follow up and ensured he has the contact number for the CCM team and clinic  Patient Self Care Activities:  . Self  administers medications as prescribed . Attends all scheduled provider appointments . Calls provider office for new concerns, questions, or BP outside discussed parameters . Checks BP and records as discussed . Follows a low sodium diet/DASH diet . Completes CT of lung appointment  Please see past updates related to this goal by clicking on the "Past Updates" button in the selected  goal           The care management team will reach out to the patient again over the next 30-60 days.   Kelli Churn RN, CCM, [facility] RN Care Manager [phone]

## 2020-01-20 NOTE — Progress Notes (Signed)
Internal Medicine Clinic Resident  Spoke with Mr.Claire and reassured him that chest CT order had been signed.  I have personally reviewed this encounter including the documentation in this note and/or discussed this patient with the care management provider. I will address any urgent items identified by the care management provider and will communicate my actions to the patient's PCP. I have reviewed the patient's CCM visit with my supervising attending, Dr Evette Doffing.  Mosetta Anis, MD 01/20/2020

## 2020-01-20 NOTE — Patient Instructions (Signed)
Visit Information It was nice speaking with you today. Keep monitoring your blood pressure at home as you are doing.   Goals Addressed              This Visit's Progress     Patient Stated   .  "I check my blood pressure at home and take my medications like I am suppose to." (pt-stated)        [place] (see longitudinal plan of care for additional care plan information)  Objective:  . Last practice recorded BP readings:  BP Readings from Last 3 Encounters:  11/09/19 (!) 123/96  12/14/16 140/84  11/27/16 120/89 .   [name] Most recent eGFR/CrCl: No results found for: EGFR  No components found for: CRCL  Current Barriers:  [name] Knowledge Deficits related to basic understanding of hypertension pathophysiology and self care management-  spoke with patient to assess self monitored blood pressures, he recalls last 2 readings as 130/76, 150/84, reports good medication taking behavior and denies difficulty in getting medications, he voices frustration that he has paid for two $10 round trip transport to [facility] to have scheduled CT of chest only to be told they did not have a signed order and could not do the CT, he said the second time it happened he was actually waiting in the CT machine. He is requesting assurance that the order is signed prior to the next scheduled CT of chest appointment  . -Case Manager Clinical Goal(s):  [name] Over the next 30-60 days, patient will demonstrate improved health management independence as evidenced by checking blood pressure as directed and notifying PCP if SBP>180 or DBP > 100 taking all medications as prescribe, and adhering to a low sodium diet as discussed. . Patient will complete appointment for chest CT  Interventions:  . Evaluation of current treatment plan related to HTN and patient's adherence to plan as established by provider. . Reviewed medications with patient and discussed medication taking behavior . Reviewed home blood pressure  readings and reviewed blood pressure targets . Positive reinforcement given to patient for home BP self monitoring . Encouraged patient to contact this CCM RN for questions or  concerns about BP self monitoring. . Ensured chest CT  rescheduled and that patient has transportation to appointment on 01/28/20 . Advised patient to call CT department at [facility] the day before the test to verify the order has been signed . Advised patient this CCM RN will send message to provider asking for assurance that the order is signed . Discussed plans with patient for ongoing care management follow up and ensured he has the contact number for the CCM team and clinic  Patient Self Care Activities:  . Self administers medications as prescribed . Attends all scheduled provider appointments . Calls provider office for new concerns, questions, or BP outside discussed parameters . Checks BP and records as discussed . Follows a low sodium diet/DASH diet . Completes CT of lung appointment  Please see past updates related to this goal by clicking on the "Past Updates" button in the selected goal          The patient verbalized understanding of instructions provided today and declined a print copy of patient instruction materials.   The care management team will reach out to the patient again over the next 30-60 days.   Kelli Churn RN, CCM, [facility] RN Care Manager [phone]

## 2020-01-27 ENCOUNTER — Telehealth: Payer: Medicaid Other

## 2020-01-28 ENCOUNTER — Encounter (HOSPITAL_COMMUNITY): Payer: Self-pay

## 2020-01-28 ENCOUNTER — Ambulatory Visit (HOSPITAL_COMMUNITY): Payer: Medicaid Other

## 2020-01-31 ENCOUNTER — Ambulatory Visit: Payer: Medicaid Other

## 2020-01-31 ENCOUNTER — Telehealth: Payer: Self-pay

## 2020-01-31 DIAGNOSIS — F259 Schizoaffective disorder, unspecified: Secondary | ICD-10-CM

## 2020-01-31 DIAGNOSIS — I10 Essential (primary) hypertension: Secondary | ICD-10-CM

## 2020-01-31 NOTE — Patient Instructions (Signed)
Visit Information  Goals Addressed              This Visit's Progress   .  "I need to find a CNA and somebody to manage my money because my payee doesn't want to deal with me anymore" (pt-stated)        [place] (see longitudinal plan of care for additional care plan information)  Current Barriers:  . Patient states that current payee is no longer willing to manage his care or assist with day to day activities as needed.  Patient is seeking PCS and new Representative Payee. Patient states that he has been in touch with his Medicaid Caseworker at [place] regarding need for payee.  Per patient, he was told that he must first locate someone to designate as payee then contact [place].  Patient states that there is no one he trusts enough to manage his money.    Clinical Social Work Clinical Goal(s):  [name] Over the next 120 days, patient will work with SW to address concerns related to need for new payee and PCS  Interventions: . Inter-disciplinary care team collaboration (see longitudinal plan of care) . Provide patient with phone number for Adult Representative Payee Services at [place] . Encouraged patient to call and inquire about process for obtaining payee since he does not have anyone to do so . Educated patient about referral and assessment process for [place] . Informed patient that he must require assistance with at least three ADL's to qualify for PCS . Per patient request, agreed to assist with process to get patient assessed for services   Patient Self Care Activities:  . Patient verbalizes understanding of plan to work with BSW to be assessed for PCS . Self administers medications as prescribed . Calls provider office for new concerns or questions . Unable to perform ADLs independently . Unable to perform IADLs independently  Initial goal documentation        Patient verbalizes understanding  of instructions provided today.    The patient has been provided with contact information for the care management team and has been advised to call with any health related questions or concerns.   Will follow up with patient when assessment for PCS can be scheduled.    Ronn Melena, [place] Coordination Social Worker [place] [phone]

## 2020-01-31 NOTE — Telephone Encounter (Signed)
Patient has CCM. Will forward to CCM Team. Hubbard Hartshorn, BSN, RN-BC

## 2020-01-31 NOTE — Telephone Encounter (Signed)
Requesting nursing assistant at home, please call pt back.

## 2020-01-31 NOTE — Chronic Care Management (AMB) (Signed)
  Care Management   Follow Up Note   01/31/2020 Name: Ethan [name] MRN: [id number] DOB: 21-Feb-1966  Referred by: Riesa Pope, MD Reason for referral : Care Coordination (PCS, Representative Payee Services)   Ethan [name] is a 55 y.o. year old male who is a primary care patient of Katsadouros, Candace Gallus, MD. The care management team was consulted for assistance with care management and care coordination needs.    Review of patient status, including review of consultants reports, relevant laboratory and other test results, and collaboration with appropriate care team members and the patient's provider was performed as part of comprehensive patient evaluation and provision of chronic care management services.    SDOH (Social Determinants of Health) assessments performed: No See Care Plan activities for detailed interventions related to [facility])     Advanced Directives: See Care Plan and Vynca application for related entries.   Goals Addressed              This Visit's Progress   .  "I need to find a CNA and somebody to manage my money because my payee doesn't want to deal with me anymore" (pt-stated)        [place] (see longitudinal plan of care for additional care plan information)  Current Barriers:  . Patient states that current payee is no longer willing to manage his care or assist with day to day activities as needed.  Patient is seeking PCS and new Representative Payee. Patient states that he has been in touch with his Medicaid Caseworker at [place] regarding need for payee.  Per patient, he was told that he must first locate someone to designate as payee then contact [place].  Patient states that there is no one he trusts enough to manage his money.    Clinical Social Work Clinical Goal(s):  [name] Over the next 120 days, patient will work with SW to address concerns related to need for new payee and  PCS  Interventions: . Inter-disciplinary care team collaboration (see longitudinal plan of care) . Provide patient with phone number for Adult Representative Payee Services at [place] . Encouraged patient to call and inquire about process for obtaining payee since he does not have anyone to do so . Educated patient about referral and assessment process for [place] . Informed patient that he must require assistance with at least three ADL's to qualify for PCS . Per patient request, agreed to assist with process to get patient assessed for services   Patient Self Care Activities:  . Patient verbalizes understanding of plan to work with BSW to be assessed for PCS . Self administers medications as prescribed . Calls provider office for new concerns or questions . Unable to perform ADLs independently . Unable to perform IADLs independently  Initial goal documentation         The patient has been provided with contact information for the care management team and has been advised to call with any health related questions or concerns.   Will follow up with patient when assessment for PCS can be scheduled.    Ronn Melena, [place] Coordination Social Worker [place] [phone]

## 2020-02-01 ENCOUNTER — Ambulatory Visit: Payer: Medicaid Other

## 2020-02-01 DIAGNOSIS — I10 Essential (primary) hypertension: Secondary | ICD-10-CM

## 2020-02-01 NOTE — Patient Instructions (Signed)
Visit Information  Goals Addressed              This Visit's Progress   .  "I need to find a CNA and somebody to manage my money because my payee doesn't want to deal with me anymore" (pt-stated)        [name] (see longitudinal plan of care for additional care plan information)  Current Barriers:  . Patient states that current payee is no longer willing to manage his care or assist with day to day activities as needed.  Patient is seeking PCS and new Representative Payee. Patient states that he has been in touch with his Medicaid Caseworker at [place] regarding need for payee.  Per patient, he was told that he must first locate someone to designate as payee then contact [place].  Patient states that there is no one he trusts enough to manage his money.    Clinical Social Work Clinical Goal(s):  [name] Over the next 120 days, patient will work with SW to address concerns related to need for new payee and PCS  Interventions: . Inter-disciplinary care team collaboration (see longitudinal plan of care) . Collaborated with Dr. Gilberto Better regarding patients request for assistance with PCS . Contacted patient to inform him that office visit is needed since he has not been seen since July . Assisted with scheduling of appointment . 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 Patient Self Care Activities:  . Patient verbalizes understanding of plan to work with BSW to be assessed for PCS . Self administers medications as prescribed . Calls provider office for new concerns or questions . Unable to perform ADLs independently . Unable to perform IADLs independently  Please see past updates related to this goal by clicking on the "Past Updates" button in the selected goal         Patient verbalizes understanding of instructions provided today.    Patient scheduled for office visit on 02/14/20.  Can assist  with request for [name] after this appointment.       Ronn Melena, [place] Coordination Social Worker [place] [phone]

## 2020-02-01 NOTE — Chronic Care Management (AMB) (Signed)
  Care Management   Follow Up Note   02/01/2020 Name: Ethan [name] MRN: [id number] DOB: 02/20/1966  Referred by: Riesa Pope, MD Reason for referral : Care Coordination (PCS)   Ethan [name] is a 54 y.o. year old male who is a primary care patient of Katsadouros, Candace Gallus, MD. The care management team was consulted for assistance with care management and care coordination needs.    Review of patient status, including review of consultants reports, relevant laboratory and other test results, and collaboration with appropriate care team members and the patient's provider was performed as part of comprehensive patient evaluation and provision of chronic care management services.    SDOH (Social Determinants of Health) assessments performed: No See Care Plan activities for detailed interventions related to [facility])     Advanced Directives: See Care Plan and Vynca application for related entries.   Goals Addressed              This Visit's Progress   .  "I need to find a CNA and somebody to manage my money because my payee doesn't want to deal with me anymore" (pt-stated)        [place] (see longitudinal plan of care for additional care plan information)  Current Barriers:  . Patient states that current payee is no longer willing to manage his care or assist with day to day activities as needed.  Patient is seeking PCS and new Representative Payee. Patient states that he has been in touch with his Medicaid Caseworker at [facility] regarding need for payee.  Per patient, he was told that he must first locate someone to designate as payee then contact [place].  Patient states that there is no one he trusts enough to manage his money.    Clinical Social Work Clinical Goal(s):  [name] Over the next 120 days, patient will work with SW to address concerns related to need for new payee and PCS  Interventions: . Inter-disciplinary  care team collaboration (see longitudinal plan of care) . Collaborated with Dr. Gilberto Better regarding patients request for assistance with PCS . Contacted patient to inform him that office visit is needed since he has not been seen since July . Assisted with scheduling of appointment . 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 Patient Self Care Activities:  . Patient verbalizes understanding of plan to work with BSW to be assessed for PCS . Self administers medications as prescribed . Calls provider office for new concerns or questions . Unable to perform ADLs independently . Unable to perform IADLs independently  Please see past updates related to this goal by clicking on the "Past Updates" button in the selected goal          Patient scheduled for office visit on 02/14/20.  Can assist with request for [place] after this appointment.       Ronn Melena, [place] Coordination Social Worker [place] [phone]

## 2020-02-08 ENCOUNTER — Telehealth: Payer: Self-pay

## 2020-02-08 NOTE — Telephone Encounter (Signed)
pls contact yvette regarding some questions [phone]

## 2020-02-08 NOTE — Telephone Encounter (Signed)
If no improvement with imodium, would have him evaluated sooner. Otherwise, can follow up with PCP on 11/1. Thank you!

## 2020-02-08 NOTE — Telephone Encounter (Signed)
Pt called / informed if imodium does not help w/diarreha to call the office back to scheduled a sooner appt. Voiced understanding.

## 2020-02-08 NOTE — Telephone Encounter (Signed)
Return call to pt - stated he's having 10 - 15 BM's daily along with weight loss. Wants to know what he can take?  Informed to try Imodium OTC and I let inform his doctor. I asked if he's staying hydrated - stated he drinks water, beer, and tea. Also I asked if his doctor is aware; he stated yes. He does have an upcoming appt on Nov 1.

## 2020-02-14 ENCOUNTER — Other Ambulatory Visit: Payer: Self-pay

## 2020-02-14 ENCOUNTER — Ambulatory Visit: Payer: Medicaid Other | Admitting: *Deleted

## 2020-02-14 ENCOUNTER — Encounter: Payer: Self-pay | Admitting: Student

## 2020-02-14 ENCOUNTER — Ambulatory Visit: Payer: Medicaid Other | Admitting: Student

## 2020-02-14 DIAGNOSIS — F418 Other specified anxiety disorders: Secondary | ICD-10-CM | POA: Diagnosis not present

## 2020-02-14 DIAGNOSIS — F259 Schizoaffective disorder, unspecified: Secondary | ICD-10-CM

## 2020-02-14 DIAGNOSIS — Z0000 Encounter for general adult medical examination without abnormal findings: Secondary | ICD-10-CM | POA: Diagnosis present

## 2020-02-14 DIAGNOSIS — Z87898 Personal history of other specified conditions: Secondary | ICD-10-CM

## 2020-02-14 DIAGNOSIS — I10 Essential (primary) hypertension: Secondary | ICD-10-CM

## 2020-02-14 DIAGNOSIS — F430 Acute stress reaction: Secondary | ICD-10-CM

## 2020-02-14 DIAGNOSIS — H548 Legal blindness, as defined in USA: Secondary | ICD-10-CM | POA: Diagnosis not present
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
## 2020-02-14 NOTE — Patient Instructions (Signed)
Thank you, [name] for allowing [place] to provide your care today. Today we discussed your current socials situation. Continue to work with Ethan [name] and call the clinic if you need [place]. Please continue to take your medications. If you feel as though you are going to harm yourself or others, please call 911.     I have ordered the following labs for you:  Lab Orders  No laboratory test(s) ordered today     Tests ordered today: None  Referrals ordered today:   Referral Orders  No referral(s) requested today     I have ordered the following medication/changed the following medications:   Stop the following medications: There are no discontinued medications.   Start the following medications: No orders of the defined types were placed in this encounter.    Follow up: 2 months    Remember: Call us if you need [place] and if you have any thoughts of harming yourself, call 911.  Should you have any questions or concerns please call the internal medicine clinic at [phone].     Ethan [name], D.O. [place]

## 2020-02-14 NOTE — Progress Notes (Signed)
 CC: Anxiety, Legal Blindness  HPI:  [name] is a 54 y.o. male with a past medical history stated below and presents today for anxiety secondary to social situations that are difficult due to his blindness. Please see problem based assessment and plan for additional details.  Past Medical History:  Diagnosis Date  . Alcoholism (HCC)   . Anxiety   . Depression   . GERD (gastroesophageal reflux disease)   . Heart murmur   . Hepatitis C    Treated in prison for 18 months.  Sounds like Ribaviron and Interferon  2009-2011  . Hypertension   . Peptic ulcer   . Substance abuse (HCC)   . Urge urinary incontinence    07/20/2015:  evaluated by Dr. Mark Ottelin, Urodynamics planned    Current Outpatient Medications on File Prior to Visit  Medication Sig Dispense Refill  . amLODipine (NORVASC) 5 MG tablet Take 1 tablet (5 mg total) by mouth daily. 90 tablet 1  . Blood Pressure Monitoring (BLOOD PRESSURE KIT) DEVI Measure blood pressure twice a day 1 each 0  . hydrOXYzine (ATARAX/VISTARIL) 25 MG tablet Take 1 tablet (25 mg total) by mouth every 6 (six) hours as needed for anxiety. 30 tablet 0  . nicotine (NICODERM CQ - DOSED IN MG/24 HOURS) 21 mg/24hr patch Place 1 patch (21 mg total) onto the skin daily. (Patient not taking: Reported on 11/15/2019) 28 patch 0  . pantoprazole (PROTONIX) 40 MG tablet Take 1 tablet (40 mg total) by mouth daily. 90 tablet 1  . risperiDONE (RISPERDAL) 0.5 MG tablet Take 1 tablet (0.5 mg total) by mouth 2 (two) times daily. 60 tablet 0  . traZODone (DESYREL) 50 MG tablet Take 1 tablet (50 mg total) by mouth at bedtime and may repeat dose one time if needed. 30 tablet 0  . valsartan-hydrochlorothiazide (DIOVAN-HCT) 160-12.5 MG tablet Take 1 tablet by mouth daily. 90 tablet 1   No current facility-administered medications on file prior to visit.    Family History  Problem Relation Age of Onset  . Breast cancer Sister   . Breast cancer Maternal  Grandmother   . Colon polyps Brother   . Diabetes Sister        x 2  . Diabetes Maternal Aunt   . Diabetes Paternal Aunt   . Diabetes Mother        DM complications cause of death  . Alcohol abuse Father   . COPD Neg Hx     Social History   Socioeconomic History  . Marital status: Significant Other    Spouse name: Not on file  . Number of children: 3  . Years of education: GED  . Highest education level: Not on file  Occupational History  . 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 [employer]  Tobacco Use  . Smoking status: Current Every Day Smoker    Packs/day: 0.50    Years: 12.00    Pack years: 6.00    Types: Cigarettes    Start date: 07/14/1976  . Smokeless tobacco: Never Used  . Tobacco comment: Stopped for 16 years.  Restarted with bout of depression/anxiety recently  Substance and Sexual Activity  . Alcohol use: Yes    Alcohol/week: 0.0 standard drinks    Comment: 3 beers nightly  . Drug use: Yes    Types: "Crack" cocaine    Comment: Occassionally MJ currently--in past crack cocaine--more than 16 years ago.  . Sexual activity: Yes      Partners: Female    Birth control/protection: None    Comment: WIFE USED MAJUANA SATURDAY  Other Topics Concern  . Not on file  Social History Narrative   Lost job and schooling secondary to "past"   Apparently for what he was incarcerated for.   Social Determinants of Health   Financial Resource Strain: High Risk  . Difficulty of Paying Living Expenses: Hard  Food Insecurity: Food Insecurity Present  . Worried About Running Out of Food in the Last Year: Sometimes true  . Ran Out of Food in the Last Year: Sometimes true  Transportation Needs: No Transportation Needs  . Lack of Transportation (Medical): No  . Lack of Transportation (Non-Medical): No  Physical Activity: Insufficiently Active  . Days of Exercise per Week: 1 day  . Minutes of Exercise per Session: 30 min  Stress:   . Feeling of  Stress : Not on file  Social Connections: Unknown  . Frequency of Communication with Friends and Family: More than three times a week  . Frequency of Social Gatherings with Friends and Family: More than three times a week  . Attends Religious Services: Not on file  . Active Member of Clubs or Organizations: Not on file  . Attends Club or Organization Meetings: Not on file  . Marital Status: Not on file  Intimate Partner Violence:   . Fear of Current or Ex-Partner: Not on file  . Emotionally Abused: Not on file  . Physically Abused: Not on file  . Sexually Abused: Not on file    Review of Systems: ROS negative except for what is noted on the assessment and plan.  Vitals:   02/14/20 1559 02/14/20 1610  BP: (!) 151/96 (!) 167/103  Pulse: 100 97  Temp: 98.6 F (37 C)   TempSrc: Oral   SpO2: 100%   Weight: 161 lb (73 kg)   Height: 5' 9" (1.753 m)      Physical Exam: Physical Exam Vitals and nursing note reviewed.  HENT:     Head: Normocephalic and atraumatic.  Cardiovascular:     Rate and Rhythm: Normal rate.  Pulmonary:     Effort: Pulmonary effort is normal. No respiratory distress.  Neurological:     General: No focal deficit present.     Mental Status: He is alert and oriented to person, place, and time. Mental status is at baseline.  Psychiatric:     Comments: Minimal distress noted when patient discussing his social situation. Denies suicidal or homicidal ideation      Assessment & Plan:   See Encounters Tab for problem based charting.  Patient seen and discussed with Dr. E. Hoffman  Vasili , D.O.  Internal Medicine, PGY-2 Pager: [phone], Phone: [phone] Date 02/14/2020 Time 5:24 PM 
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
## 2020-02-15 ENCOUNTER — Ambulatory Visit: Payer: Medicaid Other

## 2020-02-15 DIAGNOSIS — Z87898 Personal history of other specified conditions: Secondary | ICD-10-CM

## 2020-02-15 DIAGNOSIS — F259 Schizoaffective disorder, unspecified: Secondary | ICD-10-CM

## 2020-02-15 NOTE — Progress Notes (Signed)
Internal Medicine Attestation: This CCM encounter has been discussed with resident physician, reviewed, and approved. Garrie Elenes, MD  

## 2020-02-15 NOTE — Progress Notes (Signed)
Internal Medicine Clinic Resident  I have personally reviewed this encounter including the documentation in this note and/or discussed this patient with the care management provider. I will address any urgent items identified by the care management provider and will communicate my actions to the patient's PCP. I have reviewed the patient's CCM visit with my supervising attending, Dr Williams.  Anistyn Graddy M Ezra Denne, MD 02/15/2020    
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
## 2020-02-16 NOTE — Progress Notes (Signed)
Internal Medicine Clinic Resident  I have personally reviewed this encounter including the documentation in this note and/or discussed this patient with the care management provider. I will address any urgent items identified by the care management provider and will communicate my actions to the patient's PCP. I have reviewed the patient's CCM visit with my supervising attending, Dr Hoffman.  Juanell Saffo M Mary-Anne Polizzi, MD 02/16/2020    

## 2020-02-17 ENCOUNTER — Ambulatory Visit: Payer: Medicaid Other

## 2020-02-17 DIAGNOSIS — I10 Essential (primary) hypertension: Secondary | ICD-10-CM

## 2020-02-17 DIAGNOSIS — F259 Schizoaffective disorder, unspecified: Secondary | ICD-10-CM

## 2020-02-17 DIAGNOSIS — F17200 Nicotine dependence, unspecified, uncomplicated: Secondary | ICD-10-CM

## 2020-02-17 NOTE — Progress Notes (Signed)
Internal Medicine Clinic Resident  I have personally reviewed this encounter including the documentation in this note and/or discussed this patient with the care management provider. I will address any urgent items identified by the care management provider and will communicate my actions to the patient's PCP. I have reviewed the patient's CCM visit with my supervising attending, Dr Heber Kane.  [place], DO 02/17/2020
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
## 2020-02-18 ENCOUNTER — Ambulatory Visit: Payer: Medicaid Other

## 2020-02-18 DIAGNOSIS — F17200 Nicotine dependence, unspecified, uncomplicated: Secondary | ICD-10-CM

## 2020-02-18 DIAGNOSIS — I10 Essential (primary) hypertension: Secondary | ICD-10-CM

## 2020-02-18 DIAGNOSIS — F250 Schizoaffective disorder, bipolar type: Secondary | ICD-10-CM
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
## 2020-02-18 NOTE — Progress Notes (Signed)
Internal Medicine Clinic Resident  I have personally reviewed this encounter including the documentation in this note and/or discussed this patient with the care management provider. I will address any urgent items identified by the care management provider and will communicate my actions to the patient's PCP. I have reviewed the patient's CCM visit with my supervising attending, Dr Heber [place].  [place], DO 02/18/2020
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
## 2020-02-22 ENCOUNTER — Telehealth: Payer: Medicaid Other

## 2020-02-22 NOTE — Progress Notes (Signed)
Internal Medicine Clinic Attending  I saw and evaluated the patient.  I personally confirmed the key portions of the history and exam documented by Dr. Katsadouros and I reviewed pertinent patient test results.  The assessment, diagnosis, and plan were formulated together and I agree with the documentation in the resident's note.  

## 2020-02-23 ENCOUNTER — Telehealth: Payer: Self-pay

## 2020-02-23 ENCOUNTER — Telehealth: Payer: Medicaid Other

## 2020-02-23 NOTE — Telephone Encounter (Signed)
  Chronic Care Management   Outreach Note  02/23/2020 Name: JOURNEE KOHEN MRN: [id number] DOB: [birth year]  Referred by: Riesa Pope, MD Reason for referral : Care Coordination (PCS, placement, payee)   An unsuccessful telephone outreach was attempted today. The patient was referred to the case management team for assistance with care management and care coordination.   Follow Up Plan: A HIPAA compliant phone message was left for the patient providing contact information and requesting a return call.       Ronn Melena, [place] Coordination Social Worker [place] [phone]

## 2020-02-23 NOTE — Progress Notes (Signed)
Internal Medicine Clinic Attending  CCM services provided by the care management provider and their documentation were discussed with Dr. Krienke. We reviewed the pertinent findings, urgent action items addressed by the resident and non-urgent items to be addressed by the PCP.  I agree with the assessment, diagnosis, and plan of care documented in the CCM and resident's note.  [place]ac[place]ia Darwish C Linell Shawn, DO 02/23/2020  

## 2020-02-24 ENCOUNTER — Telehealth: Payer: Self-pay

## 2020-02-24 NOTE — Telephone Encounter (Signed)
Pt requesting a call back, pt states he can't find a service to come out to assist him due to hours the physician wrote, please [phone]

## 2020-02-24 NOTE — Telephone Encounter (Signed)
  Chronic Care Management   Outreach Note  02/24/2020 Name: KAMAREON SCIANDRA MRN: [id number] DOB: 08/28/1965  Referred by: Riesa Pope, MD Reason for referral : Care Coordination (ALF placement, PCS)   Returned patient's phone call but had to leave message. Informed patient that CCM BSW will be unavailable from 11:00AM-1:00 PM today but he can call back after 1:00       Ronn Melena, [place] Worker [place] [phone]

## 2020-02-28 ENCOUNTER — Telehealth: Payer: Self-pay

## 2020-02-28 ENCOUNTER — Ambulatory Visit: Payer: Medicaid Other

## 2020-02-28 ENCOUNTER — Telehealth: Payer: Medicaid Other

## 2020-02-28 DIAGNOSIS — Z87898 Personal history of other specified conditions: Secondary | ICD-10-CM

## 2020-02-28 DIAGNOSIS — F259 Schizoaffective disorder, unspecified: Secondary | ICD-10-CM

## 2020-02-28 DIAGNOSIS — I10 Essential (primary) hypertension: Secondary | ICD-10-CM

## 2020-02-28 NOTE — Telephone Encounter (Addendum)
  Chronic Care Management   Outreach Note  02/28/2020 Name: Ethan [name] MRN: [id number] DOB: May 20, 1965  Referred by: Riesa Pope, MD Reason for referral : Care Coordination (level of care concerns)   Third unsuccessful attempt to follow up with patient regarding level of care concerns/transition to ALF.  Left voicemail message requesting return call.       Ronn Melena, [place] Coordination Social Worker [place] [phone]
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
## 2020-02-29 ENCOUNTER — Telehealth: Payer: Self-pay | Admitting: *Deleted

## 2020-02-29 ENCOUNTER — Ambulatory Visit: Payer: Medicaid Other

## 2020-02-29 DIAGNOSIS — Z87898 Personal history of other specified conditions: Secondary | ICD-10-CM

## 2020-02-29 DIAGNOSIS — I10 Essential (primary) hypertension: Secondary | ICD-10-CM

## 2020-02-29 DIAGNOSIS — F259 Schizoaffective disorder, unspecified: Secondary | ICD-10-CM

## 2020-02-29 NOTE — Telephone Encounter (Signed)
Patient's payee, [name], called for an appt for patient. States he has had blood on toilet tissue when he wipes following a BM x 2 weeks. States he is having 10-15 normal BMs per day. Denies abdominal pain, diarrhea, N/V. States patient eats well but is losing weight. Appt offered today, however patient only wants early morning appt. Call placed to patient. Patient is aware these sx are concerning and he should be seen as soon as possible. States these symptoms were discussed with Provider at his last OV but nothing was done. Refuses any appt this week. Wants an appt for next week early AM. Appt given for 03/07/2020 with [facility] Team as they are only ones with early appt opening. Hubbard Hartshorn, BSN, RN-BC
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
## 2020-02-29 NOTE — Telephone Encounter (Signed)
  We will see patient next week and address his concerns. Thank you

## 2020-03-01 ENCOUNTER — Ambulatory Visit: Payer: Medicaid Other

## 2020-03-01 DIAGNOSIS — F259 Schizoaffective disorder, unspecified: Secondary | ICD-10-CM

## 2020-03-01 DIAGNOSIS — I10 Essential (primary) hypertension: Secondary | ICD-10-CM

## 2020-03-01 DIAGNOSIS — Z87898 Personal history of other specified conditions: Secondary | ICD-10-CM

## 2020-03-01 NOTE — Progress Notes (Signed)
Internal Medicine Clinic Attending  CCM services provided by the care management provider and their documentation were discussed with Dr. Basaraba. We reviewed the pertinent findings, urgent action items addressed by the resident and non-urgent items to be addressed by the PCP.  I agree with the assessment, diagnosis, and plan of care documented in the CCM and resident's note.  Alynah Schone Thomas Kaylanie Capili, MD 03/01/2020  

## 2020-03-01 NOTE — Progress Notes (Signed)
Internal Medicine Clinic Attending  CCM services provided by the care management provider and their documentation were discussed with Dr. Basaraba. We reviewed the pertinent findings, urgent action items addressed by the resident and non-urgent items to be addressed by the PCP.  I agree with the assessment, diagnosis, and plan of care documented in the CCM and resident's note.  Nigel Wessman Thomas Joliana Claflin, MD 03/01/2020  

## 2020-03-01 NOTE — Progress Notes (Signed)
Internal Medicine Clinic Attending  CCM services provided by the care management provider and their documentation were discussed with Dr. Basaraba. We reviewed the pertinent findings, urgent action items addressed by the resident and non-urgent items to be addressed by the PCP.  I agree with the assessment, diagnosis, and plan of care documented in the CCM and resident's note.  Thompson Mckim Thomas Lerline Valdivia, MD 03/01/2020  

## 2020-03-01 NOTE — Progress Notes (Signed)
Internal Medicine Clinic Resident  I have personally reviewed this encounter including the documentation in this note and/or discussed this patient with the care management provider. I will address any urgent items identified by the care management provider and will communicate my actions to the patient's PCP. I have reviewed the patient's CCM visit with my supervising attending, Dr Vincent.  Harli Engelken, MD 03/01/2020  

## 2020-03-01 NOTE — Progress Notes (Signed)
Internal Medicine Clinic Resident  I have personally reviewed this encounter including the documentation in this note and/or discussed this patient with the care management provider. I will address any urgent items identified by the care management provider and will communicate my actions to the patient's PCP. I have reviewed the patient's CCM visit with my supervising attending, Dr Vincent.  Bennie Chirico, MD 03/01/2020  
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
## 2020-03-01 NOTE — Progress Notes (Signed)
Internal Medicine Clinic Resident  I have personally reviewed this encounter including the documentation in this note and/or discussed this patient with the care management provider. I will address any urgent items identified by the care management provider and will communicate my actions to the patient's PCP. I have reviewed the patient's CCM visit with my supervising attending, Dr Vincent.  Glendora Clouatre, MD 03/01/2020  
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
## 2020-03-07 ENCOUNTER — Ambulatory Visit: Payer: Medicaid Other | Admitting: Internal Medicine

## 2020-03-07 ENCOUNTER — Ambulatory Visit: Payer: Medicaid Other

## 2020-03-07 DIAGNOSIS — Z131 Encounter for screening for diabetes mellitus: Secondary | ICD-10-CM

## 2020-03-07 DIAGNOSIS — Z87898 Personal history of other specified conditions: Secondary | ICD-10-CM

## 2020-03-07 DIAGNOSIS — R634 Abnormal weight loss: Secondary | ICD-10-CM | POA: Diagnosis present

## 2020-03-07 DIAGNOSIS — F259 Schizoaffective disorder, unspecified: Secondary | ICD-10-CM

## 2020-03-07 DIAGNOSIS — I10 Essential (primary) hypertension: Secondary | ICD-10-CM

## 2020-03-07 LAB — GLUCOSE, CAPILLARY: Glucose-Capillary: 108 mg/dL — ABNORMAL HIGH (ref 70–99)

## 2020-03-07 LAB — POCT GLYCOSYLATED HEMOGLOBIN (HGB A1C): Hemoglobin A1C: 5.4 % (ref 4.0–5.6)

## 2020-03-07 NOTE — Progress Notes (Signed)
Internal Medicine Clinic Resident  I have personally reviewed this encounter including the documentation in this note and/or discussed this patient with the care management provider. I will address any urgent items identified by the care management provider and will communicate my actions to the patient's PCP. I have reviewed the patient's CCM visit with my supervising attending, Dr Philipp Ovens.  Asencion Noble, MD 03/07/2020
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
## 2020-03-07 NOTE — Progress Notes (Signed)
   CC: schizoaffective disorder and weight loss  HPI:[name] is a 54 y.o. male who presents for evaluation of schizoaffective disorder and weight loss . Please see individual problem based A/P for detai  Past Medical History:  Diagnosis Date  . Alcoholism ([place])   . Anxiety   . Depression   . GERD (gastroesophageal reflux disease)   . Heart murmur   . Hepatitis C    Treated in prison for 18 months.  Sounds like Ribaviron and Interferon  2009-2011  . Hypertension   . Peptic ulcer   . Substance abuse ([place])   . Urge urinary incontinence    07/20/2015:  evaluated by Dr. Kathie Rhodes, Urodynamics planned   Review of Systems:   Review of Systems  Constitutional: Positive for weight loss. Negative for chills, fever and malaise/fatigue.  Respiratory: Negative for cough and hemoptysis.   Cardiovascular: Negative for chest pain.  Psychiatric/Behavioral: Positive for depression. Negative for suicidal ideas. The patient is nervous/anxious.        Seeing and hearing things other do not      Physical Exam: Vitals:   03/07/20 0942  BP: (!) 143/92  Pulse: (!) 101  Temp: 98.2 F (36.8 C)  TempSrc: Oral  SpO2: 99%  Weight: 160 lb 14.4 oz (73 kg)   General: NAD, nl appearance HEENT: Normocephalic, atraumatic , Conjunctiva nl  Cardiovascular: Normal rate, regular rhythm.  No murmurs, rubs, or gallops Pulmonary : Equal breath sounds, No wheezes, rales, or rhonchi Abdominal: soft, nontender,  bowel sounds present Psychiatric: Depressed mood, normal affect , normal attention, normal speech, cooperative, paranoid thought content, normal judgment, no HI or SI    Assessment & Plan:   See Encounters Tab for problem based charting.  Patient discussed with Dr. Evette Doffing

## 2020-03-07 NOTE — Patient Instructions (Addendum)
Thank you, Ethan [name] for allowing [place] to provide your care today. Today we discussed schizoaffective disorder and weight loss.    I have ordered the following labs for you:   Lab Orders     Glucose, capillary     POC Hbg A1C   Tests ordered today:    Referrals ordered today:    Referral Orders     Ambulatory referral to [place]   I have ordered the following medication/changed the following medications:   Stop the following medications: There are no discontinued medications.   Start the following medications: No orders of the defined types were placed in this encounter.    Follow up: 3 months    Remember: I have made a referral for psychiatry. It is possible you could get a long term injectable medication which will control your symptoms. In addition you will receive a call to schedule CT Chest for weight loss.   Should you have any questions or concerns please call the internal medicine clinic at [phone].      Tamsen Snider, M.D. [place]
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
## 2020-03-08 ENCOUNTER — Encounter: Payer: Self-pay | Admitting: Internal Medicine

## 2020-03-08 NOTE — Assessment & Plan Note (Addendum)
Weight /BMI 03/07/2020 02/14/2020 11/09/2019 12/13/2016 11/27/2016  WEIGHT 160 lb 14.4 oz 161 lb 164 lb 9.6 oz     Weight /BMI 11/16/2015 10/18/2015 09/22/2015 07/17/2015 12/21/2014  WEIGHT 191 lb 197 lb 197 lb 6 oz 200 lb 8 oz    Weight /BMI 09/01/2013  WEIGHT 200 lb    Weights per chart above.  Patient states he was almost 200 pounds in the beginning of of 2021.  He had a visit with Dr. Truman Hayward in July and work-up was unremarkable.  He was unable to complete a low-dose CT scan for appropriate cancer screening.  Given he is having weight loss we will get a CT chest with contrast to look for possible lung cancer. He has smoked a half to a pack a day a large portion of his life. He was able to quit at one time for 10 years.  His last colonoscopy was 3 years ago.  CBC in July was within normal limits.   Assessment: Weight loss.  Will look for possible lung cancer with CT chest.  If CT chest normal is likely this weight loss could be secondary to his paranoia and not eating regular meals.  In addition patient has alcohol use disorder.    Plan: - CT Chest W Contrast; Future
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
## 2020-03-13 NOTE — Progress Notes (Signed)
Internal Medicine Clinic Attending ° °CCM services provided by the care management provider and their documentation were discussed with Dr. Krienke. We reviewed the pertinent findings, urgent action items addressed by the resident and non-urgent items to be addressed by the PCP.  I agree with the assessment, diagnosis, and plan of care documented in the CCM and resident's note. ° °Kassady Laboy, MD °03/13/2020 °

## 2020-03-13 NOTE — Progress Notes (Signed)
Internal Medicine Clinic Attending  Case discussed with Dr. Steen  At the time of the visit.  We reviewed the resident's history and exam and pertinent patient test results.  I agree with the assessment, diagnosis, and plan of care documented in the resident's note.  

## 2020-03-14 ENCOUNTER — Ambulatory Visit: Payer: Medicaid Other

## 2020-03-14 DIAGNOSIS — F259 Schizoaffective disorder, unspecified: Secondary | ICD-10-CM
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
## 2020-03-14 NOTE — Progress Notes (Signed)
Internal Medicine Clinic Attending  CCM services provided by the care management provider and their documentation were discussed with Dr. Krienke. We reviewed the pertinent findings, urgent action items addressed by the resident and non-urgent items to be addressed by the PCP.  I agree with the assessment, diagnosis, and plan of care documented in the CCM and resident's note.  Needham Biggins, MD 03/14/2020  

## 2020-03-14 NOTE — Progress Notes (Signed)
Internal Medicine Clinic Resident  I have personally reviewed this encounter including the documentation in this note and/or discussed this patient with the care management provider. I will address any urgent items identified by the care management provider and will communicate my actions to the patient's PCP. I have reviewed the patient's CCM visit with my supervising attending, Dr Narendra.  Jaysion Ramseyer M Phyllicia Dudek, MD 03/14/2020    

## 2020-03-15 ENCOUNTER — Ambulatory Visit: Payer: Medicaid Other

## 2020-03-15 ENCOUNTER — Ambulatory Visit: Payer: Medicaid Other | Admitting: *Deleted

## 2020-03-15 DIAGNOSIS — F259 Schizoaffective disorder, unspecified: Secondary | ICD-10-CM

## 2020-03-15 DIAGNOSIS — R634 Abnormal weight loss: Secondary | ICD-10-CM

## 2020-03-15 DIAGNOSIS — Z87898 Personal history of other specified conditions: Secondary | ICD-10-CM

## 2020-03-15 DIAGNOSIS — I10 Essential (primary) hypertension: Secondary | ICD-10-CM
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
## 2020-03-15 NOTE — Chronic Care Management (AMB) (Signed)
Care Management   Follow Up Note   03/15/2020 Name: Ethan [name] MRN: [id number] DOB: 1966-02-23  Ethan [name] is enrolled in a Managed Medicaid plan: No. Outreach attempt today was successful.    Referred by: Riesa Pope, MD Reason for referral : Care Coordination (HTN, prediabetes, smokes)   Ethan [name] is a 54 y.o. year old male who is a primary care patient of Katsadouros, Candace Gallus, MD. The care management team was consulted for assistance with care management and care coordination needs.    Review of patient status, including review of consultants reports, relevant laboratory and other test results, and collaboration with appropriate care team members and the patient's provider was performed as part of comprehensive patient evaluation and provision of chronic care management services.    Goals Addressed              This Visit's Progress     Patient Stated     "I check my blood pressure at home and take my medications like I am suppose to." (pt-stated)        [place] (see longitudinal plan of care for additional care plan information)  Objective:   BP Readings from Last 3 Encounters:  03/07/20  (!) 143/92  02/14/20  (!) 167/103  11/09/19  (!) 123/96   Lab Results  Component Value Date   CREATININE 1.06 11/09/2019   BUN 11 11/09/2019   NA 138 11/09/2019   K 4.1 11/09/2019   CL 103 11/09/2019   CO2 21 11/09/2019     Current Barriers:   Knowledge Deficits related to basic understanding of hypertension pathophysiology and self care management-  spoke with patient to assess self monitored blood pressures, patient states he is not home, just finished touring [facility],  says he cannot recall his recent home readings, reports he is now taking his medications as prescribed because he now has a PCS aide   -Case Manager Clinical Goal(s):   Over the next 30-60 days, patient will demonstrate improved health management independence  as evidenced by checking blood pressure as directed and notifying PCP if SBP>180 or DBP > 100 taking all medications as prescribe, and adhering to a low sodium diet as discussed.  Patient will complete appointment for chest CT  Interventions:   Evaluation of current treatment plan related to HTN and patient's adherence to plan as established by provider.  Reviewed medications with patient and discussed medication taking behavior  Reviewed home blood pressure readings and reviewed blood pressure targets  Positive reinforcement given to patient for home BP self monitoring  Encouraged patient to contact this CCM RN for questions or  concerns about BP self monitoring.  Discussed chest CT  rescheduled for 03/23/20 ordered to rule out lung cancer since he continues to have unintentional weight loss and smokes  Discussed plans with patient for ongoing care management follow up and ensured he has the contact number for the CCM team and clinic  Advised patient [place] will call him later today to discuss housing options  Patient Self Care Activities:   Self administers medications as prescribed  Attends all scheduled provider appointments  Calls provider office for new concerns, questions, or BP outside discussed parameters  Checks BP and records as discussed  Follows a low sodium diet/DASH diet  Completes CT of lung appointment  Please see past updates related to this goal by clicking on the "Past Updates" button in the selected goal  The care management team will reach out to the patient again over the next 30-60 days.   Kelli Churn RN, CCM, [facility] RN Care Manager [phone]

## 2020-03-15 NOTE — Progress Notes (Signed)
Internal Medicine Clinic Resident  I have personally reviewed this encounter including the documentation in this note and/or discussed this patient with the care management provider. I will address any urgent items identified by the care management provider and will communicate my actions to the patient's PCP. I have reviewed the patient's CCM visit with my supervising attending, Dr Dareen Piano.  Gaylan Gerold, DO 03/15/2020

## 2020-03-15 NOTE — Progress Notes (Signed)
Internal Medicine Clinic Resident  I have personally reviewed this encounter including the documentation in this note and/or discussed this patient with the care management provider. I will address any urgent items identified by the care management provider and will communicate my actions to the patient's PCP. I have reviewed the patient's CCM visit with my supervising attending, Dr Dareen Piano.  Foy [place], MD 03/15/2020
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
## 2020-03-15 NOTE — Patient Instructions (Signed)
Visit Information It was nice speaking with you today. Goals Addressed              This Visit's Progress     Patient Stated   .  "I check my blood pressure at home and take my medications like I am suppose to." (pt-stated)        CARE PLAN ENTRY (see longitudinal plan of care for additional care plan information)  Objective:  [name] BP Readings from Last 3 Encounters: .  03/07/20 . (!) 143/92 .  02/14/20 . (!) 167/103 .  11/09/19 . (!) 123/96   Lab Results  Component Value Date   CREATININE 1.06 11/09/2019   BUN 11 11/09/2019   NA 138 11/09/2019   K 4.1 11/09/2019   CL 103 11/09/2019   CO2 21 11/09/2019     Current Barriers:  [name] Knowledge Deficits related to basic understanding of hypertension pathophysiology and self care management-  spoke with patient to assess self monitored blood pressures, patient states he is not home, just finished touring [facility],  says he cannot recall his recent home readings, reports he is now taking his medications as prescribed because he now has a Water quality scientist  . -Case Manager Clinical Goal(s):  [name] Over the next 30-60 days, patient will demonstrate improved health management independence as evidenced by checking blood pressure as directed and notifying PCP if SBP>180 or DBP > 100 taking all medications as prescribe, and adhering to a low sodium diet as discussed. . Patient will complete appointment for chest CT  Interventions:  . Evaluation of current treatment plan related to HTN and patient's adherence to plan as established by provider. . Reviewed medications with patient and discussed medication taking behavior . Reviewed home blood pressure readings and reviewed blood pressure targets . Positive reinforcement given to patient for home BP self monitoring . Encouraged patient to contact this CCM RN for questions or  concerns about BP self monitoring. . Discussed chest CT  rescheduled for 03/23/20 ordered to rule out lung cancer since he  continues to have unintentional weight loss and smokes . Discussed plans with patient for ongoing care management follow up and ensured he has the contact number for the CCM team and clinic . Advised patient [place] will call him later today to discuss housing options  Patient Self Care Activities:  . Self administers medications as prescribed . Attends all scheduled provider appointments . Calls provider office for new concerns, questions, or BP outside discussed parameters . Checks BP and records as discussed . Follows a low sodium diet/DASH diet . Completes CT of lung appointment  Please see past updates related to this goal by clicking on the "Past Updates" button in the selected goal          The patient verbalized understanding of instructions, educational materials, and care plan provided today and declined offer to receive copy of patient instructions, educational materials, and care plan.   The care management team will reach out to the patient again over the next 30-60 days.   Kelli Churn RN, CCM, [facility] RN Care Manager [phone]

## 2020-03-17 NOTE — Progress Notes (Signed)
Internal Medicine Clinic Attending  CCM services provided by the care management provider and their documentation were discussed with Dr. Wynetta Emery. We reviewed the pertinent findings, urgent action items addressed by the resident and non-urgent items to be addressed by the PCP.  I agree with the assessment, diagnosis, and plan of care documented in the CCM and resident's note.  Aldine Contes, MD 03/17/2020

## 2020-03-23 ENCOUNTER — Ambulatory Visit (HOSPITAL_COMMUNITY): Payer: Medicaid Other | Attending: Student in an Organized Health Care Education/Training Program

## 2020-03-23 ENCOUNTER — Institutional Professional Consult (permissible substitution): Payer: Medicaid Other | Admitting: Behavioral Health

## 2020-03-23 NOTE — Progress Notes (Signed)
Internal Medicine Clinic Attending  CCM services provided by the care management provider and their documentation were discussed with Dr. Alfonse Spruce. We reviewed the pertinent findings, urgent action items addressed by the resident and non-urgent items to be addressed by the PCP.  I agree with the assessment, diagnosis, and plan of care documented in the CCM and resident's note.  Aldine Contes, MD 03/23/2020

## 2020-04-11 ENCOUNTER — Ambulatory Visit: Payer: Medicaid Other

## 2020-04-11 DIAGNOSIS — I10 Essential (primary) hypertension: Secondary | ICD-10-CM

## 2020-04-11 DIAGNOSIS — F259 Schizoaffective disorder, unspecified: Secondary | ICD-10-CM

## 2020-04-11 DIAGNOSIS — Z87898 Personal history of other specified conditions: Secondary | ICD-10-CM
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
## 2020-04-11 NOTE — Progress Notes (Signed)
Internal Medicine Clinic Resident  I have personally reviewed this encounter including the documentation in this note and/or discussed this patient with the care management provider. I will address any urgent items identified by the care management provider and will communicate my actions to the patient's PCP. I have reviewed the patient's CCM visit with my supervising attending, Dr Criselda Peaches.  Theotis Barrio, MD 04/11/2020

## 2020-04-13 ENCOUNTER — Telehealth: Payer: Medicaid Other

## 2020-04-20 ENCOUNTER — Telehealth: Payer: Self-pay | Admitting: *Deleted

## 2020-04-20 ENCOUNTER — Telehealth: Payer: Medicaid Other

## 2020-04-20 NOTE — Telephone Encounter (Signed)
  Care Management   Outreach Note  04/20/2020 Name: Ethan [name] MRN: [id number] DOB: April 28, 1965  Referred by: Belva Agee, MD Reason for referral : Care Coordination (HTN, prediabetes, smokes)   Ethan [name] is enrolled in a Managed Medicaid Health Plan: No  An unsuccessful telephone outreach was attempted today. The patient was referred to the case management team for assistance with care management and care coordination.   Follow Up Plan: A HIPAA compliant phone message was left for the patient providing contact information and requesting a return call.  The care management team will reach out to the patient again over the next 7-14 days.   Cranford Mon RN, CCM, [facility] CCM Clinic RN Care Manager [phone]

## 2020-04-24 NOTE — Addendum Note (Signed)
Addended by: Hulan Fray on: 04/24/2020 02:48 PM   Modules accepted: Orders

## 2020-04-27 ENCOUNTER — Ambulatory Visit: Payer: Medicaid Other | Admitting: *Deleted

## 2020-04-27 DIAGNOSIS — F259 Schizoaffective disorder, unspecified: Secondary | ICD-10-CM

## 2020-04-27 DIAGNOSIS — I10 Essential (primary) hypertension: Secondary | ICD-10-CM

## 2020-04-27 DIAGNOSIS — Z87898 Personal history of other specified conditions: Secondary | ICD-10-CM

## 2020-04-27 NOTE — Patient Instructions (Signed)
Visit Information It was nice speaking with you today.  Patient Care Plan: CCM RN    Problem Identified: Hypertension (Hypertension)   Priority: High    Goal: Hypertension Monitored   Start Date: 11/15/2019  Expected End Date: 12/12/2020  This Visit's Progress: On track  Priority: High  Note:   CARE PLAN ENTRY (see longitudinal plan of care for additional care plan information)  Objective:  [name] BP Readings from Last 3 Encounters: .  03/07/20 . (!) 143/92 .  02/14/20 . (!) 167/103 .  11/09/19 . (!) 123/96   Lab Results  Component Value Date   CREATININE 1.06 11/09/2019   BUN 11 11/09/2019   NA 138 11/09/2019   K 4.1 11/09/2019   CL 103 11/09/2019   CO2 21 11/09/2019     Current Barriers:  [name] Knowledge Deficits related to basic understanding of hypertension pathophysiology and self care management-  spoke with patient to assess self monitored blood pressures, patient states all readings are under 140/90,  reports he is taking his medications as prescribed because he now has a PCS aide, says his weight is stable, regarding his living situation, he says he hopes to move in the near future and would like to talk with a Education officer, museum about options and payee issues   . -Case Manager Clinical Goal(s):  [name] Over the next 30-60 days, patient will demonstrate improved health management independence as evidenced by checking blood pressure as directed and notifying PCP if SBP>180 or DBP > 100 taking all medications as prescribe, and adhering to a low sodium diet as discussed. . Patient will complete appointment for chest CT- not met as of 04/27/20  Interventions:  . Evaluation of current treatment plan related to HTN and patient's adherence to plan as established by provider. . Reviewed medications with patient and discussed medication taking behavior . Reviewed home blood pressure readings and reviewed blood pressure targets . Positive reinforcement given to patient for home BP self  monitoring . Encouraged patient to contact this CCM RN for questions or  concerns about BP self monitoring. . Discussed plans with patient for ongoing care management follow up and ensured he has the contact number for the CCM team and clinic . Per patient request will ask BSW [name] to contact patient about housing options and payee issues  Patient Self Care Activities:  . Self administers medications as prescribed . Attends all scheduled provider appointments . Calls provider office for new concerns, questions, or BP outside discussed parameters . Checks BP at least weekly and records as discussed . Follows a low sodium diet/DASH diet . follow-up on any referrals for help I am given . Reschedules CT of lung appointment . arrange a ride through an agency 1 week before appointment . - ask family or friend for a ride . - call to cancel if needed . - keep a calendar with appointment dates . - use [place] [facility] for transportation      The patient verbalized understanding of instructions, educational materials, and care plan provided today and declined offer to receive copy of patient instructions, educational materials, and care plan.   The care management team will reach out to the patient again over the next 30-60 days.   Kelli Churn RN, CCM, [facility] RN Care Manager [phone]

## 2020-04-27 NOTE — Chronic Care Management (AMB) (Signed)
Chronic Care Management   CCM RN Visit Note  04/27/2020 Name: Ethan [name] MRN: [id number] DOB: 1965-10-30  Subjective: Ethan [name] is a 55 y.o. year old male who is a primary care patient of Katsadouros, Candace Gallus, MD. The care management team was consulted for assistance with disease management and care coordination needs.    Engaged with patient by telephone for follow up visit in response to provider referral for case management and/or care coordination services.   Consent to Services:  The patient was given information about Chronic Care Management services, agreed to services, and gave verbal consent prior to initiation of services.  Please see initial visit note for detailed documentation.   Patient agreed to services and verbal consent obtained.   Assessment: Review of patient past medical history, allergies, medications, health status, including review of consultants reports, laboratory and other test data, was performed as part of comprehensive evaluation and provision of chronic care management services.   SDOH (Social Determinants of Health) assessments and interventions performed:    CCM Care Plan  No Known Allergies  Outpatient Encounter Medications as of 04/27/2020  Medication Sig  . amLODipine (NORVASC) 5 MG tablet Take 1 tablet (5 mg total) by mouth daily.  . Blood Pressure Monitoring (BLOOD PRESSURE KIT) DEVI Measure blood pressure twice a day  . hydrOXYzine (ATARAX/VISTARIL) 25 MG tablet Take 1 tablet (25 mg total) by mouth every 6 (six) hours as needed for anxiety.  . nicotine (NICODERM CQ - DOSED IN MG/24 HOURS) 21 mg/24hr patch Place 1 patch (21 mg total) onto the skin daily. (Patient not taking: Reported on 11/15/2019)  . pantoprazole (PROTONIX) 40 MG tablet Take 1 tablet (40 mg total) by mouth daily.  . risperiDONE (RISPERDAL) 0.5 MG tablet Take 1 tablet (0.5 mg total) by mouth 2 (two) times daily.  . traZODone (DESYREL) 50 MG tablet Take 1 tablet (50 mg  total) by mouth at bedtime and may repeat dose one time if needed.  . valsartan-hydrochlorothiazide (DIOVAN-HCT) 160-12.5 MG tablet Take 1 tablet by mouth daily.   No facility-administered encounter medications on file as of 04/27/2020.    Patient Active Problem List   Diagnosis Date Noted  . Healthcare maintenance 02/14/2020  . Weight loss, unintentional 11/10/2019  . History of prediabetes 11/10/2019  . GERD (gastroesophageal reflux disease) 12/22/2014  . Tobacco use disorder 12/22/2014  . Cannabis use disorder, severe, dependence ([place]) 12/22/2014  . Alcohol use disorder, mild, abuse 12/22/2014  . Schizoaffective disorder ([place]) 12/22/2014  . History of hepatitis C virus infection 09/01/2013    Conditions to be addressed/monitored:HTN, Prediabetes, GERD, Schizoaffective Disorder  Care Plan : CCM RN  Updates made by Barrington Ellison, RN since 04/27/2020 12:00 AM    Problem: Hypertension (Hypertension)   Priority: High    Goal: Hypertension Monitored   Start Date: 11/15/2019  Expected End Date: 12/12/2020  This Visit's Progress: On track  Priority: High  Note:   CARE PLAN ENTRY (see longitudinal plan of care for additional care plan information)  Objective:  [name] BP Readings from Last 3 Encounters: .  03/07/20 . (!) 143/92 .  02/14/20 . (!) 167/103 .  11/09/19 . (!) 123/96   Lab Results  Component Value Date   CREATININE 1.06 11/09/2019   BUN 11 11/09/2019   NA 138 11/09/2019   K 4.1 11/09/2019   CL 103 11/09/2019   CO2 21 11/09/2019     Current Barriers:  [name] Knowledge Deficits related to basic understanding of hypertension  pathophysiology and self care management-  spoke with patient to assess self monitored blood pressures, patient states all readings are under 140/90,  reports he is taking his medications as prescribed because he now has a PCS aide, says his weight is stable, regarding his living situation, he says he hopes to move in the near future and would like to  talk with a Education officer, museum about options and payee issues   . -Case Manager Clinical Goal(s):  [name] Over the next 30-60 days, patient will demonstrate improved health management independence as evidenced by checking blood pressure as directed and notifying PCP if SBP>180 or DBP > 100 taking all medications as prescribe, and adhering to a low sodium diet as discussed. . Patient will complete appointment for chest CT- not met as of 04/27/20  Interventions:  . Evaluation of current treatment plan related to HTN and patient's adherence to plan as established by provider. . Reviewed medications with patient and discussed medication taking behavior . Reviewed home blood pressure readings and reviewed blood pressure targets . Positive reinforcement given to patient for home BP self monitoring . Encouraged patient to contact this CCM RN for questions or  concerns about BP self monitoring. . Discussed plans with patient for ongoing care management follow up and ensured he has the contact number for the CCM team and clinic . Per patient request will ask BSW [name] to contact patient about housing options and payee issues  Patient Self Care Activities:  . Self administers medications as prescribed . Attends all scheduled provider appointments . Calls provider office for new concerns, questions, or BP outside discussed parameters . Checks BP at least weekly and records as discussed . Follows a low sodium diet/DASH diet . follow-up on any referrals for help I am given . Reschedules CT of lung appointment . arrange a ride through an agency 1 week before appointment . - ask family or friend for a ride . - call to cancel if needed . - keep a calendar with appointment dates . - use [place] [facility] for transportation      Plan:The care management team will reach out to the patient again over the next 30-60 days.   Kelli Churn RN, CCM, [facility] RN Care  Manager [phone]

## 2020-04-27 NOTE — Progress Notes (Signed)
Internal Medicine Clinic Resident  I have personally reviewed this encounter including the documentation in this note and/or discussed this patient with the care management provider. I will address any urgent items identified by the care management provider and will communicate my actions to the patient's PCP. I have reviewed the patient's CCM visit with my supervising attending, Dr Raines.  Danayah Smyre, MD  [facility] PGY-2 04/27/2020    

## 2020-05-02 ENCOUNTER — Ambulatory Visit: Payer: Self-pay

## 2020-05-02 ENCOUNTER — Ambulatory Visit: Payer: Medicaid Other

## 2020-05-02 DIAGNOSIS — F259 Schizoaffective disorder, unspecified: Secondary | ICD-10-CM

## 2020-05-02 DIAGNOSIS — I10 Essential (primary) hypertension: Secondary | ICD-10-CM

## 2020-05-02 NOTE — Patient Instructions (Addendum)
Visit Information   Social Worker will contact patient in 7 days if telephone call is not returned.[name], BSW, [place]

## 2020-05-02 NOTE — Chronic Care Management (AMB) (Signed)
Chronic Care Management    Social Work Note  05/02/2020 Name: Ethan [name] MRN: [id number] DOB: 1965-04-23  Ethan [name] is a 55 y.o. year old male who is a primary care patient of Katsadouros, Candace Gallus, MD. The CCM team was consulted to assist the patient with chronic disease management and/or care coordination needs related to: housing and payee resources.   Engaged with patient Engaged with patient by telephone for initial visit in response to provider referral for social work chronic care management and care coordination services.   Consent to Services:    Patient agreed to services and consent obtained.   Assessment/Interventions: Review of patient past medical history, allergies, medications, and health status, including review of relevant consultants reports was performed today as part of a comprehensive evaluation and provision of chronic care management and care coordination services.     SDOH (Social Determinants of Health) assessments and interventions performed:    BSW contacted patient to discuss housing and payee needs, Patient stated he was getting ready to leave and would call BSW back later.  BSW will contact patient in 7 days if he does not contact BSW back.  Advanced Directives Status: Not addressed in this encounter.  CCM Care Plan  No Known Allergies  @[email or web address] @  Patient Active Problem List   Diagnosis Date Noted  . Healthcare maintenance 02/14/2020  . Weight loss, unintentional 11/10/2019  . History of prediabetes 11/10/2019  . GERD (gastroesophageal reflux disease) 12/22/2014  . Tobacco use disorder 12/22/2014  . Cannabis use disorder, severe, dependence ([employer]) 12/22/2014  . Alcohol use disorder, mild, abuse 12/22/2014  . Schizoaffective disorder ([place]) 12/22/2014  . History of hepatitis C virus infection 09/01/2013    Conditions to be addressed/monitored per PCP order: none; housing and payee services  Patient Care Plan: CCM RN     Problem Identified: Hypertension (Hypertension)   Priority: High    Goal: Hypertension Monitored   Start Date: 11/15/2019  Expected End Date: 12/12/2020  This Visit's Progress: On track  Priority: High  Note:   CARE PLAN ENTRY (see longitudinal plan of care for additional care plan information)  Objective:  [name] BP Readings from Last 3 Encounters: .  03/07/20 . (!) 143/92 .  02/14/20 . (!) 167/103 .  11/09/19 . (!) 123/96   Lab Results  Component Value Date   CREATININE 1.06 11/09/2019   BUN 11 11/09/2019   NA 138 11/09/2019   K 4.1 11/09/2019   CL 103 11/09/2019   CO2 21 11/09/2019     Current Barriers:  [name] Knowledge Deficits related to basic understanding of hypertension pathophysiology and self care management-  spoke with patient to assess self monitored blood pressures, patient states all readings are under 140/90,  reports he is taking his medications as prescribed because he now has a PCS aide, says his weight is stable, regarding his living situation, he says he hopes to move in the near future and would like to talk with a Education officer, museum about options and payee issues   . -Case Manager Clinical Goal(s):  [name] Over the next 30-60 days, patient will demonstrate improved health management independence as evidenced by checking blood pressure as directed and notifying PCP if SBP>180 or DBP > 100 taking all medications as prescribe, and adhering to a low sodium diet as discussed. . Patient will complete appointment for chest CT- not met as of 04/27/20  Interventions:  . Evaluation of current treatment plan related to HTN and patient's adherence to plan  as established by provider. . Reviewed medications with patient and discussed medication taking behavior . Reviewed home blood pressure readings and reviewed blood pressure targets . Positive reinforcement given to patient for home BP self monitoring . Encouraged patient to contact this CCM RN for questions or  concerns about BP self  monitoring. . Discussed plans with patient for ongoing care management follow up and ensured he has the contact number for the CCM team and clinic . Per patient request will ask BSW [name] to contact patient about housing options and payee issues  Patient Self Care Activities:  . Self administers medications as prescribed . Attends all scheduled provider appointments . Calls provider office for new concerns, questions, or BP outside discussed parameters . Checks BP at least weekly and records as discussed . Follows a low sodium diet/DASH diet . follow-up on any referrals for help I am given . Reschedules CT of lung appointment . arrange a ride through an agency 1 week before appointment . - ask family or friend for a ride . - call to cancel if needed . - keep a calendar with appointment dates . - use [place] [facility] for transportation       Follow Up Plan: SW will follow up with patient by phone over the next 7 days if patient does not contact bsw back today.      [name], BSW, [place]

## 2020-05-03 ENCOUNTER — Telehealth: Payer: Medicaid Other

## 2020-05-04 NOTE — Patient Instructions (Signed)
Visit Information  Patient Care Plan: CCM RN    Problem Identified: Hypertension (Hypertension)   Priority: High    Goal: Hypertension Monitored   Start Date: 11/15/2019  Expected End Date: 12/12/2020  This Visit's Progress: On track  Priority: High  Note:   CARE PLAN ENTRY (see longitudinal plan of care for additional care plan information)  Objective:  [name] BP Readings from Last 3 Encounters: .  03/07/20 . (!) 143/92 .  02/14/20 . (!) 167/103 .  11/09/19 . (!) 123/96   Lab Results  Component Value Date   CREATININE 1.06 11/09/2019   BUN 11 11/09/2019   NA 138 11/09/2019   K 4.1 11/09/2019   CL 103 11/09/2019   CO2 21 11/09/2019     Current Barriers:  [name] Knowledge Deficits related to basic understanding of hypertension pathophysiology and self care management-  spoke with patient to assess self monitored blood pressures, patient states all readings are under 140/90,  reports he is taking his medications as prescribed because he now has a PCS aide, says his weight is stable, regarding his living situation, he says he hopes to move in the near future and would like to talk with a Education officer, museum about options and payee issues   . -Case Manager Clinical Goal(s):  [name] Over the next 30-60 days, patient will demonstrate improved health management independence as evidenced by checking blood pressure as directed and notifying PCP if SBP>180 or DBP > 100 taking all medications as prescribe, and adhering to a low sodium diet as discussed. . Patient will complete appointment for chest CT- not met as of 04/27/20  Interventions:  . Evaluation of current treatment plan related to HTN and patient's adherence to plan as established by provider. . Reviewed medications with patient and discussed medication taking behavior . Reviewed home blood pressure readings and reviewed blood pressure targets . Positive reinforcement given to patient for home BP self monitoring . Encouraged patient to contact this  CCM RN for questions or  concerns about BP self monitoring. . Discussed plans with patient for ongoing care management follow up and ensured he has the contact number for the CCM team and clinic . Per patient request will ask BSW [name] to contact patient about housing options and payee issues  Patient Self Care Activities:  . Self administers medications as prescribed . Attends all scheduled provider appointments . Calls provider office for new concerns, questions, or BP outside discussed parameters . Checks BP at least weekly and records as discussed . Follows a low sodium diet/DASH diet . follow-up on any referrals for help I am given . Reschedules CT of lung appointment . arrange a ride through an agency 1 week before appointment . - ask family or friend for a ride . - call to cancel if needed . - keep a calendar with appointment dates . - use [place] [facility] for transportation      Mr. Brindisi was given information about Chronic Care Management services today including:  1. CCM service includes personalized support from designated clinical staff supervised by his physician, including individualized plan of care and coordination with other care providers 2. 24/7 contact phone numbers for assistance for urgent and routine care needs. 3. Service will only be billed when office clinical staff spend 20 minutes or more in a month to coordinate care. 4. Only one practitioner may furnish and bill the service in a calendar month. 5. The patient may stop CCM services at any time (effective  at the end of the month) by phone call to the office staff. 6. The patient will be responsible for cost sharing (co-pay) of up to 20% of the service fee (after annual deductible is met).  Patient agreed to services and verbal consent obtained.   Patient verbalizes understanding of instructions provided today.  The care management team will reach out to the patient again over the next 7-10 days.    Ethan [name]

## 2020-05-04 NOTE — Chronic Care Management (AMB) (Signed)
Chronic Care Management    Social Work Note  05/04/2020 Name: Ethan [name] MRN: [id number] DOB: [birth year]  Ethan [name] is a 55 y.o. year old male who is a primary care patient of Katsadouros, Candace Gallus, MD. The CCM team was consulted to assist the patient with chronic disease management and/or care coordination needs related to: Housing and payee services.   Engaged with patient Engaged with patient by telephone for initial visit in response to provider referral for social work chronic care management and care coordination services.   Consent to Services:  Patient was given the following information about Chronic Care Management services today, agreed to services, and gave verbal consent: 1. CCM service includes personalized support from designated clinical staff supervised by primary care provider, including individualized plan of care and coordination with other care providers 2. 24/7 contact phone numbers for assistance for urgent and routine care needs. 3. Service will only be billed when office clinical staff spend 20 minutes or more in a month to coordinate care. 4. Only one practitioner may furnish and bill the service in a calendar month. 5.The patient may stop CCM services at any time (effective at the end of the month) by phone call to the office staff. 6. The patient will be responsible for cost sharing (co-pay) of up to 20% of the service fee (after annual deductible is met). Patient agreed to services and consent obtained.  Patient agreed to services and consent obtained.   Assessment/Interventions: Review of patient past medical history, allergies, medications, and health status, including review of relevant consultants reports was performed today as part of a comprehensive evaluation and provision of chronic care management and care coordination services.     SDOH (Social Determinants of Health) assessments and interventions performed:    Patient contacted BSW and left  voicemail. BSW contacted patient, patient stated that he wants to live on his own but does not want to be further off then what he is now. Patient states he and Ethan [name] (current payee) spilt rent and bills. He is okay with Ms. Ethan [name] continuing to serve as his payee.  Patient stated all of the housing resources he was given did not work. BSW informed patient that she would continue to do some research but could not make any promises about housing as apartments have gone up in price. BSW will also check with the state on the blind rep for Ethan [name], patient states he is legally blind getting 841 in disability each month.  Advanced Directives Status: Not addressed in this encounter.  CCM Care Plan  No Known Allergies  _0 @  Patient Active Problem List   Diagnosis Date Noted  . Healthcare maintenance 02/14/2020  . Weight loss, unintentional 11/10/2019  . History of prediabetes 11/10/2019  . GERD (gastroesophageal reflux disease) 12/22/2014  . Tobacco use disorder 12/22/2014  . Cannabis use disorder, severe, dependence ([place]) 12/22/2014  . Alcohol use disorder, mild, abuse 12/22/2014  . Schizoaffective disorder ([place]) 12/22/2014  . History of hepatitis C virus infection 09/01/2013    Conditions to be addressed/monitored per PCP order: none; housing and services for the blind  Patient Care Plan: CCM RN    Problem Identified: Hypertension (Hypertension)   Priority: High    Goal: Hypertension Monitored   Start Date: 11/15/2019  Expected End Date: 12/12/2020  This Visit's Progress: On track  Priority: High  Note:   CARE PLAN ENTRY (see longitudinal plan of care for additional care plan information)  Objective:  .  BP Readings from Last 3 Encounters: .  03/07/20 . (!) 143/92 .  02/14/20 . (!) 167/103 .  11/09/19 . (!) 123/96   Lab Results  Component Value Date   CREATININE 1.06 11/09/2019   BUN 11 11/09/2019   NA 138 11/09/2019   K 4.1 11/09/2019   CL 103  11/09/2019   CO2 21 11/09/2019     Current Barriers:  [name] Knowledge Deficits related to basic understanding of hypertension pathophysiology and self care management-  spoke with patient to assess self monitored blood pressures, patient states all readings are under 140/90,  reports he is taking his medications as prescribed because he now has a PCS aide, says his weight is stable, regarding his living situation, he says he hopes to move in the near future and would like to talk with a Education officer, museum about options and payee issues   . -Case Manager Clinical Goal(s):  [name] Over the next 30-60 days, patient will demonstrate improved health management independence as evidenced by checking blood pressure as directed and notifying PCP if SBP>180 or DBP > 100 taking all medications as prescribe, and adhering to a low sodium diet as discussed. . Patient will complete appointment for chest CT- not met as of 04/27/20  Interventions:  . Evaluation of current treatment plan related to HTN and patient's adherence to plan as established by provider. . Reviewed medications with patient and discussed medication taking behavior . Reviewed home blood pressure readings and reviewed blood pressure targets . Positive reinforcement given to patient for home BP self monitoring . Encouraged patient to contact this CCM RN for questions or  concerns about BP self monitoring. . Discussed plans with patient for ongoing care management follow up and ensured he has the contact number for the CCM team and clinic . Per patient request will ask BSW [name] to contact patient about housing options and payee issues  Patient Self Care Activities:  . Self administers medications as prescribed . Attends all scheduled provider appointments . Calls provider office for new concerns, questions, or BP outside discussed parameters . Checks BP at least weekly and records as discussed . Follows a low sodium diet/DASH diet . follow-up  on any referrals for help I am given . Reschedules CT of lung appointment . arrange a ride through an agency 1 week before appointment . - ask family or friend for a ride . - call to cancel if needed . - keep a calendar with appointment dates . - use  Access for transportation       Follow Up Plan: SW will follow up with patient by phone over the next 7-10 days.      [name], BSW, [place]  High Risk Managed Medicaid Team

## 2020-05-11 ENCOUNTER — Telehealth: Payer: Medicaid Other

## 2020-05-11 ENCOUNTER — Ambulatory Visit: Payer: Self-pay

## 2020-05-11 DIAGNOSIS — F259 Schizoaffective disorder, unspecified: Secondary | ICD-10-CM

## 2020-05-11 DIAGNOSIS — I10 Essential (primary) hypertension: Secondary | ICD-10-CM

## 2020-05-11 NOTE — Chronic Care Management (AMB) (Signed)
  Chronic Care Management   Outreach Note  05/11/2020 Name: Ethan [name] MRN: [id number] DOB: [birth year]  Referred by: Riesa Pope, MD Reason for referral : Chronic Care Management (Social Work Resources)   An unsuccessful telephone outreach was attempted today. The patient was referred to the case management team for assistance with care management and care coordination.   Follow Up Plan: The care management team will reach out to the patient again over the next 7 days.   [name], BSW, [place]  High Risk Managed Medicaid Team

## 2020-05-15 NOTE — Progress Notes (Signed)
Internal Medicine Clinic Attending  CCM services provided by the care management provider and their documentation were reviewed with Dr. Aslam.  We reviewed the pertinent findings, urgent action items addressed by the resident and non-urgent items to be addressed by the PCP.  I agree with the assessment, diagnosis, and plan of care documented in the CCM and resident's note.  Zedric Deroy N Sahej Schrieber, MD 05/15/2020  

## 2020-05-22 ENCOUNTER — Telehealth: Payer: Medicaid Other

## 2020-05-22 ENCOUNTER — Ambulatory Visit: Payer: Self-pay

## 2020-05-22 DIAGNOSIS — I10 Essential (primary) hypertension: Secondary | ICD-10-CM

## 2020-05-22 DIAGNOSIS — F259 Schizoaffective disorder, unspecified: Secondary | ICD-10-CM

## 2020-05-22 NOTE — Patient Instructions (Signed)
Visit Information  Mr. Ethan [name], Thank you for speaking with me today. Once you receive a call back from the possiable housing placement, please contact me at [phone].     The care management team will reach out to the patient again over the next 30 days.   Ethan [name], BSW, [facility] health Internal Medicine

## 2020-05-22 NOTE — Chronic Care Management (AMB) (Signed)
Chronic Care Management    Social Work Note  05/22/2020 Name: Ethan [name] MRN: [id number] DOB: [birth year]  BRIGHTON PILLEY is a 55 y.o. year old male who is a primary care patient of Katsadouros, Candace Gallus, MD. The CCM team was consulted to assist the patient with chronic disease management and/or care coordination needs related to: Housing and services for the blind.   Engaged with patient Engaged with patient by telephone for follow up visit in response to provider referral for social work chronic care management and care coordination services.   Consent to Services:  Patient was given the following information about Chronic Care Management services today, agreed to services, and gave verbal consent: 1. CCM service includes personalized support from designated clinical staff supervised by primary care provider, including individualized plan of care and coordination with other care providers 2. 24/7 contact phone numbers for assistance for urgent and routine care needs. 3. Service will only be billed when office clinical staff spend 20 minutes or more in a month to coordinate care. 4. Only one practitioner may furnish and bill the service in a calendar month. 5.The patient may stop CCM services at any time (effective at the end of the month) by phone call to the office staff. 6. The patient will be responsible for cost sharing (co-pay) of up to 20% of the service fee (after annual deductible is met). Patient agreed to services and consent obtained.  Patient agreed to services and consent obtained.   Assessment/Interventions: Review of patient past medical history, allergies, medications, and health status, including review of relevant consultants reports was performed today as part of a comprehensive evaluation and provision of chronic care management and care coordination services.     SDOH (Social Determinants of Health) assessments and interventions performed:    Patient stated he did hear  from the lady that may have an opening for him. He was unable to get the details on the cost per month, she will contact him back. BSW contacted [facility] for the blind at [phone] and left a voicemail for a telephone call back.  Advanced Directives Status: Not addressed in this encounter.  CCM Care Plan  No Known Allergies  @[email or web address] @  Patient Active Problem List   Diagnosis Date Noted  . Healthcare maintenance 02/14/2020  . Weight loss, unintentional 11/10/2019  . History of prediabetes 11/10/2019  . GERD (gastroesophageal reflux disease) 12/22/2014  . Tobacco use disorder 12/22/2014  . Cannabis use disorder, severe, dependence ([place]) 12/22/2014  . Alcohol use disorder, mild, abuse 12/22/2014  . Schizoaffective disorder ([place]) 12/22/2014  . History of hepatitis C virus infection 09/01/2013    Conditions to be addressed/monitored per PCP order: n/a; Housing barriers  Patient Care Plan: CCM RN    Problem Identified: Hypertension (Hypertension)   Priority: High    Goal: Hypertension Monitored   Start Date: 11/15/2019  Expected End Date: 12/12/2020  This Visit's Progress: On track  Priority: High  Note:   CARE PLAN ENTRY (see longitudinal plan of care for additional care plan information)  Objective:  [name] BP Readings from Last 3 Encounters: .  03/07/20 . (!) 143/92 .  02/14/20 . (!) 167/103 .  11/09/19 . (!) 123/96   Lab Results  Component Value Date   CREATININE 1.06 11/09/2019   BUN 11 11/09/2019   NA 138 11/09/2019   K 4.1 11/09/2019   CL 103 11/09/2019   CO2 21 11/09/2019     Current Barriers:  [name] Knowledge Deficits  related to basic understanding of hypertension pathophysiology and self care management-  spoke with patient to assess self monitored blood pressures, patient states all readings are under 140/90,  reports he is taking his medications as prescribed because he now has a PCS aide, says his weight is stable, regarding his living  situation, he says he hopes to move in the near future and would like to talk with a Education officer, museum about options and payee issues   . -Case Manager Clinical Goal(s):  [name] Over the next 30-60 days, patient will demonstrate improved health management independence as evidenced by checking blood pressure as directed and notifying PCP if SBP>180 or DBP > 100 taking all medications as prescribe, and adhering to a low sodium diet as discussed. . Patient will complete appointment for chest CT- not met as of 04/27/20  Interventions:  . Evaluation of current treatment plan related to HTN and patient's adherence to plan as established by provider. . Reviewed medications with patient and discussed medication taking behavior . Reviewed home blood pressure readings and reviewed blood pressure targets . Positive reinforcement given to patient for home BP self monitoring . Encouraged patient to contact this CCM RN for questions or  concerns about BP self monitoring. . Discussed plans with patient for ongoing care management follow up and ensured he has the contact number for the CCM team and clinic . Per patient request will ask BSW [name] to contact patient about housing options and payee issues  Patient Self Care Activities:  . Self administers medications as prescribed . Attends all scheduled provider appointments . Calls provider office for new concerns, questions, or BP outside discussed parameters . Checks BP at least weekly and records as discussed . Follows a low sodium diet/DASH diet . follow-up on any referrals for help I am given . Reschedules CT of lung appointment . arrange a ride through an agency 1 week before appointment . - ask family or friend for a ride . - call to cancel if needed . - keep a calendar with appointment dates . - use [employer] [facility] for transportation       Follow Up Plan: SW will follow up with patient by phone over the next 30 days      [name], [place],  [place] Internal Medicine

## 2020-05-30 ENCOUNTER — Ambulatory Visit: Payer: Medicaid Other | Admitting: *Deleted

## 2020-05-30 DIAGNOSIS — R634 Abnormal weight loss: Secondary | ICD-10-CM

## 2020-05-30 DIAGNOSIS — F17200 Nicotine dependence, unspecified, uncomplicated: Secondary | ICD-10-CM

## 2020-05-30 DIAGNOSIS — F259 Schizoaffective disorder, unspecified: Secondary | ICD-10-CM

## 2020-05-30 DIAGNOSIS — I10 Essential (primary) hypertension: Secondary | ICD-10-CM

## 2020-05-30 NOTE — Patient Instructions (Signed)
Visit Information It was nice speaking with you today. Patient Care Plan: CCM RN- HTN    Problem Identified: Hypertension (Hypertension)   Priority: High    Goal: Hypertension Monitored   Start Date: 11/15/2019  Expected End Date: 12/12/2020  This Visit's Progress: On track  Priority: High  Note:   CARE PLAN ENTRY (see longitudinal plan of care for additional care plan information)  Objective:  [name] BP Readings from Last 3 Encounters: .  03/07/20 . (!) 143/92 .  02/14/20 . (!) 167/103 .  11/09/19 . (!) 123/96   Lab Results  Component Value Date   CREATININE 1.06 11/09/2019   BUN 11 11/09/2019   NA 138 11/09/2019   K 4.1 11/09/2019   CL 103 11/09/2019   CO2 21 11/09/2019     Current Barriers:  [name] Knowledge Deficits related to basic understanding of hypertension pathophysiology and self care management-  spoke with patient to assess self monitored blood pressures, patient states all readings are under 140/90,  reports he is taking his medications as prescribed because he now has a PCS aide, says his weight is stable, regarding his living situation, he says he hopes to move in the near future and would like to talk with a Education officer, museum about options and payee issues   . -Case Manager Clinical Goal(s):  [name] Over the next 30-60 days, patient will demonstrate improved health management independence as evidenced by checking blood pressure as directed and notifying PCP if SBP>180 or DBP > 100 taking all medications as prescribe, and adhering to a low sodium diet as discussed. . Patient will complete appointment for chest CT- not met as of 04/27/20  Interventions:  . Evaluation of current treatment plan related to HTN and patient's adherence to plan as established by provider. . Reviewed medications with patient and discussed medication taking behavior . Reviewed home blood pressure readings and reviewed blood pressure targets . Positive reinforcement given to patient for home BP self  monitoring . Encouraged patient to contact this CCM RN for questions or  concerns about BP self monitoring. . Discussed plans with patient for ongoing care management follow up and ensured he has the contact number for the CCM team and clinic . Per patient request will ask BSW [name] to contact patient about housing options and payee issues  Patient Self Care Activities:  . Self administers medications as prescribed . Attends all scheduled provider appointments . Calls provider office for new concerns, questions, or BP outside discussed parameters . Checks BP at least weekly and records as discussed . Follows a low sodium diet/DASH diet . follow-up on any referrals for help I am given . Reschedules CT of lung appointment . arrange a ride through an agency 1 week before appointment . - ask family or friend for a ride . - call to cancel if needed . - keep a calendar with appointment dates . - use [place] [facility] for transportation    Patient Care Plan: CCM RN- Anxiety (Adult)    Problem Identified: Symptoms (Anxiety)     Goal: Anxiety Symptoms Monitored and Managed   Start Date: 05/30/2020  This Visit's Progress: On track  Priority: High  Note:   Current Barriers:  . Care Coordination needs related to counseling services in a patient with HTN, Prediabetes, GERD, Schizoaffective Disorder, smokes, legally blind - patient state he is not doing well form a mental health perspective and wishes to speak with a counselor, he also says he wants to complete  the chest CT for lung cancer screening . Unable to independently manage behavioral health issues  Nurse Case Manager Clinical Goal(s):  [name] Over the next 30 days, patient will work with clinic behavioral health counselor  to address needs related to behavioral health issues  Interventions:  . 1:1 collaboration with Riesa Pope, MD regarding development and update of comprehensive plan of care as evidenced by provider  attestation and co-signature . Inter-disciplinary care team collaboration (see longitudinal plan of care) . Collaborated with provider regarding another referral to clinic integrated behavioral health counselor and to reorder chest CT (if needed) for lung cancer screening  Patient Goals/Self-Care Activities Over the next 30 days, patient will: keep initial appointment with counselor and at least 90% of all follow up counseling appointments s    Follow Up Plan: The care management team will reach out to the patient again over the next 30-60 days.          The patient verbalized understanding of instructions, educational materials, and care plan provided today and declined offer to receive copy of patient instructions, educational materials, and care plan.   The care management team will reach out to the patient again over the next 30-60 days.   Kelli Churn RN, CCM, [facility] RN Care Manager [phone]

## 2020-05-30 NOTE — Chronic Care Management (AMB) (Signed)
Care Management    RN Visit Note  05/30/2020 Name: Ethan [name] MRN: [id number] DOB: [birth year]  Subjective: Ethan [name] is a 55 y.o. year old male who is a primary care patient of Katsadouros, Candace Gallus, MD. The care management team was consulted for assistance with disease management and care coordination needs.    Engaged with patient by telephone for follow up visit in response to provider referral for case management and/or care coordination services.   Consent to Services:   Ethan [name] was given information about Care Management services today including:  1. Care Management services includes personalized support from designated clinical staff supervised by his physician, including individualized plan of care and coordination with other care providers 2. 24/7 contact phone numbers for assistance for urgent and routine care needs. 3. The patient may stop case management services at any time by phone call to the office staff.  Patient agreed to services and consent obtained.   Assessment: Review of patient past medical history, allergies, medications, health status, including review of consultants reports, laboratory and other test data, was performed as part of comprehensive evaluation and provision of chronic care management services.   SDOH (Social Determinants of Health) assessments and interventions performed:    Care Plan  No Known Allergies  Outpatient Encounter Medications as of 05/30/2020  Medication Sig  . amLODipine (NORVASC) 5 MG tablet Take 1 tablet (5 mg total) by mouth daily.  . Blood Pressure Monitoring (BLOOD PRESSURE KIT) DEVI Measure blood pressure twice a day  . hydrOXYzine (ATARAX/VISTARIL) 25 MG tablet Take 1 tablet (25 mg total) by mouth every 6 (six) hours as needed for anxiety.  . nicotine (NICODERM CQ - DOSED IN MG/24 HOURS) 21 mg/24hr patch Place 1 patch (21 mg total) onto the skin daily. (Patient not taking: Reported on 11/15/2019)  . pantoprazole  (PROTONIX) 40 MG tablet Take 1 tablet (40 mg total) by mouth daily.  . risperiDONE (RISPERDAL) 0.5 MG tablet Take 1 tablet (0.5 mg total) by mouth 2 (two) times daily.  . traZODone (DESYREL) 50 MG tablet Take 1 tablet (50 mg total) by mouth at bedtime and may repeat dose one time if needed.  . valsartan-hydrochlorothiazide (DIOVAN-HCT) 160-12.5 MG tablet Take 1 tablet by mouth daily.   No facility-administered encounter medications on file as of 05/30/2020.    Patient Active Problem List   Diagnosis Date Noted  . Healthcare maintenance 02/14/2020  . Weight loss, unintentional 11/10/2019  . History of prediabetes 11/10/2019  . GERD (gastroesophageal reflux disease) 12/22/2014  . Tobacco use disorder 12/22/2014  . Cannabis use disorder, severe, dependence ([place]) 12/22/2014  . Alcohol use disorder, mild, abuse 12/22/2014  . Schizoaffective disorder ([place]) 12/22/2014  . History of hepatitis C virus infection 09/01/2013    Conditions to be addressed/monitored: HTN, Prediabetes, GERD, Schizoaffective Disorder, smokes,  legally blind   Care Plan : CCM RN- HTN  Updates made by Barrington Ellison, RN since 05/30/2020 12:00 AM    Care Plan : CCM RN- Anxiety (Adult)  Updates made by Barrington Ellison, RN since 05/30/2020 12:00 AM    Problem: Symptoms (Anxiety)     Goal: Anxiety Symptoms Monitored and Managed   Start Date: 05/30/2020  This Visit's Progress: On track  Priority: High  Note:   Current Barriers:  . Care Coordination needs related to counseling services in a patient with HTN, Prediabetes, GERD, Schizoaffective Disorder, smokes, legally blind - patient states he is not doing well from a mental health  perspective and wishes to speak with a counselor, he also says he wants to complete the chest CT for lung cancer screening that was ordered in November, he says he is worried he may have lung cancer, he says he paid for transportation to take him to [place] twice for the procedure but was  told by CT staff that the order wasn't signed so he couldn't have the scan . Unable to independently manage behavioral health issues  Nurse Case Manager Clinical Goal(s):  [name] Over the next 30 days, patient will work with clinic behavioral health counselor  to address needs related to behavioral health issues  Interventions:  . 1:1 collaboration with Riesa Pope, MD regarding development and update of comprehensive plan of care as evidenced by provider attestation and co-signature . Inter-disciplinary care team collaboration (see longitudinal plan of care) . Collaborated with provider regarding another referral to clinic integrated behavioral health counselor and to reorder chest CT (if needed) for lung cancer screening  Patient Goals/Self-Care Activities Over the next 30 days, patient will: keep initial appointment with counselor and at least 90% of all follow up counseling appointments s    Follow Up Plan: The care management team will reach out to the patient again over the next 30-60 days.            Kelli Churn RN, CCM, [facility] RN Care Manager [phone]

## 2020-05-31 NOTE — Progress Notes (Signed)
Internal Medicine Clinic Resident  I have personally reviewed this encounter including the documentation in this note and/or discussed this patient with the care management provider. I will address any urgent items identified by the care management provider and will communicate my actions to the patient's PCP. I have reviewed the patient's CCM visit with my supervising attending, Dr Daryll Drown.  New order placed for chest ct w/ contrast for weight loss. Evaluated by Dr. Court Joy in 02/2020 and order was placed, but patient states he had difficulty getting imaging done.   Order placed for IBH.  Sanjuana Letters, MD 05/31/2020

## 2020-05-31 NOTE — Addendum Note (Signed)
Addended by: Riesa Pope on: 05/31/2020 07:44 AM   Modules accepted: Orders

## 2020-05-31 NOTE — Progress Notes (Signed)
Internal Medicine Clinic Attending  CCM services provided by the care management provider and their documentation were discussed with Dr. Johnney Ou. We reviewed the pertinent findings, urgent action items addressed by the resident and non-urgent items to be addressed by the PCP.  I agree with the assessment, diagnosis, and plan of care documented in the CCM and resident's note.  Gilles Chiquito, MD 05/31/2020

## 2020-06-07 ENCOUNTER — Ambulatory Visit: Payer: Self-pay

## 2020-06-07 DIAGNOSIS — I10 Essential (primary) hypertension: Secondary | ICD-10-CM

## 2020-06-07 DIAGNOSIS — F259 Schizoaffective disorder, unspecified: Secondary | ICD-10-CM

## 2020-06-07 NOTE — Patient Instructions (Signed)
Visit Information  PATIENT GOALS: Goals Addressed   None     The patient verbalized understanding of instructions, educational materials, and care plan provided today and agreed to receive a mailed copy of patient instructions, educational materials, and care plan.   The care management team will reach out to the patient again over the next 2 days.  [name], Arita Miss, [facility]

## 2020-06-07 NOTE — Chronic Care Management (AMB) (Signed)
Chronic Care Management    Social Work Note  06/07/2020 Name: Ethan [name] MRN: [id number] DOB: 1966/01/12  Ethan [name] is a 55 y.o. year old male who is a primary care patient of Katsadouros, Candace Gallus, MD. The CCM team was consulted to assist the patient with chronic disease management and/or care coordination needs related to: [employer] .   Engaged with patient Engaged with patient by telephone for patient contacted BSW in response to provider referral for social work chronic care management and care coordination services.   Consent to Services:  Patient was given the following information about Chronic Care Management services today, agreed to services, and gave verbal consent: 1. CCM service includes personalized support from designated clinical staff supervised by primary care provider, including individualized plan of care and coordination with other care providers 2. 24/7 contact phone numbers for assistance for urgent and routine care needs. 3. Service will only be billed when office clinical staff spend 20 minutes or more in a month to coordinate care. 4. Only one practitioner may furnish and bill the service in a calendar month. 5.The patient may stop CCM services at any time (effective at the end of the month) by phone call to the office staff. 6. The patient will be responsible for cost sharing (co-pay) of up to 20% of the service fee (after annual deductible is met). Patient agreed to services and consent obtained.  Patient agreed to services and consent obtained.   Assessment/Interventions: Review of patient past medical history, allergies, medications, and health status, including review of relevant consultants reports was performed today as part of a comprehensive evaluation and provision of chronic care management and care coordination services.     SDOH (Social Determinants of Health) assessments and interventions performed:    Patient contacted BSW stating he  has to move as soon as possible and no one wants to help him. Patient stated before he found somewhere to move to he wanted to make sure there was someone that could help him with his light and water bill because he is poor. Patient stated he has not heard anything from Social Services about blind services and no one wants to help him. He states he wants to find a new payee because his current payee only comes around when he gets his money and foodstamps and is making him feel unappreciated. BSW asked patient if he had an S/I or H/I he stated "yall always trying to lock me up, I am fine". Patient stated he would like to go to a boarding house or somewhere where he can get a piece of mind.  Advanced Directives Status: Not addressed in this encounter.  CCM Care Plan  No Known Allergies  _0 @  Patient Active Problem List   Diagnosis Date Noted  . Healthcare maintenance 02/14/2020  . Weight loss, unintentional 11/10/2019  . History of prediabetes 11/10/2019  . GERD (gastroesophageal reflux disease) 12/22/2014  . Tobacco use disorder 12/22/2014  . Cannabis use disorder, severe, dependence ([place]) 12/22/2014  . Alcohol use disorder, mild, abuse 12/22/2014  . Schizoaffective disorder ([place]) 12/22/2014  . History of hepatitis C virus infection 09/01/2013    Conditions to be addressed/monitored per PCP order: Bipolar Disorder; Housing barriers  Patient Care Plan: CCM RN- HTN    Problem Identified: Hypertension (Hypertension)   Priority: High    Goal: Hypertension Monitored   Start Date: 11/15/2019  Expected End Date: 12/12/2020  This Visit's Progress: On track  Priority: High  Note:  CARE PLAN ENTRY (see longitudinal plan of care for additional care plan information)  Objective:  [name] BP Readings from Last 3 Encounters: .  03/07/20 . (!) 143/92 .  02/14/20 . (!) 167/103 .  11/09/19 . (!) 123/96   Lab Results  Component Value Date   CREATININE 1.06 11/09/2019   BUN 11  11/09/2019   NA 138 11/09/2019   K 4.1 11/09/2019   CL 103 11/09/2019   CO2 21 11/09/2019     Current Barriers:  [name] Knowledge Deficits related to basic understanding of hypertension pathophysiology and self care management-  spoke with patient to assess self monitored blood pressures, patient states all readings are under 140/90,  reports he is taking his medications as prescribed because he now has a PCS aide, says his weight is stable, regarding his living situation, he says he hopes to move in the near future and would like to talk with a Education officer, museum about options and payee issues   . -Case Manager Clinical Goal(s):  [name] Over the next 30-60 days, patient will demonstrate improved health management independence as evidenced by checking blood pressure as directed and notifying PCP if SBP>180 or DBP > 100 taking all medications as prescribe, and adhering to a low sodium diet as discussed. . Patient will complete appointment for chest CT- not met as of 04/27/20  Interventions:  . Evaluation of current treatment plan related to HTN and patient's adherence to plan as established by provider. . Reviewed medications with patient and discussed medication taking behavior . Reviewed home blood pressure readings and reviewed blood pressure targets . Positive reinforcement given to patient for home BP self monitoring . Encouraged patient to contact this CCM RN for questions or  concerns about BP self monitoring. . Discussed plans with patient for ongoing care management follow up and ensured he has the contact number for the CCM team and clinic . Per patient request will ask BSW [name] to contact patient about housing options and payee issues  Patient Self Care Activities:  . Self administers medications as prescribed . Attends all scheduled provider appointments . Calls provider office for new concerns, questions, or BP outside discussed parameters . Checks BP at least weekly and records as  discussed . Follows a low sodium diet/DASH diet . follow-up on any referrals for help I am given . Reschedules CT of lung appointment . arrange a ride through an agency 1 week before appointment . - ask family or friend for a ride . - call to cancel if needed . - keep a calendar with appointment dates . - use [employer] for transportation    Patient Care Plan: CCM RN- Anxiety (Adult)    Problem Identified: Symptoms (Anxiety)     Goal: Anxiety Symptoms Monitored and Managed   Start Date: 05/30/2020  This Visit's Progress: On track  Priority: High  Note:   Current Barriers:  . Care Coordination needs related to counseling services in a patient with HTN, Prediabetes, GERD, Schizoaffective Disorder, smokes, legally blind - patient state he is not doing well form a mental health perspective and wishes to speak with a counselor, he also says he wants to complete the chest CT for lung cancer screening . Unable to independently manage behavioral health issues  Nurse Case Manager Clinical Goal(s):  [name] Over the next 30 days, patient will work with clinic behavioral health counselor  to address needs related to behavioral health issues  Interventions:  . 1:1 collaboration with Johnney Ou, Vasilios,  MD regarding development and update of comprehensive plan of care as evidenced by provider attestation and co-signature . Inter-disciplinary care team collaboration (see longitudinal plan of care) . Collaborated with provider regarding another referral to clinic integrated behavioral health counselor and to reorder chest CT (if needed) for lung cancer screening  Patient Goals/Self-Care Activities Over the next 30 days, patient will: keep initial appointment with counselor and at least 90% of all follow up counseling appointments s    Follow Up Plan: The care management team will reach out to the patient again over the next 30-60 days.           Follow Up Plan: Appointment scheduled for  SW follow up with client by phone on: 06/09/20      [name], [place], [place] Medicaid Team

## 2020-06-13 ENCOUNTER — Telehealth: Payer: Self-pay | Admitting: Behavioral Health

## 2020-06-13 ENCOUNTER — Ambulatory Visit (HOSPITAL_COMMUNITY): Admission: RE | Admit: 2020-06-13 | Payer: Medicaid Other | Source: Ambulatory Visit

## 2020-06-13 NOTE — Telephone Encounter (Signed)
Contacted Pt for IBH Referral. Put Pt on Clinician's schedule for 06/14/2020 w/Pt agreement.  Dr. Theodis Shove

## 2020-06-14 ENCOUNTER — Ambulatory Visit: Payer: Self-pay

## 2020-06-14 ENCOUNTER — Telehealth: Payer: Self-pay | Admitting: Behavioral Health

## 2020-06-14 ENCOUNTER — Institutional Professional Consult (permissible substitution): Payer: Medicaid Other | Admitting: Behavioral Health

## 2020-06-14 ENCOUNTER — Other Ambulatory Visit: Payer: Self-pay

## 2020-06-14 NOTE — Chronic Care Management (AMB) (Signed)
Chronic Care Management    Social Work Note  06/14/2020 Name: Ethan [name] MRN: [id number] DOB: [birth year]  Ethan [name] is a 55 y.o. year old male who is a primary care patient of Katsadouros, Candace Gallus, MD. The CCM team was consulted to assist the patient with chronic disease management and/or care coordination needs related to: [employer] .   Engaged with patient Engaged with patient by telephone for follow up visit in response to provider referral for social work chronic care management and care coordination services.   Consent to Services:  Patient was given the following information about Chronic Care Management services today, agreed to services, and gave verbal consent: 1. CCM service includes personalized support from designated clinical staff supervised by primary care provider, including individualized plan of care and coordination with other care providers 2. 24/7 contact phone numbers for assistance for urgent and routine care needs. 3. Service will only be billed when office clinical staff spend 20 minutes or more in a month to coordinate care. 4. Only one practitioner may furnish and bill the service in a calendar month. 5.The patient may stop CCM services at any time (effective at the end of the month) by phone call to the office staff. 6. The patient will be responsible for cost sharing (co-pay) of up to 20% of the service fee (after annual deductible is met). Patient agreed to services and consent obtained.  Patient agreed to services and consent obtained.   Assessment/Interventions: Review of patient past medical history, allergies, medications, and health status, including review of relevant consultants reports was performed today as part of a comprehensive evaluation and provision of chronic care management and care coordination services.     SDOH (Social Determinants of Health) assessments and interventions performed:    BSW received a telephone call from  patient stating that he was ready to move into a facility. He has been at his home by himself for a week and he feels as though Ms. Laurance Flatten is playing with him and his mental health. Patient stated he just wanted to talk to someone before he does something he does not want to do. Patient stated he keeps calling around asking for help but no one wants to help. He states he contacted the crisis line and the lady was sleep and did not want to help. Patient stated he called Ms. Shanda Bumps and she stated she would call him back. BSW contacted Ms. Forman at [phone], she stated that patient flip flops on going into an assisted living facility, she currently does not have any boarding rooms at this time. BSW contacted [facility] boarding house and [place] and left a voicemail.  BSW contacted Baird Kay with services for the blind [phone] and left a voicemail. Advanced Directives Status: Not addressed in this encounter.  CCM Care Plan  No Known Allergies  @[email or web address] @  Patient Active Problem List   Diagnosis Date Noted  . Healthcare maintenance 02/14/2020  . Weight loss, unintentional 11/10/2019  . History of prediabetes 11/10/2019  . GERD (gastroesophageal reflux disease) 12/22/2014  . Tobacco use disorder 12/22/2014  . Cannabis use disorder, severe, dependence ([place]) 12/22/2014  . Alcohol use disorder, mild, abuse 12/22/2014  . Schizoaffective disorder ([place]) 12/22/2014  . History of hepatitis C virus infection 09/01/2013    Conditions to be addressed/monitored per PCP order: housing; Housing barriers  Patient Care Plan: CCM RN- HTN    Problem Identified: Hypertension (Hypertension)   Priority: High    Goal:  Hypertension Monitored   Start Date: 11/15/2019  Expected End Date: 12/12/2020  This Visit's Progress: On track  Priority: High  Note:   CARE PLAN ENTRY (see longitudinal plan of care for additional care plan information)  Objective:  [name] BP Readings from Last 3  Encounters: .  03/07/20 . (!) 143/92 .  02/14/20 . (!) 167/103 .  11/09/19 . (!) 123/96   Lab Results  Component Value Date   CREATININE 1.06 11/09/2019   BUN 11 11/09/2019   NA 138 11/09/2019   K 4.1 11/09/2019   CL 103 11/09/2019   CO2 21 11/09/2019     Current Barriers:  [name] Knowledge Deficits related to basic understanding of hypertension pathophysiology and self care management-  spoke with patient to assess self monitored blood pressures, patient states all readings are under 140/90,  reports he is taking his medications as prescribed because he now has a PCS aide, says his weight is stable, regarding his living situation, he says he hopes to move in the near future and would like to talk with a Education officer, museum about options and payee issues   . -Case Manager Clinical Goal(s):  [name] Over the next 30-60 days, patient will demonstrate improved health management independence as evidenced by checking blood pressure as directed and notifying PCP if SBP>180 or DBP > 100 taking all medications as prescribe, and adhering to a low sodium diet as discussed. . Patient will complete appointment for chest CT- not met as of 04/27/20  Interventions:  . Evaluation of current treatment plan related to HTN and patient's adherence to plan as established by provider. . Reviewed medications with patient and discussed medication taking behavior . Reviewed home blood pressure readings and reviewed blood pressure targets . Positive reinforcement given to patient for home BP self monitoring . Encouraged patient to contact this CCM RN for questions or  concerns about BP self monitoring. . Discussed plans with patient for ongoing care management follow up and ensured he has the contact number for the CCM team and clinic . Per patient request will ask BSW [name] to contact patient about housing options and payee issues  Patient Self Care Activities:  . Self administers medications as prescribed . Attends  all scheduled provider appointments . Calls provider office for new concerns, questions, or BP outside discussed parameters . Checks BP at least weekly and records as discussed . Follows a low sodium diet/DASH diet . follow-up on any referrals for help I am given . Reschedules CT of lung appointment . arrange a ride through an agency 1 week before appointment . - ask family or friend for a ride . - call to cancel if needed . - keep a calendar with appointment dates . - use [employer] for transportation    Patient Care Plan: CCM RN- Anxiety (Adult)    Problem Identified: Symptoms (Anxiety)     Goal: Anxiety Symptoms Monitored and Managed   Start Date: 05/30/2020  This Visit's Progress: On track  Priority: High  Note:   Current Barriers:  . Care Coordination needs related to counseling services in a patient with HTN, Prediabetes, GERD, Schizoaffective Disorder, smokes, legally blind - patient state he is not doing well form a mental health perspective and wishes to speak with a counselor, he also says he wants to complete the chest CT for lung cancer screening . Unable to independently manage behavioral health issues  Nurse Case Manager Clinical Goal(s):  [name] Over the next 30 days, patient  will work with clinic behavioral health counselor  to address needs related to behavioral health issues  Interventions:  . 1:1 collaboration with Riesa Pope, MD regarding development and update of comprehensive plan of care as evidenced by provider attestation and co-signature . Inter-disciplinary care team collaboration (see longitudinal plan of care) . Collaborated with provider regarding another referral to clinic integrated behavioral health counselor and to reorder chest CT (if needed) for lung cancer screening  Patient Goals/Self-Care Activities Over the next 30 days, patient will: keep initial appointment with counselor and at least 90% of all follow up counseling appointments  s    Follow Up Plan: The care management team will reach out to the patient again over the next 30-60 days.           Follow Up Plan: no follow up required at this time      [name], BSW, [place] Medicaid Team

## 2020-06-14 NOTE — Telephone Encounter (Signed)
Attempted to initiate session today w/Pt btwn 12-12:30pm. Lft 3 msg for Pt regarding call. Pt clarified to use his cell phone for call when we spoke briefly on Tue. This number is [phone]. Pt acknowledged call would be on Wed at [place].  Dr. Theodis Shove

## 2020-06-20 ENCOUNTER — Ambulatory Visit: Payer: Medicaid Other | Admitting: Behavioral Health

## 2020-06-20 ENCOUNTER — Other Ambulatory Visit: Payer: Self-pay

## 2020-06-20 DIAGNOSIS — F259 Schizoaffective disorder, unspecified: Secondary | ICD-10-CM

## 2020-06-20 NOTE — BH Specialist Note (Signed)
Integrated Behavioral Health via Telemedicine Visit  06/20/2020 Ethan [name] [id number]  Number of [place] visits: 1/6 Session Start time: 1:30pm  Session End time: 2:00pm Total time: 30  Referring Provider: Dr. Johnney Ou, MD Patient/Family location: Pt at home in private [facility] Provider location: [facility] Office All persons participating in visit: Pt & Clinician Types of Service: Individual psychotherapy  I connected with Ethan [name] and/or Ethan [name]'s self via  Telephone or Video Enabled Telemedicine Application  (Video is Caregility application) and verified that I am speaking with the correct person using two identifiers. Discussed confidentiality: Yes   I discussed the limitations of telemedicine and the availability of in person appointments.  Discussed there is a possibility of technology failure and discussed alternative modes of communication if that failure occurs.  I discussed that engaging in this telemedicine visit, they consent to the provision of behavioral healthcare and the services will be billed under their insurance.  Patient and/or legal guardian expressed understanding and consented to Telemedicine visit: Yes   Presenting Concerns: Patient and/or family reports the following symptoms/concerns: inc'd anxiety for housing situation w/HUD; Pt aware that others are trying to help him secure housing away from his Payee, [name] as her Sons come to the residence unexpectedly & wreak some type of "havoc" Duration of problem: months; Severity of problem: moderate  Patient and/or Family's Strengths/Protective Factors: Pt is a legally blind person who feels isolated at home of his own dic-mkg, but for his mental health wellness. Pt has certain degree of paranoia & skepticism of others due to Dx. Pt expressed, "I just want my peace & ppl have been working on this for months w/no results." Humor!  Goals Addressed: Patient will: 1.  Reduce  symptoms of: anxiety, mood instability and stress  2.  Increase knowledge and/or ability of: coping skills and stress reduction  3.  Demonstrate ability to: Increase healthy adjustment to current life circumstances and Increase adequate support systems for patient/family  Progress towards Goals: Ongoing  Interventions: Interventions utilized:  Solution-Focused Strategies and Supportive Counseling Standardized Assessments completed: Not Needed  Patient and/or Family Response: Pt receptive to call today & answered quickly. Pt secured a new phone recently. Pt sts, "stable housing will help his mental health tremendously." He can have some peace & know who is coming & going. Pt is, "trying my best to do my best. My Hx of time in prison is a real hindrance to me finding & keeping employment."  Assessment: Patient currently experiencing Pt was raised in an ETOH family; time in gangs, going in & out of shelters & [employer]. Pt expressed, "I grew up in angry situations & it made me mean."  Pt has grown futile in his belief he will find secure housing. Assured Pt we will get things moving. Pt has resources in case he gets in a crisis. Pt uses humor to cope. He has a strong spiritual life & talks to G_d often.   Patient may benefit from housing security & regular check-ins for mental wellness.  Plan: 1. Follow up with behavioral health clinician on : next week a/o if news of housing comes first. Contacted Kelli Churn, RN who reached out to [employer] for addt'l resources new to the [place]. 2. Behavioral recommendations: Cont your use of coping skills & try not to isolate too much. Take the alternative perspectives given today & put into practice. 3. Referral(s): [place] (In Clinic)  I  discussed the assessment and treatment plan with the patient and/or parent/guardian. They were provided an opportunity to ask questions and all were answered. They  agreed with the plan and demonstrated an understanding of the instructions.   They were advised to call back or seek an in-person evaluation if the symptoms worsen or if the condition fails to improve as anticipated.  Donnetta Hutching, LMFT

## 2020-06-27 ENCOUNTER — Telehealth: Payer: Self-pay

## 2020-06-27 ENCOUNTER — Telehealth: Payer: Self-pay | Admitting: *Deleted

## 2020-06-27 ENCOUNTER — Other Ambulatory Visit: Payer: Self-pay

## 2020-06-27 ENCOUNTER — Telehealth: Payer: Medicaid Other

## 2020-06-27 MED ORDER — PANTOPRAZOLE SODIUM 40 MG PO TBEC: 40.0000 mg | DELAYED_RELEASE_TABLET | Freq: Every day | ORAL | 1 refills | Status: DC

## 2020-06-27 NOTE — Telephone Encounter (Signed)
Need refill on pantoprazole (PROTONIX) 40 MG tablet  ;pt contact [phone]   [facility] [phone] - [place], [address] AT [address]   Pt been out of medicine for 3 days

## 2020-06-27 NOTE — Telephone Encounter (Addendum)
  Care Management   Outreach Note  06/27/2020 Name: Ethan [name] MRN: [id number] DOB: [birth year]  Referred by: Riesa Pope, MD Reason for referral : Care Coordination (HTN, Prediabetes, GERD, Schizoaffective Disorder,)   Attempted to reach patient again after he called clinic in response to missed call from this CCM RN. Left message with name and contact number for BSW [name] at [phone] since this CCM RN will not return to work again until 3/18.  The patient was referred to the case management team for assistance with care management and care coordination and housing insecurity.   Follow Up Plan: The care management team will reach out to the patient again over the next 7-14 days.   Kelli Churn RN, CCM, [facility] RN Care Manager [phone]

## 2020-06-27 NOTE — Telephone Encounter (Signed)
  Care Management   Outreach Note  06/27/2020 Name: Ethan [name] MRN: [id number] DOB: 1965-05-06  Referred by: Riesa Pope, MD Reason for referral : Care Coordination (HTN, Prediabetes, GERD, Schizoaffective Disorder, smokes, legally blind )   An unsuccessful telephone outreach was attempted today. The patient was referred to the case management team for assistance with care management and care coordination.   Follow Up Plan: A HIPAA compliant phone message was left for the patient providing contact information and requesting a return call.  The care management team will reach out to the patient again over the next 7-14 days.   Kelli Churn RN, CCM, [facility] RN Care Manager [phone]

## 2020-06-27 NOTE — Telephone Encounter (Signed)
Pt is calling back to speak with you. Please call back.

## 2020-06-28 ENCOUNTER — Telehealth: Payer: Self-pay

## 2020-06-28 NOTE — Telephone Encounter (Signed)
Pt states he cannot get  pantoprazole (PROTONIX) 40 MG tablet at the pharmacy because it's not due until 07/03/2020. Pt states he has no med for 4 days, requesting the med to be filled by today. Please call pt back.

## 2020-06-28 NOTE — Telephone Encounter (Signed)
RTC, informed patient that Dr. Collene Gobble sent a RX in for Protonix #90 w/ 1 rf on 06/27/20.  Informed patient if pharmacy told him it was too early for refill, that was between patient and insurance company, encouraged pt to call pharmacist or insurance company. SChaplin, RN,BSN

## 2020-07-11 ENCOUNTER — Ambulatory Visit: Payer: Medicaid Other | Admitting: Behavioral Health

## 2020-07-11 ENCOUNTER — Ambulatory Visit: Payer: Medicaid Other | Admitting: *Deleted

## 2020-07-11 DIAGNOSIS — F17200 Nicotine dependence, unspecified, uncomplicated: Secondary | ICD-10-CM

## 2020-07-11 DIAGNOSIS — F259 Schizoaffective disorder, unspecified: Secondary | ICD-10-CM

## 2020-07-11 DIAGNOSIS — I10 Essential (primary) hypertension: Secondary | ICD-10-CM

## 2020-07-11 DIAGNOSIS — Z87898 Personal history of other specified conditions: Secondary | ICD-10-CM

## 2020-07-11 NOTE — Chronic Care Management (AMB) (Signed)
Care Management    RN Visit Note  07/11/2020 Name: Ethan [name] MRN: [id number] DOB: August 08, 1965  Subjective: Ethan [name] is a 55 y.o. year old male who is a primary care patient of Katsadouros, Candace Gallus, MD. The care management team was consulted for assistance with disease management and care coordination needs.    Engaged with patient by telephone for follow up visit in response to provider referral for case management and/or care coordination services.   Consent to Services:   Ethan [name] was given information about Care Management services today including:  1. Care Management services includes personalized support from designated clinical staff supervised by his physician, including individualized plan of care and coordination with other care providers 2. 24/7 contact phone numbers for assistance for urgent and routine care needs. 3. The patient may stop case management services at any time by phone call to the office staff.  Patient agreed to services and consent obtained.   Assessment: Review of patient past medical history, allergies, medications, health status, including review of consultants reports, laboratory and other test data, was performed as part of comprehensive evaluation and provision of chronic care management services.   SDOH (Social Determinants of Health) assessments and interventions performed:    Care Plan  No Known Allergies  Outpatient Encounter Medications as of 07/11/2020  Medication Sig  . amLODipine (NORVASC) 5 MG tablet Take 1 tablet (5 mg total) by mouth daily.  . Blood Pressure Monitoring (BLOOD PRESSURE KIT) DEVI Measure blood pressure twice a day  . hydrOXYzine (ATARAX/VISTARIL) 25 MG tablet Take 1 tablet (25 mg total) by mouth every 6 (six) hours as needed for anxiety.  . nicotine (NICODERM CQ - DOSED IN MG/24 HOURS) 21 mg/24hr patch Place 1 patch (21 mg total) onto the skin daily. (Patient not taking: Reported on 11/15/2019)  . pantoprazole  (PROTONIX) 40 MG tablet Take 1 tablet (40 mg total) by mouth daily.  . risperiDONE (RISPERDAL) 0.5 MG tablet Take 1 tablet (0.5 mg total) by mouth 2 (two) times daily.  . traZODone (DESYREL) 50 MG tablet Take 1 tablet (50 mg total) by mouth at bedtime and may repeat dose one time if needed.  . valsartan-hydrochlorothiazide (DIOVAN-HCT) 160-12.5 MG tablet Take 1 tablet by mouth daily.   No facility-administered encounter medications on file as of 07/11/2020.    Patient Active Problem List   Diagnosis Date Noted  . Healthcare maintenance 02/14/2020  . Weight loss, unintentional 11/10/2019  . History of prediabetes 11/10/2019  . GERD (gastroesophageal reflux disease) 12/22/2014  . Tobacco use disorder 12/22/2014  . Cannabis use disorder, severe, dependence ([place]) 12/22/2014  . Alcohol use disorder, mild, abuse 12/22/2014  . Schizoaffective disorder ([place]) 12/22/2014  . History of hepatitis C virus infection 09/01/2013    Conditions to be addressed/monitored: HTN, Prediabetes, GERD, Schizoaffective Disorder, smokes, legally blind   Care Plan : CCM RN- HTN  Updates made by Barrington Ellison, RN since 07/11/2020 12:00 AM    Problem: Hypertension (Hypertension)   Priority: High    Goal: Hypertension Monitored   Start Date: 11/15/2019  Expected End Date: 12/12/2020  Recent Progress: On track  Priority: High  Note:   CARE PLAN ENTRY (see longitudinal plan of care for additional care plan information)  Objective:  [name] BP Readings from Last 3 Encounters: .  03/07/20 . (!) 143/92 .  02/14/20 . (!) 167/103 .  11/09/19 . (!) 123/96   Lab Results  Component Value Date   CREATININE 1.06 11/09/2019  BUN 11 11/09/2019   NA 138 11/09/2019   K 4.1 11/09/2019   CL 103 11/09/2019   CO2 21 11/09/2019     Current Barriers:  [name] Knowledge Deficits related to basic understanding of hypertension pathophysiology and self care management-  spoke with patient to assess self monitored blood pressures,  says he is feeling good mentally and physically, patient states all readings are under 140/90,  reports he has all prescribed medicines and is taking his medications as prescribed because he now has a PCS aide, says his weight is stable  . -Case Manager Clinical Goal(s):  [name] Over the next 30-60 days, patient will demonstrate improved health management independence as evidenced by checking blood pressure as directed and notifying PCP if SBP>180 or DBP > 100 taking all medications as prescribe, and adhering to a low sodium diet as discussed. . Patient will complete appointment for chest CT- not met as of 04/27/20  Interventions:  . Evaluation of current treatment plan related to HTN and patient's adherence to plan as established by provider. . Reviewed medications with patient and discussed medication taking behavior . Reviewed home blood pressure readings and reviewed blood pressure targets . Positive reinforcement given to patient for home BP self monitoring . Encouraged patient to contact this CCM RN for questions or  concerns about BP self monitoring. . Discussed plans with patient for ongoing care management follow up and ensured he has the contact number for the CCM team and clinic  Patient Self Care Activities:  . Self administers medications as prescribed . Attends all scheduled provider appointments . Calls provider office for new concerns, questions, or BP outside discussed parameters . Checks BP at least weekly and records as discussed . Follows a low sodium diet/DASH diet . follow-up on any referrals for help I am given . Reschedules CT of lung appointment . arrange a ride through an agency 1 week before appointment . - ask family or friend for a ride . - call to cancel if needed . - keep a calendar with appointment dates . - use [place] [facility] for transportation    Care Plan : CCM RN- Anxiety (Adult)  Updates made by Barrington Ellison, RN since 07/11/2020 12:00 AM    Problem:  Symptoms (Anxiety)     Goal: Anxiety Symptoms Monitored and Managed   Start Date: 05/30/2020  Recent Progress: On track  Priority: High  Note:   Current Barriers:  . Care Coordination needs related to counseling services in a patient with HTN, Prediabetes, GERD, Schizoaffective Disorder, smokes, legally blind - patient states he is doing well from a mental health perspective, says he completed his first session with the clinic counselor on 06/20/20 . Unable to independently manage behavioral health issues  Nurse Case Manager Clinical Goal(s):  [name] Over the next 30 days, patient will work with clinic behavioral health counselor  to address needs related to behavioral health issues  Interventions:  . 1:1 collaboration with Riesa Pope, MD regarding development and update of comprehensive plan of care as evidenced by provider attestation and co-signature . Inter-disciplinary care team collaboration (see longitudinal plan of care) . Positive reinforcement given to patient for keeping counseling appointment with clinic counselor  Patient Goals/Self-Care Activities Over the next 30 days, patient will: keep initial appointment with counselor and at least 90% of all follow up counseling appointments s    Follow Up Plan: The care management team will reach out to the patient again over the next 30-60 days.  Kelli Churn RN, CCM, [facility] RN Care Manager [phone]

## 2020-07-11 NOTE — Patient Instructions (Signed)
Visit Information It was nice speaking with you today. Patient Care Plan: CCM RN- HTN    Problem Identified: Hypertension (Hypertension)   Priority: High    Goal: Hypertension Monitored   Start Date: 11/15/2019  Expected End Date: 12/12/2020  Recent Progress: On track  Priority: High  Note:   CARE PLAN ENTRY (see longitudinal plan of care for additional care plan information)  Objective:  [name] BP Readings from Last 3 Encounters: .  03/07/20 . (!) 143/92 .  02/14/20 . (!) 167/103 .  11/09/19 . (!) 123/96   Lab Results  Component Value Date   CREATININE 1.06 11/09/2019   BUN 11 11/09/2019   NA 138 11/09/2019   K 4.1 11/09/2019   CL 103 11/09/2019   CO2 21 11/09/2019     Current Barriers:  [name] Knowledge Deficits related to basic understanding of hypertension pathophysiology and self care management-  spoke with patient to assess self monitored blood pressures, says he is feeling good mentally and physically, patient states all readings are under 140/90,  reports he has all prescribed medicines and is taking his medications as prescribed because he now has a PCS aide, says his weight is stable  . -Case Manager Clinical Goal(s):  [name] Over the next 30-60 days, patient will demonstrate improved health management independence as evidenced by checking blood pressure as directed and notifying PCP if SBP>180 or DBP > 100 taking all medications as prescribe, and adhering to a low sodium diet as discussed. . Patient will complete appointment for chest CT- not met as of 04/27/20  Interventions:  . Evaluation of current treatment plan related to HTN and patient's adherence to plan as established by provider. . Reviewed medications with patient and discussed medication taking behavior . Reviewed home blood pressure readings and reviewed blood pressure targets . Positive reinforcement given to patient for home BP self monitoring . Encouraged patient to contact this CCM RN for questions or  concerns  about BP self monitoring. . Discussed plans with patient for ongoing care management follow up and ensured he has the contact number for the CCM team and clinic  Patient Self Care Activities:  . Self administers medications as prescribed . Attends all scheduled provider appointments . Calls provider office for new concerns, questions, or BP outside discussed parameters . Checks BP at least weekly and records as discussed . Follows a low sodium diet/DASH diet . follow-up on any referrals for help I am given . Reschedules CT of lung appointment . arrange a ride through an agency 1 week before appointment . - ask family or friend for a ride . - call to cancel if needed . - keep a calendar with appointment dates . - use [place] [facility] for transportation    Patient Care Plan: CCM RN- Anxiety (Adult)    Problem Identified: Symptoms (Anxiety)     Goal: Anxiety Symptoms Monitored and Managed   Start Date: 05/30/2020  Recent Progress: On track  Priority: High  Note:   Current Barriers:  . Care Coordination needs related to counseling services in a patient with HTN, Prediabetes, GERD, Schizoaffective Disorder, smokes, legally blind - patient states he is doing well from a mental health perspective, says he completed his first session with the clinic counselor on 06/20/20 . Unable to independently manage behavioral health issues  Nurse Case Manager Clinical Goal(s):  [name] Over the next 30 days, patient will work with clinic behavioral health counselor  to address needs related to behavioral health issues  Interventions:  . 1:1 collaboration with Riesa Pope, MD regarding development and update of comprehensive plan of care as evidenced by provider attestation and co-signature . Inter-disciplinary care team collaboration (see longitudinal plan of care) . Positive reinforcement given to patient for keeping counseling appointment with clinic counselor  Patient Goals/Self-Care  Activities Over the next 30 days, patient will: keep initial appointment with counselor and at least 90% of all follow up counseling appointments s    Follow Up Plan: The care management team will reach out to the patient again over the next 30-60 days.          The patient verbalized understanding of instructions, educational materials, and care plan provided today and declined offer to receive copy of patient instructions, educational materials, and care plan.     Kelli Churn RN, CCM, [facility] RN Care Manager [phone]

## 2020-08-06 ENCOUNTER — Other Ambulatory Visit: Payer: Self-pay

## 2020-08-06 ENCOUNTER — Ambulatory Visit (HOSPITAL_COMMUNITY)
Admission: EM | Admit: 2020-08-06 | Discharge: 2020-08-07 | Disposition: A | Discharge status: Home / Self Care | Payer: Medicaid Other | Attending: Family | Admitting: Family

## 2020-08-06 DIAGNOSIS — F251 Schizoaffective disorder, depressive type: Secondary | ICD-10-CM | POA: Insufficient documentation

## 2020-08-06 DIAGNOSIS — Z20822 Contact with and (suspected) exposure to covid-19: Secondary | ICD-10-CM | POA: Diagnosis not present

## 2020-08-06 DIAGNOSIS — F1220 Cannabis dependence, uncomplicated: Secondary | ICD-10-CM

## 2020-08-06 DIAGNOSIS — F1010 Alcohol abuse, uncomplicated: Secondary | ICD-10-CM

## 2020-08-06 LAB — POC SARS CORONAVIRUS 2 AG -  ED: SARS Coronavirus 2 Ag: NEGATIVE

## 2020-08-06 LAB — POCT URINE DRUG SCREEN - MANUAL ENTRY (I-SCREEN)
POC Amphetamine UR: NOT DETECTED
POC Buprenorphine (BUP): NOT DETECTED
POC Cocaine UR: POSITIVE — AB
POC Marijuana UR: POSITIVE — AB
POC Methadone UR: NOT DETECTED
POC Methamphetamine UR: NOT DETECTED
POC Morphine: POSITIVE — AB
POC Oxazepam (BZO): NOT DETECTED
POC Oxycodone UR: NOT DETECTED
POC Secobarbital (BAR): NOT DETECTED

## 2020-08-06 LAB — POC SARS CORONAVIRUS 2 AG: SARSCOV2ONAVIRUS 2 AG: NEGATIVE

## 2020-08-06 MED ORDER — ONDANSETRON 4 MG PO TBDP: 4.0000 mg | ORAL_TABLET | Freq: Four times a day (QID) | ORAL | Status: DC | PRN

## 2020-08-06 MED ORDER — IRBESARTAN 75 MG PO TABS
150.0000 mg | ORAL_TABLET | Freq: Every day | ORAL | Status: DC
  Administered 2020-08-06 – 2020-08-07 (×2): 150 mg via ORAL
  Filled 2020-08-06 (×2): qty 2

## 2020-08-06 MED ORDER — ADULT MULTIVITAMIN W/MINERALS CH
1.0000 | ORAL_TABLET | Freq: Every day | ORAL | Status: DC
  Administered 2020-08-06 – 2020-08-07 (×2): 1 via ORAL
  Filled 2020-08-06 (×2): qty 1

## 2020-08-06 MED ORDER — PANTOPRAZOLE SODIUM 40 MG PO TBEC
40.0000 mg | DELAYED_RELEASE_TABLET | Freq: Every day | ORAL | Status: DC
  Administered 2020-08-06 – 2020-08-07 (×2): 40 mg via ORAL
  Filled 2020-08-06 (×2): qty 1

## 2020-08-06 MED ORDER — MAGNESIUM HYDROXIDE 400 MG/5ML PO SUSP: 30.0000 mL | Freq: Every day | ORAL | Status: DC | PRN

## 2020-08-06 MED ORDER — THIAMINE HCL 100 MG/ML IJ SOLN: 100.0000 mg | Freq: Once | INTRAMUSCULAR | Status: DC

## 2020-08-06 MED ORDER — LORAZEPAM 1 MG PO TABS: 1.0000 mg | ORAL_TABLET | Freq: Four times a day (QID) | ORAL | Status: DC | PRN

## 2020-08-06 MED ORDER — ALUM & MAG HYDROXIDE-SIMETH 200-200-20 MG/5ML PO SUSP: 30.0000 mL | ORAL | Status: DC | PRN

## 2020-08-06 MED ORDER — AMLODIPINE BESYLATE 5 MG PO TABS
5.0000 mg | ORAL_TABLET | Freq: Once | ORAL | Status: AC
  Administered 2020-08-06: 5 mg via ORAL
  Filled 2020-08-06: qty 1

## 2020-08-06 MED ORDER — THIAMINE HCL 100 MG PO TABS
100.0000 mg | ORAL_TABLET | Freq: Every day | ORAL | Status: DC
  Administered 2020-08-07: 100 mg via ORAL
  Filled 2020-08-06: qty 1

## 2020-08-06 MED ORDER — LOPERAMIDE HCL 2 MG PO CAPS: 2.0000 mg | ORAL_CAPSULE | ORAL | Status: DC | PRN

## 2020-08-06 MED ORDER — ACETAMINOPHEN 325 MG PO TABS: 650.0000 mg | ORAL_TABLET | Freq: Four times a day (QID) | ORAL | Status: DC | PRN

## 2020-08-06 MED ORDER — RISPERIDONE 0.5 MG PO TABS
0.5000 mg | ORAL_TABLET | Freq: Every day | ORAL | Status: DC
  Administered 2020-08-06: 0.5 mg via ORAL
  Filled 2020-08-06: qty 1

## 2020-08-06 MED ORDER — TRAZODONE HCL 50 MG PO TABS: 50.0000 mg | ORAL_TABLET | Freq: Every evening | ORAL | Status: DC | PRN

## 2020-08-06 NOTE — ED Notes (Signed)
Pt states he has a "hiatal hernia" and usually takes a medication "like Nexium." Pt is asking if he can have the medication while at [facility] to prevent acid reflux. Leandro Reasoner, NP notified and will review pt's medications.

## 2020-08-06 NOTE — ED Provider Notes (Addendum)
Behavioral Health Admission H&P [facility] & OBS)  Date: 08/06/20 Patient Name: Ethan [name] MRN: [id number] Chief Complaint:  Chief Complaint  Patient presents with  . urgent emergent eval IVC  . Suicidal    Per IVC, Pt endorsed suicidal ideation; also endorsed to author use of alcohol and crack cocaine   Chief Complaint/Presenting Problem: Pt presented under IVC due to suicidal ideation, hallucination, threat of overdose, and threat to harm others  Diagnoses:  Final diagnoses:  Schizoaffective disorder, depressive type [facility])    HPI: Ethan [name] 55 year old African-American male presents under involuntary commitment.  Was reported to mobile crisis initiated IVC. "  [name] stated he has suicidal ideation within the past 24 hours and currently has no medications due to throwing it away to avoid harming himself by overdose,"  Per IVC " [name] " left the community he may harm himself or others in his life including his ex girlfriend and her son"    Ethan [name] reported diagnosis with schizoaffective disorder, major depressive disorder and substance abuse. patient reported using cocaine, marijuana and drinking alcohol today.  He reports a verbal altercation between him and his girlfriend.  States that his needed time " to cool off, we need time apart."  During this assessment he denied suicidal or homicidal ideations.  Denies auditory or visual hallucinations. However reported auditory halluciation in the past.   Reports he was followed by therapy and psychiatry in the past however has not followed up with medications or therapy.  Overnight observation,  reevaluation in the morning.  Support, encouragement and reassurance was provided.  PHQ 2-9:  [place] [facility] from 08/06/2020 in [facility] Office Visit from 03/07/2020 in [place] Office Visit from 02/14/2020 in [place]  Thoughts that you would be better off  dead, or of hurting yourself in some way Several days More than half the days Nearly every day  [pt states that he does not have a plan but he does think about hurting him self and he is also scared about hurt others]  PHQ-9 Total Score 11 20 12       Flowsheet Row ED from 08/06/2020 in [place] Error: Q7 should not be populated when Q6 is No       Total Time spent with patient: 15 minutes  Musculoskeletal  Strength & Muscle Tone: within normal limits Gait & Station: normal Patient leans: N/A  Psychiatric Specialty Exam  Presentation General Appearance: Appropriate for Environment  Eye Contact:Good  Speech:Clear and Coherent  Speech Volume:Normal  Handedness:Right   Mood and Affect  Mood:Depressed; Anxious  Affect:Congruent   Thought Process  Thought Processes:Coherent  Descriptions of Associations:Intact  Orientation:Full (Time, Place and Person)  Thought Content:Logical  Diagnosis of Schizophrenia or Schizoaffective disorder in past: Yes   Hallucinations:Hallucinations: None  Ideas of Reference:None  Suicidal Thoughts:Suicidal Thoughts: Yes, Passive SI Passive Intent and/or Plan: Without Intent; Without Plan  Homicidal Thoughts:Homicidal Thoughts: No   Sensorium  Memory:Immediate Fair; Recent Fair; Remote Fair  Judgment:Fair  Insight:Fair   Executive Functions  Concentration:Fair  Attention Span:Good  [place] of Knowledge:Good  Language:Good   Psychomotor Activity  Psychomotor Activity:Psychomotor Activity: Normal; Restlessness   Assets  Assets:Communication Skills; Social Support; Intimacy   Sleep  Sleep:Sleep: Fair   Nutritional Assessment (For OBS and [facility] admissions only) Has the patient had a weight loss or gain of 10 pounds or more in the last  3 months?: No Has the patient had a decrease in food intake/or appetite?: No Does the patient have dental problems?:  No Does the patient have eating habits or behaviors that may be indicators of an eating disorder including binging or inducing vomiting?: No Has the patient recently lost weight without trying?: No Has the patient been eating poorly because of a decreased appetite?: No Malnutrition Screening Tool Score: 0    Physical Exam ROS  Blood pressure 123/82, pulse (!) 102, resp. rate 18, SpO2 98 %. There is no height or weight on file to calculate BMI.  Past Psychiatric History:   Is the patient at risk to self? No  Has the patient been a risk to self in the past 6 months? No .    Has the patient been a risk to self within the distant past? No   Is the patient a risk to others? No   Has the patient been a risk to others in the past 6 months? No   Has the patient been a risk to others within the distant past? No   Past Medical History:  Past Medical History:  Diagnosis Date  . Alcoholism ([place])   . Anxiety   . Depression   . GERD (gastroesophageal reflux disease)   . Heart murmur   . Hepatitis C    Treated in prison for 18 months.  Sounds like Ribaviron and Interferon  2009-2011  . Hypertension   . Peptic ulcer   . Substance abuse ([place])   . Urge urinary incontinence    07/20/2015:  evaluated by Dr. Kathie Rhodes, Urodynamics planned    Past Surgical History:  Procedure Laterality Date  . APPENDECTOMY    . INGUINAL HERNIA REPAIR Bilateral     Family History:  Family History  Problem Relation Age of Onset  . Breast cancer Sister   . Breast cancer Maternal Grandmother   . Colon polyps Brother   . Diabetes Sister        x 2  . Diabetes Maternal Aunt   . Diabetes Paternal Aunt   . Diabetes Mother        DM complications cause of death  . Alcohol abuse Father   . COPD Neg Hx     Social History:  Social History   Socioeconomic History  . Marital status: Significant Other    Spouse name: Not on file  . Number of children: 3  . Years of education: GED  . Highest education  level: Not on file  Occupational History  . 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 [employer]  Tobacco Use  . Smoking status: Current Every Day Smoker    Packs/day: 0.50    Years: 12.00    Pack years: 6.00    Types: Cigarettes    Start date: 07/14/1976  . Smokeless tobacco: Never Used  . Tobacco comment: Stopped for 16 years.  Restarted with bout of depression/anxiety recently  Substance and Sexual Activity  . Alcohol use: Yes    Alcohol/week: 0.0 standard drinks    Comment: 3 beers nightly  . Drug use: Yes    Types: "Crack" cocaine    Comment: Occassionally MJ currently--in past crack cocaine--more than 16 years ago.  [name] Sexual activity: Yes    Partners: Female    Birth control/protection: None    Comment: WIFE USED MAJUANA SATURDAY  Other Topics Concern  . Not on file  Social History Narrative   Lost job and schooling  secondary to "past"   Apparently for what he was incarcerated for.   Social Determinants of Health   Financial Resource Strain: High Risk  . Difficulty of Paying Living Expenses: Hard  Food Insecurity: Food Insecurity Present  . Worried About Charity fundraiser in the Last Year: Sometimes true  . Ran Out of Food in the Last Year: Sometimes true  Transportation Needs: No Transportation Needs  . Lack of Transportation (Medical): No  . Lack of Transportation (Non-Medical): No  Physical Activity: Insufficiently Active  . Days of Exercise per Week: 1 day  . Minutes of Exercise per Session: 30 min  Stress: Not on file  Social Connections: Unknown  . Frequency of Communication with Friends and Family: More than three times a week  . Frequency of Social Gatherings with Friends and Family: More than three times a week  . Attends Religious Services: Not on file  . Active Member of Clubs or Organizations: Not on file  . Attends Archivist Meetings: Not on file  . Marital Status: Not on file  Intimate Partner Violence: Not on  file    SDOH:  SDOH Screenings   Alcohol Screen: Not on file  Depression (PHQ2-9): Medium Risk  . PHQ-2 Score: 11  Financial Resource Strain: High Risk  . Difficulty of Paying Living Expenses: Hard  Food Insecurity: Food Insecurity Present  . Worried About Charity fundraiser in the Last Year: Sometimes true  . Ran Out of Food in the Last Year: Sometimes true  Housing: High Risk  . Last Housing Risk Score: 2  Physical Activity: Insufficiently Active  . Days of Exercise per Week: 1 day  . Minutes of Exercise per Session: 30 min  Social Connections: Unknown  . Frequency of Communication with Friends and Family: More than three times a week  . Frequency of Social Gatherings with Friends and Family: More than three times a week  . Attends Religious Services: Not on file  . Active Member of Clubs or Organizations: Not on file  . Attends Archivist Meetings: Not on file  . Marital Status: Not on file  Stress: Not on file  Tobacco Use: High Risk  . Smoking Tobacco Use: Current Every Day Smoker  . Smokeless Tobacco Use: Never Used  Transportation Needs: No Transportation Needs  . Lack of Transportation (Medical): No  . Lack of Transportation (Non-Medical): No    Last Labs:  Admission on 08/06/2020  Component Date Value Ref Range Status  . POC Amphetamine UR 08/06/2020 None Detected  NONE DETECTED (Cut Off Level 1000 ng/mL) Final  . POC Secobarbital (BAR) 08/06/2020 None Detected  NONE DETECTED (Cut Off Level 300 ng/mL) Final  . POC Buprenorphine (BUP) 08/06/2020 None Detected  NONE DETECTED (Cut Off Level 10 ng/mL) Final  . POC Oxazepam (BZO) 08/06/2020 None Detected  NONE DETECTED (Cut Off Level 300 ng/mL) Final  . POC Cocaine UR 08/06/2020 Positive* NONE DETECTED (Cut Off Level 300 ng/mL) Final  . POC Methamphetamine UR 08/06/2020 None Detected  NONE DETECTED (Cut Off Level 1000 ng/mL) Final  . POC Morphine 08/06/2020 Positive* NONE DETECTED (Cut Off Level 300 ng/mL)  Final  . POC Oxycodone UR 08/06/2020 None Detected  NONE DETECTED (Cut Off Level 100 ng/mL) Final  . POC Methadone UR 08/06/2020 None Detected  NONE DETECTED (Cut Off Level 300 ng/mL) Final  . POC Marijuana UR 08/06/2020 Positive* NONE DETECTED (Cut Off Level 50 ng/mL) Final  . SARS Coronavirus 2 Ag 08/06/2020  Negative  Negative Final  . SARSCOV2ONAVIRUS 2 AG 08/06/2020 NEGATIVE  NEGATIVE Final   Comment: (NOTE) SARS-CoV-2 antigen NOT DETECTED.   Negative results are presumptive.  Negative results do not preclude SARS-CoV-2 infection and should not be used as the sole basis for treatment or other patient management decisions, including infection  control decisions, particularly in the presence of clinical signs and  symptoms consistent with COVID-19, or in those who have been in contact with the virus.  Negative results must be combined with clinical observations, patient history, and epidemiological information. The expected result is Negative.  Fact Sheet for Patients: HandmadeRecipes.com.cy  Fact Sheet for Healthcare Providers: FuneralLife.at  This test is not yet approved or cleared by the [place] FDA and  has been authorized for detection and/or diagnosis of SARS-CoV-2 by FDA under an Emergency Use Authorization (EUA).  This EUA will remain in effect (meaning this test can be used) for the duration of  the COV                          ID-19 declaration under Section 564(b)(1) of the Act, 21 U.S.C. section 360bbb-3(b)(1), unless the authorization is terminated or revoked sooner.    Office Visit on 03/07/2020  Component Date Value Ref Range Status  . Hemoglobin A1C 03/07/2020 5.4  4.0 - 5.6 % Final  . Glucose-Capillary 03/07/2020 108* 70 - 99 mg/dL Final   Glucose reference range applies only to samples taken after fasting for at least 8 hours.    Allergies: Patient has no known allergies.  PTA Medications: (Not in a  hospital admission)   Medical Decision Making  Involuntary commitment -Overnight observation    Recommendations  Based on my evaluation the patient does not appear to have an emergency medical condition.  Will restart medications where appropriate Ativan/ CIWA protocol    Derrill Center, NP 08/06/20  6:48 PM

## 2020-08-06 NOTE — ED Notes (Signed)
Pt asleep in bed. Respirations even and unlabored. Will continue to monitor for safety. ?

## 2020-08-06 NOTE — ED Notes (Signed)
Pt given meal

## 2020-08-06 NOTE — Discharge Instructions (Addendum)
Take all medications as prescribed. Keep all follow-up appointments as scheduled.  Do not consume alcohol or use illegal drugs while on prescription medications. Report any adverse effects from your medications to your primary care provider promptly.  In the event of recurrent symptoms or worsening symptoms, call 911, a crisis hotline, or go to the nearest emergency department for evaluation.   

## 2020-08-06 NOTE — Progress Notes (Signed)
Pt was admitted to OBS bed 2 due to SI with plan to overdose. Pt is alert and oriented. Pt is ambulatory and is oriented to staff and unit. Pt was cooperative with skin assessment. Nourishment given. Pt denies pain, SI, HI and AVH at this time. Staff will monitor for pt's safety.

## 2020-08-06 NOTE — ED Notes (Addendum)
Pt laying in bed eating a sandwich. A&O x4, calm and cooperative. Pt denies SI/HI/AVH and states he's "good as long as I'm away from my ex." No signs of acute distress noted. Will continue to monitor for safety.
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
## 2020-08-07 LAB — CBC WITH DIFFERENTIAL/PLATELET
Abs Immature Granulocytes: 0.04 10*3/uL (ref 0.00–0.07)
Basophils Absolute: 0 10*3/uL (ref 0.0–0.1)
Basophils Relative: 1 %
Eosinophils Absolute: 0.2 10*3/uL (ref 0.0–0.5)
Eosinophils Relative: 3 %
HCT: 39.8 % (ref 39.0–52.0)
Hemoglobin: 12.9 g/dL — ABNORMAL LOW (ref 13.0–17.0)
Immature Granulocytes: 1 %
Lymphocytes Relative: 38 %
Lymphs Abs: 2.5 10*3/uL (ref 0.7–4.0)
MCH: 28.7 pg (ref 26.0–34.0)
MCHC: 32.4 g/dL (ref 30.0–36.0)
MCV: 88.6 fL (ref 80.0–100.0)
Monocytes Absolute: 0.5 10*3/uL (ref 0.1–1.0)
Monocytes Relative: 8 %
Neutro Abs: 3.2 10*3/uL (ref 1.7–7.7)
Neutrophils Relative %: 49 %
Platelets: 224 10*3/uL (ref 150–400)
RBC: 4.49 MIL/uL (ref 4.22–5.81)
RDW: 13.2 % (ref 11.5–15.5)
WBC: 6.5 10*3/uL (ref 4.0–10.5)
nRBC: 0 % (ref 0.0–0.2)

## 2020-08-07 LAB — COMPREHENSIVE METABOLIC PANEL
ALT: 55 U/L — ABNORMAL HIGH (ref 0–44)
AST: 72 U/L — ABNORMAL HIGH (ref 15–41)
Albumin: 3.6 g/dL (ref 3.5–5.0)
Alkaline Phosphatase: 55 U/L (ref 38–126)
Anion gap: 10 (ref 5–15)
BUN: 14 mg/dL (ref 6–20)
CO2: 21 mmol/L — ABNORMAL LOW (ref 22–32)
Calcium: 9 mg/dL (ref 8.9–10.3)
Chloride: 108 mmol/L (ref 98–111)
Creatinine, Ser: 1.16 mg/dL (ref 0.61–1.24)
GFR, Estimated: >60 mL/min (ref >60)
Glucose, Bld: 72 mg/dL (ref 70–99)
Potassium: 4.6 mmol/L (ref 3.5–5.1)
Sodium: 139 mmol/L (ref 135–145)
Total Bilirubin: 0.8 mg/dL (ref 0.3–1.2)
Total Protein: 6.4 g/dL — ABNORMAL LOW (ref 6.5–8.1)

## 2020-08-07 LAB — RESP PANEL BY RT-PCR (FLU A&B, COVID) ARPGX2
Influenza A by PCR: NEGATIVE
Influenza B by PCR: NEGATIVE
SARS Coronavirus 2 by RT PCR: NEGATIVE

## 2020-08-07 LAB — ETHANOL: Alcohol, Ethyl (B): 27 mg/dL — ABNORMAL HIGH (ref <10)

## 2020-08-07 LAB — TSH: TSH: 0.509 u[IU]/mL (ref 0.350–4.500)

## 2020-08-07 NOTE — Discharge Summary (Signed)
Ethan [name] to be D/C'd home per NP order. Discussed with the patient and all questions fully answered. An After Visit Summary was printed and given to the patient. Patient escorted out, and D/C home via private auto.  [name]  08/07/2020 10:09 AM

## 2020-08-07 NOTE — ED Provider Notes (Addendum)
FBC/OBS ASAP Discharge Summary  Date and Time: 08/07/2020 9:44 AM  Name: Ethan [name]  MRN:  [id number]   Discharge Diagnoses:  Final diagnoses:  Schizoaffective disorder, depressive type [facility])   Per admission assessment note:  "Patient is a 55 year old male who reported to [name] under involuntary commitment on 08/06/2020.  Mobile crisis apparently initiated the involuntary commitment after voicing suicidal ideation.  Apparently the patient has not been compliant with medications and has been abusing substances as well.  He reported a recent verbal altercation between himself and his girlfriend.  He reports that he needed "time to cool off".  He did report auditory hallucinations in the past, but nothing current.  Agree with monitoring in the behavioral health urgent care center, collecting collateral data on information of medications he is reportedly to be taking, and to monitor for withdrawal symptoms."   On evaluation: Denying suicidal or homicidal ideations.  Denies auditory or visual hallucinations.  Denies taking medications as indicated.  Declined medication prescriptions at discharge.  Patient provided verbal authorization to follow-up with his girlfriend Ethan [name] who denied any safety concerns with patient returning home.  No guns or weapons in the home was reported.  Patient to consider follow-up with [name] urgent care facility for outpatient therapy and medication management.  Involuntary commitment rescinded.   Total Time spent with patient: 15 minutes  Past Psychiatric History:  Past Medical History:  Past Medical History:  Diagnosis Date  . Alcoholism ([place])   . Anxiety   . Depression   . GERD (gastroesophageal reflux disease)   . Heart murmur   . Hepatitis C    Treated in prison for 18 months.  Sounds like Ribaviron and Interferon  2009-2011  . Hypertension   . Peptic ulcer   . Substance abuse ([place])   . Urge  urinary incontinence    07/20/2015:  evaluated by Dr. Kathie Rhodes, Urodynamics planned    Past Surgical History:  Procedure Laterality Date  . APPENDECTOMY    . INGUINAL HERNIA REPAIR Bilateral    Family History:  Family History  Problem Relation Age of Onset  . Breast cancer Sister   . Breast cancer Maternal Grandmother   . Colon polyps Brother   . Diabetes Sister        x 2  . Diabetes Maternal Aunt   . Diabetes Paternal Aunt   . Diabetes Mother        DM complications cause of death  . Alcohol abuse Father   . COPD Neg Hx    Family Psychiatric History:  Social History:  Social History   Substance and Sexual Activity  Alcohol Use Yes  . Alcohol/week: 0.0 standard drinks   Comment: 3 beers nightly     Social History   Substance and Sexual Activity  Drug Use Yes  . Types: "Crack" cocaine   Comment: Occassionally MJ currently--in past crack cocaine--more than 16 years ago.    Social History   Socioeconomic History  . Marital status: Single    Spouse name: Not on file  . Number of children: 3  . Years of education: GED  . Highest education level: Not on file  Occupational History  . 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 [employer]  Tobacco Use  . Smoking status: Current Every Day Smoker    Packs/day: 0.50    Years: 12.00    Pack years: 6.00    Types:  Cigarettes    Start date: 07/14/1976  . Smokeless tobacco: Never Used  . Tobacco comment: Stopped for 16 years.  Restarted with bout of depression/anxiety recently  Substance and Sexual Activity  . Alcohol use: Yes    Alcohol/week: 0.0 standard drinks    Comment: 3 beers nightly  . Drug use: Yes    Types: "Crack" cocaine    Comment: Occassionally MJ currently--in past crack cocaine--more than 16 years ago.  [name] Sexual activity: Yes    Partners: Female    Birth control/protection: None    Comment: WIFE USED MAJUANA SATURDAY  Other Topics Concern  . Not on file  Social History  Narrative   Lost job and schooling secondary to "past"   Apparently for what he was incarcerated for.   Social Determinants of Health   Financial Resource Strain: High Risk  . Difficulty of Paying Living Expenses: Hard  Food Insecurity: Food Insecurity Present  . Worried About Charity fundraiser in the Last Year: Sometimes true  . Ran Out of Food in the Last Year: Sometimes true  Transportation Needs: No Transportation Needs  . Lack of Transportation (Medical): No  . Lack of Transportation (Non-Medical): No  Physical Activity: Insufficiently Active  . Days of Exercise per Week: 1 day  . Minutes of Exercise per Session: 30 min  Stress: Not on file  Social Connections: Unknown  . Frequency of Communication with Friends and Family: More than three times a week  . Frequency of Social Gatherings with Friends and Family: More than three times a week  . Attends Religious Services: Not on file  . Active Member of Clubs or Organizations: Not on file  . Attends Archivist Meetings: Not on file  . Marital Status: Not on file   SDOH:  SDOH Screenings   Alcohol Screen: Not on file  Depression (PHQ2-9): Medium Risk  . PHQ-2 Score: 11  Financial Resource Strain: High Risk  . Difficulty of Paying Living Expenses: Hard  Food Insecurity: Food Insecurity Present  . Worried About Charity fundraiser in the Last Year: Sometimes true  . Ran Out of Food in the Last Year: Sometimes true  Housing: High Risk  . Last Housing Risk Score: 2  Physical Activity: Insufficiently Active  . Days of Exercise per Week: 1 day  . Minutes of Exercise per Session: 30 min  Social Connections: Unknown  . Frequency of Communication with Friends and Family: More than three times a week  . Frequency of Social Gatherings with Friends and Family: More than three times a week  . Attends Religious Services: Not on file  . Active Member of Clubs or Organizations: Not on file  . Attends Archivist  Meetings: Not on file  . Marital Status: Not on file  Stress: Not on file  Tobacco Use: High Risk  . Smoking Tobacco Use: Current Every Day Smoker  . Smokeless Tobacco Use: Never Used  Transportation Needs: No Transportation Needs  . Lack of Transportation (Medical): No  . Lack of Transportation (Non-Medical): No    Has this patient used any form of tobacco in the last 30 days? (Cigarettes, Smokeless Tobacco, Cigars, and/or Pipes) A prescription for an FDA-approved tobacco cessation medication was offered at discharge and the patient refused  Current Medications:  Current Facility-Administered Medications  Medication Dose Route Frequency Provider Last Rate Last Admin  . acetaminophen (TYLENOL) tablet 650 mg  650 mg Oral Q6H PRN Derrill Center, NP      .  alum & mag hydroxide-simeth (MAALOX/MYLANTA) 200-200-20 MG/5ML suspension 30 mL  30 mL Oral Q4H PRN Derrill Center, NP      . irbesartan (AVAPRO) tablet 150 mg  150 mg Oral Daily Derrill Center, NP   150 mg at 08/07/20 0917  . loperamide (IMODIUM) capsule 2-4 mg  2-4 mg Oral PRN Derrill Center, NP      . LORazepam (ATIVAN) tablet 1 mg  1 mg Oral Q6H PRN Derrill Center, NP      . magnesium hydroxide (MILK OF MAGNESIA) suspension 30 mL  30 mL Oral Daily PRN Derrill Center, NP      . multivitamin with minerals tablet 1 tablet  1 tablet Oral Daily Derrill Center, NP   1 tablet at 08/07/20 [phone]  . ondansetron (ZOFRAN-ODT) disintegrating tablet 4 mg  4 mg Oral Q6H PRN Derrill Center, NP      . pantoprazole (PROTONIX) EC tablet 40 mg  40 mg Oral Daily Ajibola, Ene A, NP   40 mg at 08/07/20 0918  . risperiDONE (RISPERDAL) tablet 0.5 mg  0.5 mg Oral QHS Derrill Center, NP   0.5 mg at 08/06/20 2114  . thiamine (B-1) injection 100 mg  100 mg Intramuscular Once Derrill Center, NP      . thiamine tablet 100 mg  100 mg Oral Daily Derrill Center, NP   100 mg at 08/07/20 0917  . traZODone (DESYREL) tablet 50 mg  50 mg Oral QHS PRN Derrill Center, NP       Current Outpatient Medications  Medication Sig Dispense Refill  . amLODipine (NORVASC) 5 MG tablet Take 1 tablet (5 mg total) by mouth daily. 90 tablet 1  . Blood Pressure Monitoring (BLOOD PRESSURE KIT) DEVI Measure blood pressure twice a day 1 each 0  . hydrOXYzine (ATARAX/VISTARIL) 25 MG tablet Take 1 tablet (25 mg total) by mouth every 6 (six) hours as needed for anxiety. 30 tablet 0  . nicotine (NICODERM CQ - DOSED IN MG/24 HOURS) 21 mg/24hr patch Place 1 patch (21 mg total) onto the skin daily. (Patient not taking: Reported on 11/15/2019) 28 patch 0  . pantoprazole (PROTONIX) 40 MG tablet Take 1 tablet (40 mg total) by mouth daily. 90 tablet 1  . risperiDONE (RISPERDAL) 0.5 MG tablet Take 1 tablet (0.5 mg total) by mouth 2 (two) times daily. 60 tablet 0  . traZODone (DESYREL) 50 MG tablet Take 1 tablet (50 mg total) by mouth at bedtime and may repeat dose one time if needed. 30 tablet 0  . valsartan-hydrochlorothiazide (DIOVAN-HCT) 160-12.5 MG tablet Take 1 tablet by mouth daily. 90 tablet 1    PTA Medications: (Not in a hospital admission)   Musculoskeletal  Strength & Muscle Tone: within normal limits Gait & Station: normal Patient leans: N/A  Psychiatric Specialty Exam  Presentation  General Appearance: Appropriate for Environment  Eye Contact:Good  Speech:Clear and Coherent  Speech Volume:Normal  Handedness:Right   Mood and Affect  Mood:Depressed; Anxious  Affect:Congruent   Thought Process  Thought Processes:Coherent  Descriptions of Associations:Intact  Orientation:Full (Time, Place and Person)  Thought Content:Logical  Diagnosis of Schizophrenia or Schizoaffective disorder in past: Yes  Duration of Psychotic Symptoms: Greater than six months   Hallucinations:Hallucinations: None  Ideas of Reference:None  Suicidal Thoughts:Suicidal Thoughts: Yes, Passive SI Passive Intent and/or Plan: Without Intent; Without Plan  Homicidal  Thoughts:Homicidal Thoughts: No   Sensorium  Memory:Immediate Fair; Recent Fair; Remote Fair  Judgment:Fair  Insight:Fair   Executive Functions  Concentration:Fair  Attention Span:Good  Simpson of Knowledge:Good  Language:Good   Psychomotor Activity  Psychomotor Activity:Psychomotor Activity: Normal; Restlessness   Assets  Assets:Communication Skills; Social Support; Intimacy   Sleep  Sleep:Sleep: Fair   Nutritional Assessment (For OBS and FBC admissions only) Has the patient had a weight loss or gain of 10 pounds or more in the last 3 months?: No Has the patient had a decrease in food intake/or appetite?: No Does the patient have dental problems?: No Does the patient have eating habits or behaviors that may be indicators of an eating disorder including binging or inducing vomiting?: No Has the patient recently lost weight without trying?: No Has the patient been eating poorly because of a decreased appetite?: No Malnutrition Screening Tool Score: 0    Physical Exam  Physical Exam Vitals reviewed.  Cardiovascular:     Rate and Rhythm: Normal rate and regular rhythm.     Pulses: Normal pulses.  Neurological:     Mental Status: He is alert.  Psychiatric:        Attention and Perception: Attention and perception normal.        Mood and Affect: Mood normal.        Behavior: Behavior normal.        Thought Content: Thought content normal. Thought content does not include suicidal ideation.        Judgment: Judgment normal.    Review of Systems  Psychiatric/Behavioral: Positive for depression and substance abuse. Negative for suicidal ideas. The patient is nervous/anxious.   All other systems reviewed and are negative.  Blood pressure (!) 140/96, pulse 72, temperature 97.9 F (36.6 C), temperature source Oral, resp. rate 18, SpO2 100 %. There is no height or weight on file to calculate BMI.  Demographic Factors:  Male  Loss  Factors: Decrease in vocational status, Financial problems/change in socioeconomic status and NA  Historical Factors: Prior suicide attempts and Family history of mental illness or substance abuse  Risk Reduction Factors:   Living with another person, especially a relative, Positive social support and Positive therapeutic relationship  Continued Clinical Symptoms:  Depression:   Comorbid alcohol abuse/dependence Alcohol/Substance Abuse/Dependencies  Cognitive Features That Contribute To Risk:  Closed-mindedness    Suicide Risk:  Minimal: No identifiable suicidal ideation.  Patients presenting with no risk factors but with morbid ruminations; may be classified as minimal risk based on the severity of the depressive symptoms  Plan Of Care/Follow-up recommendations:  Activity:  as tolerated Diet:  heart healthy  Disposition: Take all medications as prescribed. Keep all follow-up appointments as scheduled.  Do not consume alcohol or use illegal drugs while on prescription medications. Report any adverse effects from your medications to your primary care provider promptly.  In the event of recurrent symptoms or worsening symptoms, call 911, a crisis hotline, or go to the nearest emergency department for evaluation.   -Consider follow-up with [facility] urgent care for medication management and therapy services, patient declined at discharge will become available in AVS  Derrill Center, NP 08/07/2020, 9:44 AM

## 2020-08-07 NOTE — ED Notes (Signed)
Pt asleep in bed. Respirations even and unlabored. Will continue to monitor for safety. ?

## 2020-08-07 NOTE — Progress Notes (Signed)
Pt is alert and oriented. Pt did not voice any complaints of pain or discomfort. Administered meds with no incident. Pt denies SI, HI and AVH at this time. Staff will monitor for pt's safety. 

## 2020-08-11 ENCOUNTER — Telehealth: Payer: Medicaid Other

## 2020-08-21 ENCOUNTER — Telehealth: Payer: Medicaid Other

## 2020-09-04 ENCOUNTER — Ambulatory Visit: Payer: Medicaid Other | Admitting: *Deleted

## 2020-09-04 ENCOUNTER — Other Ambulatory Visit: Payer: Self-pay | Admitting: Student

## 2020-09-04 DIAGNOSIS — F259 Schizoaffective disorder, unspecified: Secondary | ICD-10-CM

## 2020-09-04 DIAGNOSIS — F17200 Nicotine dependence, unspecified, uncomplicated: Secondary | ICD-10-CM

## 2020-09-04 DIAGNOSIS — R634 Abnormal weight loss: Secondary | ICD-10-CM

## 2020-09-04 DIAGNOSIS — I10 Essential (primary) hypertension: Secondary | ICD-10-CM

## 2020-09-04 DIAGNOSIS — Z87898 Personal history of other specified conditions: Secondary | ICD-10-CM

## 2020-09-04 MED ORDER — PANTOPRAZOLE SODIUM 40 MG PO TBEC: 40.0000 mg | DELAYED_RELEASE_TABLET | Freq: Every day | ORAL | 1 refills | Status: DC

## 2020-09-04 NOTE — Patient Instructions (Signed)
Visit Information It was nice speaking with you today. Goals Addressed            This Visit's Progress   . Make and Keep All Appointments       Timeframe:  Long-Range Goal Priority:  High Start Date:    04/27/20                         Expected End Date:  05/14/21                     Follow Up Date 10/12/20   - arrange a ride through an agency 1 week before appointment - ask family or friend for a ride - call to cancel if needed - keep a calendar with appointment dates - use [place] [facility] for transportation    Why is this important?    Part of staying healthy is seeing the doctor for follow-up care.   If you forget your appointments, there are some things you can do to stay on track.    Notes: not meeting goal, still has not had CT of chest and no follow up with clinic    . Track and Manage My Blood Pressure-Hypertension       Timeframe:  Long-Range Goal Priority:  High Start Date:       11/15/19                      Expected End Date:    ongoing                 Follow Up Date 10/12/20   - check blood pressure weekly - write blood pressure results in a log or diary    Why is this important?    You won't feel high blood pressure, but it can still hurt your blood vessels.   High blood pressure can cause heart or kidney problems. It can also cause a stroke.   Making lifestyle changes like losing a little weight or eating less salt will help.   Checking your blood pressure at home and at different times of the day can help to control blood pressure.   If the doctor prescribes medicine remember to take it the way the doctor ordered.   Call the office if you cannot afford the medicine or if there are questions about it.     Notes: home BP readings meeting goal per patient       The patient verbalized understanding of instructions, educational materials, and care plan provided today and declined offer to receive copy of patient instructions, educational materials,  and care plan.   The care management team will reach out to the patient again over the next 30 days.   Kelli Churn RN, CCM, [facility] RN Care Manager [phone]

## 2020-09-04 NOTE — Chronic Care Management (AMB) (Signed)
Care Management    RN Visit Note  09/04/2020 Name: Ethan [name] MRN: [id number] DOB: [birth year]  Subjective: Ethan [name] is a 55 y.o. year old male who is a primary care patient of Katsadouros, Candace Gallus, MD. The care management team was consulted for assistance with disease management and care coordination needs.    Engaged with patient by telephone for follow up visit in response to provider referral for case management and/or care coordination services.   Consent to Services:   [name] was given information about Care Management services today including:  1. Care Management services includes personalized support from designated clinical staff supervised by his physician, including individualized plan of care and coordination with other care providers 2. 24/7 contact phone numbers for assistance for urgent and routine care needs. 3. The patient may stop case management services at any time by phone call to the office staff.  Patient agreed to services and consent obtained.   Assessment: Review of patient past medical history, allergies, medications, health status, including review of consultants reports, laboratory and other test data, was performed as part of comprehensive evaluation and provision of chronic care management services.   SDOH (Social Determinants of Health) assessments and interventions performed:    Care Plan  No Known Allergies  Outpatient Encounter Medications as of 09/04/2020  Medication Sig  . amLODipine (NORVASC) 5 MG tablet Take 1 tablet (5 mg total) by mouth daily.  . Blood Pressure Monitoring (BLOOD PRESSURE KIT) DEVI Measure blood pressure twice a day  . hydrOXYzine (ATARAX/VISTARIL) 25 MG tablet Take 1 tablet (25 mg total) by mouth every 6 (six) hours as needed for anxiety.  . nicotine (NICODERM CQ - DOSED IN MG/24 HOURS) 21 mg/24hr patch Place 1 patch (21 mg total) onto the skin daily. (Patient not taking: Reported on 11/15/2019)  . pantoprazole  (PROTONIX) 40 MG tablet Take 1 tablet (40 mg total) by mouth daily.  . risperiDONE (RISPERDAL) 0.5 MG tablet Take 1 tablet (0.5 mg total) by mouth 2 (two) times daily.  . traZODone (DESYREL) 50 MG tablet Take 1 tablet (50 mg total) by mouth at bedtime and may repeat dose one time if needed.  . valsartan-hydrochlorothiazide (DIOVAN-HCT) 160-12.5 MG tablet Take 1 tablet by mouth daily.   No facility-administered encounter medications on file as of 09/04/2020.    Patient Active Problem List   Diagnosis Date Noted  . Healthcare maintenance 02/14/2020  . Weight loss, unintentional 11/10/2019  . History of prediabetes 11/10/2019  . GERD (gastroesophageal reflux disease) 12/22/2014  . Tobacco use disorder 12/22/2014  . Cannabis use disorder, severe, dependence ([place]) 12/22/2014  . Alcohol use disorder, mild, abuse 12/22/2014  . Schizoaffective disorder ([place]) 12/22/2014  . History of hepatitis C virus infection 09/01/2013    Conditions to be addressed/monitored:  HTN, Prediabetes, GERD, Schizoaffective Disorder, smokes, legally blind   Care Plan : CCM RN- HTN  Updates made by Barrington Ellison, RN since 09/04/2020 12:00 AM    Problem: Hypertension (Hypertension)   Priority: High    Goal: Hypertension Monitored   Start Date: 11/15/2019  Expected End Date: 12/12/2020  Recent Progress: On track  Priority: High  Note:   CARE PLAN ENTRY (see longitudinal plan of care for additional care plan information)  Objective:  [name] BP Readings from Last 3 Encounters: .  03/07/20 . (!) 143/92 .  02/14/20 . (!) 167/103 .  11/09/19 . (!) 123/96   Lab Results  Component Value Date   CREATININE 1.06  11/09/2019   BUN 11 11/09/2019   NA 138 11/09/2019   K 4.1 11/09/2019   CL 103 11/09/2019   CO2 21 11/09/2019     Current Barriers:  [name] Knowledge Deficits related to basic understanding of hypertension pathophysiology and self care management-  spoke with patient to complete follow up assessment, says his  self monitored blood pressures are "good", says he is feeling good mentally and physically, reports he needs refill on his Protonix, has all prescribed medicines and is taking his medications as prescribed  . -Case Manager Clinical Goal(s):  [name] Over the next 30-60 days, patient will demonstrate improved health management independence as evidenced by checking blood pressure as directed and notifying PCP if SBP>180 or DBP > 100 taking all medications as prescribe, and adhering to a low sodium diet as discussed.  Interventions:  . Evaluation of current treatment plan related to HTN and patient's adherence to plan as established by provider. . Reviewed medications with patient and discussed medication taking behavior . Reviewed home blood pressure readings and reviewed blood pressure targets . Positive reinforcement given to patient for home BP self monitoring . Encouraged patient to contact this CCM RN for questions or  concerns about BP self monitoring. [name] Messaged provider re: request for Protonix refill . Encouraged him to make follow up clinic appointment for BP reassessment  . Discussed plans with patient for ongoing care management follow up and ensured he has the contact number for the CCM team and clinic  Patient Self Care Activities:  . Self administers medications as prescribed . Attends all scheduled provider appointments . Calls provider office for new concerns, questions, or BP outside discussed parameters . Checks BP at least weekly and records as discussed . Follows a low sodium diet/DASH diet . follow-up on any referrals for help I am given . Reschedules CT of lung appointment . arrange a ride through an agency 1 week before appointment . - ask family or friend for a ride . - call to cancel if needed . - keep a calendar with appointment dates . - use [place] [facility] for transportation    Care Plan : CCM RN- Anxiety (Adult)  Updates made by Barrington Ellison, RN since  09/04/2020 12:00 AM    Problem: Symptoms (Anxiety)     Goal: Anxiety Symptoms Monitored and Managed   Start Date: 05/30/2020  Recent Progress: On track  Priority: High  Note:   Current Barriers:  . Care Coordination needs related to counseling services in a patient with HTN, Prediabetes, GERD, Schizoaffective Disorder, smokes, legally blind - patient states he is doing well from a mental health perspective, says he does not feel he would benefit form further sessions with the counselor at this time, says he has "given up" on finding another place to live, says he called the [facility] recently and was told he was on the waiting list, asked about follow up with clinic BSW . Unable to independently manage behavioral health issues  Nurse Case Manager Clinical Goal(s):  [name] Over the next 30 days, patient will work with clinic behavioral health counselor  to address needs related to behavioral health issues  Interventions:  . 1:1 collaboration with Riesa Pope, MD regarding development and update of comprehensive plan of care as evidenced by provider attestation and co-signature . Inter-disciplinary care team collaboration (see longitudinal plan of care) . Assessed reason for no follow up with clinic counselor . Will refer patient to clinic BSW to follow for  ongoing housing insecurity  Patient Goals/Self-Care Activities Over the next 30 days, patient will: keep initial appointment with counselor and at least 90% of all follow up counseling appointments     Follow Up Plan: The care management team will reach out to the patient again over the next 30-60 days.          Kelli Churn RN, CCM, [facility] RN Care Manager [phone]

## 2020-09-18 ENCOUNTER — Telehealth: Payer: Self-pay | Admitting: Licensed Clinical Social Worker

## 2020-09-18 ENCOUNTER — Telehealth: Payer: Medicaid Other | Admitting: Licensed Clinical Social Worker

## 2020-09-18 NOTE — Telephone Encounter (Signed)
  Care Management   Follow Up Note   09/18/2020 Name: Ethan [name] MRN: [id number] DOB: 02-Dec-1965   Referred by: Riesa Pope, MD Reason for referral : No chief complaint on file.   An unsuccessful telephone outreach was attempted today. The patient was referred to the case management team for assistance with care management and care coordination.   Follow Up Plan: Telephone follow up appointment with care management team member scheduled for: Next 30 days. SW left a VM with contact information.   Milus Height, [place]  Social Worker [facility] Care Management  [phone]

## 2020-09-22 ENCOUNTER — Telehealth: Payer: Self-pay

## 2020-09-22 ENCOUNTER — Telehealth: Payer: Medicaid Other

## 2020-09-22 NOTE — Telephone Encounter (Signed)
  Care Management   Outreach Note  09/22/2020 Name: Ethan [name] MRN: [id number] DOB: 05-01-1965  Referred by: Riesa Pope, MD Reason for referral : Chronic Care Management (Unsuccessful telephone outreach)   An unsuccessful telephone outreach was attempted today. The patient was referred to the case management team for assistance with care management and care coordination.   Follow Up Plan: Telephone follow up appointment with care management team member scheduled for: 1-2 weeks  Johnney Killian, RN, BSN, CCM Care Management Coordinator [facility] Phone: [phone] / Fax: [phone]

## 2020-10-09 ENCOUNTER — Telehealth: Payer: Medicaid Other

## 2020-10-17 ENCOUNTER — Telehealth: Payer: Medicaid Other | Admitting: Licensed Clinical Social Worker

## 2020-10-18 ENCOUNTER — Other Ambulatory Visit: Payer: Self-pay | Admitting: Licensed Clinical Social Worker

## 2020-10-18 ENCOUNTER — Telehealth: Payer: Self-pay | Admitting: Licensed Clinical Social Worker

## 2020-10-18 ENCOUNTER — Telehealth: Payer: Medicaid Other | Admitting: Licensed Clinical Social Worker

## 2020-10-18 ENCOUNTER — Ambulatory Visit: Payer: Self-pay | Admitting: Licensed Clinical Social Worker

## 2020-10-18 NOTE — Telephone Encounter (Signed)
  Care Management   Follow Up Note   10/18/2020 Name: Ethan [name] MRN: [id number] DOB: May 17, 1965   Referred by: Riesa Pope, MD Reason for referral : No chief complaint on file.   A second unsuccessful telephone outreach was attempted today. The patient was referred to the case management team for assistance with care management and care coordination.   Follow Up Plan: If patient returns call to provider office, please advise to call [place] * at [phone]*  Milus Height, [place] Worker [facility] Care Management  [phone]

## 2020-10-18 NOTE — Chronic Care Management (AMB) (Signed)
  Care Management   Follow Up Note   10/18/2020 Name: Ethan [name] MRN: [id number] DOB: 02-10-1966   Referred by: Riesa Pope, MD Reason for referral : No chief complaint on file.   A second unsuccessful telephone outreach was attempted today. The patient was referred to the case management team for assistance with care management and care coordination.   SW reviewed chart to determine needs and any additional point of contacts. SW attempted patients brother- Ethan [name] and was unable to leave a VM.  Follow Up Plan: If patient returns call to provider office, please advise to call [place]* at [phone]*.  Milus Height, [place]  Social Worker [facility] Care Management  [phone]

## 2020-10-20 ENCOUNTER — Telehealth: Payer: Self-pay

## 2020-10-20 ENCOUNTER — Telehealth: Payer: Medicaid Other

## 2020-10-20 ENCOUNTER — Telehealth: Payer: Self-pay | Admitting: *Deleted

## 2020-10-20 NOTE — Telephone Encounter (Signed)
  Care Management   Outreach Note  10/20/2020 Name: Ethan [name] MRN: [id number] DOB: 03-29-1966  Referred by: Riesa Pope, MD Reason for referral : No chief complaint on file.   A second unsuccessful telephone outreach was attempted today. The patient was referred to the case management team for assistance with care management and care coordination.   If patient calls clinic, please have him call  [place] * at [phone] to reschedule appointment.  Follow Up Plan: The patient has been provided with contact information for the care management team and has been advised to call with any health related questions or concerns.   Johnney Killian, RN, BSN, CCM Care Management Coordinator [facility] Phone: [phone] / Fax: [phone]

## 2020-10-20 NOTE — Telephone Encounter (Signed)
Patient called in stating, "I have a drug and alcohol problem." Offered appt to discuss but patient is wanting an inpatient treatment center. Gave him the following resources. He was very [employer] attorney.  [facility] [place], [place] [address] Office: [phone]  Fax: [phone] Accept walk-ins  [place]  [place], [place]

## 2020-10-20 NOTE — Telephone Encounter (Signed)
I agree with assessment and plan. If he does stop abruptly, he may need to go to the ED for admission to prevent withdrawals.

## 2020-10-22 ENCOUNTER — Emergency Department (HOSPITAL_COMMUNITY)
Admission: EM | Admit: 2020-10-22 | Discharge: 2020-10-25 | Disposition: A | Discharge status: Home / Self Care | Payer: Medicaid Other | Attending: Emergency Medicine | Admitting: Emergency Medicine

## 2020-10-22 ENCOUNTER — Other Ambulatory Visit: Payer: Self-pay

## 2020-10-22 ENCOUNTER — Encounter (HOSPITAL_COMMUNITY): Payer: Self-pay | Admitting: Emergency Medicine

## 2020-10-22 DIAGNOSIS — Z20822 Contact with and (suspected) exposure to covid-19: Secondary | ICD-10-CM | POA: Insufficient documentation

## 2020-10-22 DIAGNOSIS — F17210 Nicotine dependence, cigarettes, uncomplicated: Secondary | ICD-10-CM | POA: Diagnosis not present

## 2020-10-22 DIAGNOSIS — F1420 Cocaine dependence, uncomplicated: Secondary | ICD-10-CM

## 2020-10-22 DIAGNOSIS — F332 Major depressive disorder, recurrent severe without psychotic features: Secondary | ICD-10-CM | POA: Diagnosis not present

## 2020-10-22 DIAGNOSIS — F1910 Other psychoactive substance abuse, uncomplicated: Secondary | ICD-10-CM | POA: Insufficient documentation

## 2020-10-22 DIAGNOSIS — F10229 Alcohol dependence with intoxication, unspecified: Secondary | ICD-10-CM | POA: Insufficient documentation

## 2020-10-22 DIAGNOSIS — R440 Auditory hallucinations: Secondary | ICD-10-CM | POA: Diagnosis not present

## 2020-10-22 DIAGNOSIS — F333 Major depressive disorder, recurrent, severe with psychotic symptoms: Secondary | ICD-10-CM | POA: Insufficient documentation

## 2020-10-22 DIAGNOSIS — R45851 Suicidal ideations: Secondary | ICD-10-CM | POA: Diagnosis not present

## 2020-10-22 DIAGNOSIS — F1020 Alcohol dependence, uncomplicated: Secondary | ICD-10-CM

## 2020-10-22 DIAGNOSIS — Z79899 Other long term (current) drug therapy: Secondary | ICD-10-CM | POA: Insufficient documentation

## 2020-10-22 DIAGNOSIS — F1010 Alcohol abuse, uncomplicated: Secondary | ICD-10-CM

## 2020-10-22 LAB — CBC WITH DIFFERENTIAL/PLATELET
Abs Immature Granulocytes: 0.03 10*3/uL (ref 0.00–0.07)
Basophils Absolute: 0 10*3/uL (ref 0.0–0.1)
Basophils Relative: 0 %
Eosinophils Absolute: 0.3 10*3/uL (ref 0.0–0.5)
Eosinophils Relative: 4 %
HCT: 44.5 % (ref 39.0–52.0)
Hemoglobin: 14.2 g/dL (ref 13.0–17.0)
Immature Granulocytes: 1 %
Lymphocytes Relative: 28 %
Lymphs Abs: 1.8 10*3/uL (ref 0.7–4.0)
MCH: 28.1 pg (ref 26.0–34.0)
MCHC: 31.9 g/dL (ref 30.0–36.0)
MCV: 88.1 fL (ref 80.0–100.0)
Monocytes Absolute: 0.5 10*3/uL (ref 0.1–1.0)
Monocytes Relative: 7 %
Neutro Abs: 4 10*3/uL (ref 1.7–7.7)
Neutrophils Relative %: 60 %
Platelets: 236 10*3/uL (ref 150–400)
RBC: 5.05 MIL/uL (ref 4.22–5.81)
RDW: 13.3 % (ref 11.5–15.5)
WBC: 6.6 10*3/uL (ref 4.0–10.5)
nRBC: 0 % (ref 0.0–0.2)

## 2020-10-22 LAB — COMPREHENSIVE METABOLIC PANEL
ALT: 28 U/L (ref 0–44)
AST: 44 U/L — ABNORMAL HIGH (ref 15–41)
Albumin: 4.3 g/dL (ref 3.5–5.0)
Alkaline Phosphatase: 90 U/L (ref 38–126)
Anion gap: 9 (ref 5–15)
BUN: 14 mg/dL (ref 6–20)
CO2: 22 mmol/L (ref 22–32)
Calcium: 9.1 mg/dL (ref 8.9–10.3)
Chloride: 107 mmol/L (ref 98–111)
Creatinine, Ser: 1.07 mg/dL (ref 0.61–1.24)
GFR, Estimated: >60 mL/min (ref >60)
Glucose, Bld: 84 mg/dL (ref 70–99)
Potassium: 3.7 mmol/L (ref 3.5–5.1)
Sodium: 138 mmol/L (ref 135–145)
Total Bilirubin: 0.6 mg/dL (ref 0.3–1.2)
Total Protein: 7.7 g/dL (ref 6.5–8.1)

## 2020-10-22 LAB — RAPID URINE DRUG SCREEN, HOSP PERFORMED
Amphetamines: NOT DETECTED
Barbiturates: NOT DETECTED
Benzodiazepines: NOT DETECTED
Cocaine: POSITIVE — AB
Opiates: NOT DETECTED
Tetrahydrocannabinol: POSITIVE — AB

## 2020-10-22 LAB — URINALYSIS, ROUTINE W REFLEX MICROSCOPIC
Bilirubin Urine: NEGATIVE
Glucose, UA: NEGATIVE mg/dL
Hgb urine dipstick: NEGATIVE
Ketones, ur: NEGATIVE mg/dL
Leukocytes,Ua: NEGATIVE
Nitrite: NEGATIVE
Protein, ur: NEGATIVE mg/dL
Specific Gravity, Urine: 1.002 — ABNORMAL LOW (ref 1.005–1.030)
pH: 6 (ref 5.0–8.0)

## 2020-10-22 LAB — RESP PANEL BY RT-PCR (FLU A&B, COVID) ARPGX2
Influenza A by PCR: NEGATIVE
Influenza B by PCR: NEGATIVE
SARS Coronavirus 2 by RT PCR: NEGATIVE

## 2020-10-22 LAB — ETHANOL: Alcohol, Ethyl (B): 77 mg/dL — ABNORMAL HIGH (ref <10)

## 2020-10-22 LAB — ACETAMINOPHEN LEVEL: Acetaminophen (Tylenol), Serum: <10 ug/mL — ABNORMAL LOW (ref 10–30)

## 2020-10-22 LAB — SALICYLATE LEVEL: Salicylate Lvl: <7 mg/dL — ABNORMAL LOW (ref 7.0–30.0)

## 2020-10-22 MED ORDER — LORAZEPAM 1 MG PO TABS: 0.0000 mg | ORAL_TABLET | Freq: Two times a day (BID) | ORAL | Status: DC

## 2020-10-22 MED ORDER — LORAZEPAM 1 MG PO TABS
0.0000 mg | ORAL_TABLET | Freq: Four times a day (QID) | ORAL | Status: AC
Start: 2020-10-22 — End: 2020-10-24
  Administered 2020-10-22: 2 mg via ORAL
  Filled 2020-10-22: qty 2

## 2020-10-22 MED ORDER — LORAZEPAM 2 MG/ML IJ SOLN: 0.0000 mg | Freq: Four times a day (QID) | INTRAMUSCULAR | Status: AC

## 2020-10-22 MED ORDER — ONDANSETRON HCL 4 MG PO TABS: 4.0000 mg | ORAL_TABLET | Freq: Three times a day (TID) | ORAL | Status: DC | PRN

## 2020-10-22 MED ORDER — THIAMINE HCL 100 MG/ML IJ SOLN: 100.0000 mg | Freq: Every day | INTRAMUSCULAR | Status: DC

## 2020-10-22 MED ORDER — THIAMINE HCL 100 MG PO TABS
100.0000 mg | ORAL_TABLET | Freq: Every day | ORAL | Status: DC
  Administered 2020-10-22 – 2020-10-24 (×3): 100 mg via ORAL
  Filled 2020-10-22 (×3): qty 1

## 2020-10-22 MED ORDER — LORAZEPAM 2 MG/ML IJ SOLN
0.0000 mg | Freq: Two times a day (BID) | INTRAMUSCULAR | Status: DC
Start: 2020-10-24 — End: 2020-10-25

## 2020-10-22 MED ORDER — NICOTINE 21 MG/24HR TD PT24
21.0000 mg | MEDICATED_PATCH | Freq: Every day | TRANSDERMAL | Status: DC
  Administered 2020-10-22 – 2020-10-24 (×3): 21 mg via TRANSDERMAL
  Filled 2020-10-22 (×3): qty 1

## 2020-10-22 MED ORDER — ACETAMINOPHEN 325 MG PO TABS: 650.0000 mg | ORAL_TABLET | ORAL | Status: DC | PRN

## 2020-10-22 NOTE — ED Provider Notes (Signed)
[facility] DEPT Provider Note   CSN: [id number] Arrival date & time: 10/22/20  1406     History Chief Complaint  Patient presents with   Addiction Problem   Suicidal   Hallucinations   Paranoid     Ethan [name] is a 55 y.o. male.  HPI Hx of alcoholism and polysubstance abuse  Cocaine last use 2 days ago  States SI and HI but states "I don't want to hurt anyone else" States plan is overdose.  Has hx of same states this was years ago.   Hears voices . No visual hallucinations.  No NVD. No other sx.       Past Medical History:  Diagnosis Date   Alcoholism ([place])    Anxiety    Depression    GERD (gastroesophageal reflux disease)    Heart murmur    Hepatitis C    Treated in prison for 18 months.  Sounds like Ribaviron and Interferon  2009-2011   Hypertension    Peptic ulcer    Substance abuse ([place])    Urge urinary incontinence    07/20/2015:  evaluated by Dr. Kathie Rhodes, Urodynamics planned    Patient Active Problem List   Diagnosis Date Noted   Major depressive disorder, recurrent, severe with psychotic features ([place])    Healthcare maintenance 02/14/2020   Weight loss, unintentional 11/10/2019   Alcohol use disorder, severe, dependence ([place]) 11/10/2019   History of prediabetes 11/10/2019   Cocaine use disorder, severe, dependence ([place]) 11/27/2016   GERD (gastroesophageal reflux disease) 12/22/2014   Tobacco use disorder 12/22/2014   Cannabis use disorder, severe, dependence ([place]) 12/22/2014   Alcohol use disorder, mild, abuse 12/22/2014   Schizoaffective disorder ([place]) 12/22/2014   History of hepatitis C virus infection 09/01/2013    Past Surgical History:  Procedure Laterality Date   APPENDECTOMY     INGUINAL HERNIA REPAIR Bilateral        Family History  Problem Relation Age of Onset   Breast cancer Sister    Breast cancer Maternal Grandmother    Colon polyps Brother    Diabetes Sister        x 2    Diabetes Maternal Aunt    Diabetes Paternal Aunt    Diabetes Mother        DM complications cause of death   Alcohol abuse Father    COPD Neg Hx     Social History   Tobacco Use   Smoking status: Every Day    Packs/day: 0.50    Years: 12.00    Pack years: 6.00    Types: Cigarettes    Start date: 07/14/1976   Smokeless tobacco: Never   Tobacco comments:    Stopped for 16 years.  Restarted with bout of depression/anxiety recently  Substance Use Topics   Alcohol use: Yes    Alcohol/week: 0.0 standard drinks    Comment: 3 beers nightly   Drug use: Yes    Types: "Crack" cocaine    Comment: Occassionally MJ currently--in past crack cocaine--more than 16 years ago.    Home Medications Prior to Admission medications   Medication Sig Start Date End Date Taking? Authorizing Provider  amLODipine (NORVASC) 5 MG tablet Take 1 tablet (5 mg total) by mouth daily. 11/16/19   Asencion Noble, MD  Blood Pressure Monitoring (BLOOD PRESSURE KIT) DEVI Measure blood pressure twice a day 12/17/19   Jean Rosenthal, MD  hydrOXYzine (ATARAX/VISTARIL) 25 MG tablet Take 1 tablet (25  mg total) by mouth every 6 (six) hours as needed for anxiety. 11/16/19   Asencion Noble, MD  nicotine (NICODERM CQ - DOSED IN MG/24 HOURS) 21 mg/24hr patch Place 1 patch (21 mg total) onto the skin daily. Patient not taking: Reported on 11/15/2019 12/03/16   Derrill Center, NP  pantoprazole (PROTONIX) 40 MG tablet Take 1 tablet (40 mg total) by mouth daily. 09/04/20   Katsadouros, Vasilios, MD  risperiDONE (RISPERDAL) 0.5 MG tablet Take 1 tablet (0.5 mg total) by mouth 2 (two) times daily. 11/16/19   Asencion Noble, MD  traZODone (DESYREL) 50 MG tablet Take 1 tablet (50 mg total) by mouth at bedtime and may repeat dose one time if needed. 11/16/19   Asencion Noble, MD  valsartan-hydrochlorothiazide (DIOVAN-HCT) 160-12.5 MG tablet Take 1 tablet by mouth daily. 11/16/19   Asencion Noble, MD    Allergies    Patient has no  known allergies.  Review of Systems   Review of Systems  Constitutional:  Negative for chills and fever.  HENT:  Negative for congestion.   Respiratory:  Negative for shortness of breath.   Cardiovascular:  Negative for chest pain.  Gastrointestinal:  Negative for abdominal pain.  Musculoskeletal:  Negative for neck pain.  Psychiatric/Behavioral:  Positive for hallucinations and suicidal ideas.    Physical Exam Updated Vital Signs BP (!) 143/96 (BP Location: Left Arm)   Pulse 93   Temp 98.1 F (36.7 C) (Oral)   Resp 18   Ht 5' 9"  (1.753 m)   Wt 73 kg   SpO2 95%   BMI 23.77 kg/m   Physical Exam Vitals and nursing note reviewed.  Constitutional:      General: He is not in acute distress.    Appearance: Normal appearance. He is not ill-appearing.  HENT:     Head: Normocephalic and atraumatic.  Eyes:     General: No scleral icterus.       Right eye: No discharge.        Left eye: No discharge.     Conjunctiva/sclera: Conjunctivae normal.  Pulmonary:     Effort: Pulmonary effort is normal.     Breath sounds: No stridor.  Neurological:     Mental Status: He is alert and oriented to person, place, and time. Mental status is at baseline.  Psychiatric:     Comments: Pleasant Clearly communicates his symptoms of SI and auditory hallucinations    ED Results / Procedures / Treatments   Labs (all labs ordered are listed, but only abnormal results are displayed) Labs Reviewed  RAPID URINE DRUG SCREEN, HOSP PERFORMED - Abnormal; Notable for the following components:      Result Value   Cocaine POSITIVE (*)    Tetrahydrocannabinol POSITIVE (*)    All other components within normal limits  URINALYSIS, ROUTINE W REFLEX MICROSCOPIC - Abnormal; Notable for the following components:   Color, Urine COLORLESS (*)    Specific Gravity, Urine 1.002 (*)    All other components within normal limits  COMPREHENSIVE METABOLIC PANEL - Abnormal; Notable for the following components:   AST  44 (*)    All other components within normal limits  ETHANOL - Abnormal; Notable for the following components:   Alcohol, Ethyl (B) 77 (*)    All other components within normal limits  SALICYLATE LEVEL - Abnormal; Notable for the following components:   Salicylate Lvl <6.3 (*)    All other components within normal limits  ACETAMINOPHEN LEVEL -  Abnormal; Notable for the following components:   Acetaminophen (Tylenol), Serum <10 (*)    All other components within normal limits  RESP PANEL BY RT-PCR (FLU A&B, COVID) ARPGX2  CBC WITH DIFFERENTIAL/PLATELET    EKG None  Radiology No results found.  Procedures Procedures   Medications Ordered in ED Medications  LORazepam (ATIVAN) injection 0-4 mg ( Intravenous See Alternative 10/22/20 1533)    Or  LORazepam (ATIVAN) tablet 0-4 mg (2 mg Oral Given 10/22/20 1533)  LORazepam (ATIVAN) injection 0-4 mg (has no administration in time range)    Or  LORazepam (ATIVAN) tablet 0-4 mg (has no administration in time range)  thiamine tablet 100 mg (100 mg Oral Given 10/22/20 1533)    Or  thiamine (B-1) injection 100 mg ( Intravenous See Alternative 10/22/20 1533)  acetaminophen (TYLENOL) tablet 650 mg (has no administration in time range)  ondansetron (ZOFRAN) tablet 4 mg (has no administration in time range)  nicotine (NICODERM CQ - dosed in mg/24 hours) patch 21 mg (21 mg Transdermal Patch Applied 10/22/20 1533)    ED Course  I have reviewed the triage vital signs and the nursing notes.  Pertinent labs & imaging results that were available during my care of the patient were reviewed by me and considered in my medical decision making (see chart for details).    MDM Rules/Calculators/A&P                          Patient is 55 year old male presented today with suicidal ideation, homicidal thoughts which seem inconsistent  Patient does have issues with substance abuse.  CIWA protocol ordered.  He does have alcohol system currently.  He does  not seem clinically intoxicated however.  From a medical standpoint patient is overall well-appearing.  CBC without leukocytosis or anemia.  CMP unremarkable.  Ethanol 77.  Tylenol salicylate undetectable.  COVID influenza negative.  Urinalysis unremarkable.  Urine drug screen positive for cocaine and THC.  Patient provided with thiamine, NicoDerm patch and given 2 mg of p.o. Ativan.  Resting comfortably at this time.  Patient is currently voluntary.  The ideations believe will require consideration for IVC however seems to be mentating well at this time.  Medically clear.  Awaiting TTS evaluation for final disposition.  Final Clinical Impression(s) / ED Diagnoses Final diagnoses:  Auditory hallucination  Suicidal intent    Rx / DC Orders ED Discharge Orders     None        Tedd Sias, [place] 10/22/20 1906    Daleen Bo, MD 10/22/20 2229

## 2020-10-22 NOTE — ED Triage Notes (Signed)
Patient states he is paranoid, hearing voices telling him to shoot people, scared to be around other people. Has been off of his psych meds. Endorses SI/HI, calm and cooperative in triage. Cocaine use 2 days ago, used ETOH today.

## 2020-10-22 NOTE — BH Assessment (Addendum)
Comprehensive Clinical Assessment (CCA) Note  10/22/2020 Ethan [name] [id number]  Disposition: Per Oneida Alar, NP, recommends continuous assessment for safety and stability as he sobers up and he will be reassessed by a provider in the morning to see if he has continued suicidal ideation.  Patient has been urine incontinent in the ED and would not be appropriate for placement at the [facility].  The patient demonstrates the following risk factors for suicide: Chronic risk factors for suicide include: psychiatric disorder of depression, substance use disorder, previous suicide attempts (multiple), previous self-harm by cutting, and medical illness Hep C, HTN . Acute risk factors for suicide include: family or marital conflict, unemployment, social withdrawal/isolation, and loss (financial, interpersonal, professional). Protective factors for this patient include: hope for the future. Considering these factors, the overall suicide risk at this point appears to be high. Patient is not appropriate for outpatient follow up.   AIMS    Flowsheet Row Admission (Discharged) from 11/27/2016 in [place] [facility]B  AIMS Total Score 0      AUDIT    Flowsheet Row Admission (Discharged) from 11/27/2016 in [place] [facility] Admission (Discharged) from 12/21/2014 in [place]  Alcohol Use Disorder Identification Test Final Score (AUDIT) 39 5      PHQ2-9    Flowsheet Row ED from 10/22/2020 in [place] DEPT ED from 08/06/2020 in [facility] Office Visit from 03/07/2020 in [place] Office Visit from 02/14/2020 in [place] Office Visit from 11/09/2019 in [place]  PHQ-2 Total Score 5 4 4 3 4   PHQ-9 Total Score 20 11 20 12 13       Flowsheet Row ED from 10/22/2020 in [place] DEPT ED from 08/06/2020 in [place] No Risk Error: Q7 should not be populated when Q6 is No        Chief Complaint:  Chief Complaint  Patient presents with   Addiction Problem   Suicidal   Hallucinations   Paranoid   Visit Diagnosis: 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 Self  What Is the Reason for Your Visit/Call Today? Patient presents to [facility] seeking help for his depression, suicidal ideation, paranoia and his drug and alcohol addiction.  Patient states that months of drinking and drugging that it has caused him to be paranoid, feeling like people are out to get him and he states that he has been hearing voices.  Patient states that he has been experiencing suicidal ideation and states that he has had thoughts of cutting or shooting himself.  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  Patient states that he has been going through a lot lately and states that his girlfriend and he are having problems.  He states that he was living with her, but he is not sure that he can return there.  Patient states that he has no current OP [place] and states that is has been a good while since he was hospitalized. Patient denies current HI. Patient statee that he drinks about two 40 oz beers per day  and that he also uses crack cocaine on a weekly basis.  Pt stated that last use of alcohol was today.  Last use of crack cocaine was on yesterday. Patient states that he has been experiencing sleep and appetite disturbance due to his addiction.  He denies any history of DTs or seizures.  While in the ED, however, he has been a little wobbly and he has  been urine incontinent.  Patient states that he is disabled.  He is currently on disability and he states that he has three children.  Patient was last seen in April at the [facility] and was referred dor detoxification services.  He was given resources and his girlfriend was supposed to help him secure services, but that most likely never happened.  Patient is alert and oriented.  His mood is depressed and his affect flat.  His judgment, insight and impulse control are impaired by his drug and alcohol use.  His thoughts are relatively organized.  He does not appear to be responding to any internal stimuli.  His memory appears to be intact  How Long Has This Been Causing You Problems? > than 6 months  What Do You Feel Would Help You the Most Today? Alcohol or Drug Use Treatment; Treatment for Depression or other mood problem   Have You Recently Had Any Thoughts About Hurting Yourself? Yes  Are You Planning to Commit Suicide/Harm Yourself At This time? No   Have you Recently Had Thoughts About [place]? No  Are You Planning to Harm Someone at This Time? No  Explanation: No data recorded  Have You Used Any Alcohol or Drugs in the Past 24 Hours? Yes  How Long Ago Did You Use Drugs or Alcohol? No data recorded What Did You Use and How Much? Drank two forty ounce beers today and used crack cocaine yesterday   Do You Currently Have a Therapist/Psychiatrist? No  Name of Therapist/Psychiatrist: No data recorded  Have You Been Recently Discharged From Any Office Practice or Programs? No  Explanation of Discharge From Practice/Program: No data recorded    CCA Screening Triage Referral Assessment Type of Contact: Tele-Assessment  Telemedicine Service Delivery:   Is this Initial or Reassessment? Initial Assessment  Date Telepsych consult ordered in [facility]:  10/22/20  Time Telepsych consult ordered in [facility]:  1452  Location of Assessment: [facility] ED  Provider Location: [facility]   Collateral Involvement: none available   Does Patient Have a [place]? No data recorded Name and Contact of Legal Guardian: No data recorded If Minor and Not Living with Parent(s), Who has Custody? No data recorded Is CPS involved or ever been involved? Never  Is APS involved or ever been involved? Never   Patient Determined To Be At Risk for Harm To Self or Others Based on Review of Patient Reported Information or Presenting Complaint? Yes, for Self-Harm  Method: No data recorded Availability of Means: No data recorded Intent: No data recorded Notification Required: No data recorded Additional Information for Danger to Others Potential: No data recorded Additional Comments for Danger to Others Potential: No data recorded Are There Guns or Other Weapons in Your Home? No data recorded Types of Guns/Weapons: No data recorded Are These Weapons Safely Secured?                            No data recorded Who Could Verify You Are Able To Have These  Secured: No data recorded Do You Have any Outstanding Charges, Pending Court Dates, Parole/Probation? No data recorded Contacted To Inform of Risk of Harm To Self or Others: Unable to Contact:    Does Patient Present under Involuntary Commitment? No  IVC Papers Initial File Date: No data recorded  [place] of Residence: [place]   Patient Currently Receiving the Following Services: Not Receiving Services   Determination of Need: Emergent (2 hours)   Options For Referral: Inpatient Hospitalization; Outpatient Therapy; Medication Management; Other: Comment ([place] residential treatment)     CCA Biopsychosocial Patient Reported Schizophrenia/Schizoaffective Diagnosis in Past: Yes   Strengths: none reported   Mental Health Symptoms Depression:   Hopelessness; Irritability; Sleep (too much or little)   Duration of Depressive symptoms:  Duration of Depressive Symptoms: Greater than two weeks    Mania:   None   Anxiety:    None   Psychosis:   Hallucinations   Duration of Psychotic symptoms:  Duration of Psychotic Symptoms: Greater than six months   Trauma:   None   Obsessions:   None   Compulsions:   None   Inattention:   None   Hyperactivity/Impulsivity:   N/A   Oppositional/Defiant Behaviors:   None   Emotional Irregularity:   None   Other Mood/Personality Symptoms:   depressed mood    Mental Status Exam Appearance and self-care  Stature:   Average   Weight:   Average weight   Clothing:   Casual   Grooming:   Normal   Cosmetic use:   None   Posture/gait:   Normal   Motor activity:   Not Remarkable   Sensorium  Attention:   Normal   Concentration:   Normal   Orientation:   X5   Recall/memory:   Normal   Affect and Mood  Affect:   Appropriate   Mood:   Negative   Relating  Eye contact:   None   Facial expression:   Responsive   Attitude toward examiner:   Cooperative   Thought and Language  Speech flow:  Clear and Coherent   Thought content:   Appropriate to Mood and Circumstances   Preoccupation:   None   Hallucinations:   Auditory   Organization:  No data recorded  [employer] of Knowledge:   Average   Intelligence:   Average   Abstraction:   Functional   Judgement:   Poor   Reality Testing:   Adequate   Insight:   Poor   Decision Making:   Impulsive   Social Functioning  Social Maturity:   Impulsive   Social Judgement:   Heedless   Stress  Stressors:   Museum/gallery curator; Relationship   Coping Ability:   Exhausted   Skill Deficits:   Responsibility   Supports:   Friends/Service system     Religion: Religion/Spirituality Are You A Religious Person?: No  Leisure/Recreation: Leisure / Recreation Do You Have Hobbies?: No  Exercise/Diet: Exercise/Diet Do You Exercise?: No Have You Gained or Lost A Significant Amount of Weight in the Past Six  Months?: No Do You Follow a Special Diet?: No Do You Have Any Trouble Sleeping?: Yes Explanation of Sleeping Difficulties: 3 hours of sleep per night   CCA Employment/Education Employment/Work Situation: Employment / Work Situation Employment Situation: Unemployed Patient's Job has Been Impacted by Current Illness: No Has Patient ever Been in Passenger transport manager?: No  Education: Education Is Patient Currently Attending School?: No Last Grade Completed:  (UTA) Did You Attend  College?: No Did You Have An Individualized Education Program (IIEP): No Did You Have Any Difficulty At School?: No Patient's Education Has Been Impacted by Current Illness: No   CCA Family/Childhood History Family and Relationship History: Family history Marital status: Single Does patient have children?: Yes How many children?: 3 How is patient's relationship with their children?: distant relationship   Childhood History:  Childhood History By whom was/is the patient raised?: Foster parents, Grandparents Did patient suffer any verbal/emotional/physical/sexual abuse as a child?: Yes Did patient suffer from severe childhood neglect?: No Has patient ever been sexually abused/assaulted/raped as an adolescent or adult?: No Was the patient ever a victim of a crime or a disaster?: No Witnessed domestic violence?: No Has patient been affected by domestic violence as an adult?: No  Child/Adolescent Assessment:     CCA Substance Use Alcohol/Drug Use: Alcohol / Drug Use Pain Medications: Please see MAR Prescriptions: Please see MAR Over the Counter: Please see MAR History of alcohol / drug use?: Yes Longest period of sobriety (when/how long): 16 years Negative Consequences of Use: Financial, Personal relationships Withdrawal Symptoms: Irritability Substance #1 Name of Substance 1: alcohol 1 - Age of First Use: UTA 1 - Amount (size/oz): 2 forties 1 - Frequency: daily 1 - Duration: "months" 1 - Last Use  / Amount: 2 forties today 1 - Method of Aquiring: store 1- Route of Use: oral Substance #2 Name of Substance 2: Cocaine 2 - Age of First Use: 26 or 55 yr old 2 - Amount (size/oz): Varies 2 - Frequency: Weekly 2 - Duration: Ongoing 2 - Last Use / Amount: 2 days ago 2 - Method of Aquiring: off the street 2 - Route of Substance Use: smoke                     ASAM's:  Six Dimensions of Multidimensional Assessment  Dimension 1:  Acute Intoxication and/or Withdrawal Potential:   Dimension 1:  Description of individual's past and current experiences of substance use and withdrawal: Ongoing substance use, no current complaints of withdrawal complications  Dimension 2:  Biomedical Conditions and Complications:   Dimension 2:  Description of patient's biomedical conditions and  complications: Patient has sveral medical conditions that are compromised by his drug and alcohol use  Dimension 3:  Emotional, Behavioral, or Cognitive Conditions and Complications:  Dimension 3:  Description of emotional, behavioral, or cognitive conditions and complications: Per history, Pt has schizoaffective disorder and is non-compliant with treatment  for this condition  Dimension 4:  Readiness to Change:  Dimension 4:  Description of Readiness to Change criteria: Patient states that he is ready to take the steps necessary to change his life  Dimension 5:  Relapse, Continued use, or Continued Problem Potential:  Dimension 5:  Relapse, continued use, or continued problem potential critiera description: Patient has poor coping mechanisms and has been a chronic relapser  Dimension 6:  Recovery/Living Environment:  Dimension 6:  Recovery/Iiving environment criteria description: Lives with his payee, with whom he has conflict  ASAM Severity Score: ASAM's Severity Rating Score: 12  ASAM Recommended Level of Treatment: ASAM Recommended Level of Treatment: Level III Residential Treatment   Substance use Disorder  (SUD) Substance Use Disorder (SUD)  Checklist Symptoms of Substance Use: Persistent desire or unsuccessful efforts to cut down or control use, Continued use despite having a persistent/recurrent physical/psychological problem caused/exacerbated by use, Continued use despite persistent or recurrent social, interpersonal problems, caused or exacerbated by use, Evidence of withdrawal (  Comment), Large amounts of time spent to obtain, use or recover from the substance(s), Recurrent use that results in a failure to fulfill major role obligations (work, school, home), Social, occupational, recreational activities given up or reduced due to use, Substance(s) often taken in larger amounts or over longer times than was intended  Recommendations for Services/Supports/Treatments: Recommendations for Services/Supports/Treatments Recommendations For Services/Supports/Treatments: Residential-Level 3  Discharge Disposition:    DSM5 Diagnoses: Patient Active Problem List   Diagnosis Date Noted   Major depressive disorder, recurrent, severe with psychotic features ([place])    Healthcare maintenance 02/14/2020   Weight loss, unintentional 11/10/2019   Alcohol use disorder, severe, dependence ([place]) 11/10/2019   History of prediabetes 11/10/2019   Cocaine use disorder, severe, dependence ([place]) 11/27/2016   GERD (gastroesophageal reflux disease) 12/22/2014   Tobacco use disorder 12/22/2014   Cannabis use disorder, severe, dependence ([place]) 12/22/2014   Alcohol use disorder, mild, abuse 12/22/2014   Schizoaffective disorder ([place]) 12/22/2014   History of hepatitis C virus infection 09/01/2013     Referrals to Alternative Service(s): Referred to Alternative Service(s):   Place:   Date:   Time:    Referred to Alternative Service(s):   Place:   Date:   Time:    Referred to Alternative Service(s):   Place:   Date:   Time:    Referred to Alternative Service(s):   Place:   Date:   Time:     Shoichi Mielke J Bonham Zingale, LCAS

## 2020-10-22 NOTE — BH Assessment (Signed)
TTS is waiting for all labs to be collected and for ETOH level to come back prior to assessing patient.  MD note is not complete.  TTS will continue to follow.

## 2020-10-22 NOTE — ED Notes (Signed)
Patient is currently sleeping. CIWA -0

## 2020-10-22 NOTE — ED Notes (Signed)
Patient's white belongings bag placed in cabinet 23-25. Inside is a biohazard bag with valuables envelope and key inside

## 2020-10-23 MED ORDER — PANTOPRAZOLE SODIUM 40 MG PO TBEC
40.0000 mg | DELAYED_RELEASE_TABLET | Freq: Every day | ORAL | Status: DC
  Administered 2020-10-23 – 2020-10-24 (×2): 40 mg via ORAL
  Filled 2020-10-23 (×2): qty 1

## 2020-10-23 NOTE — BH Assessment (Signed)
[place] Assessment Progress Note   Per Pecolia Ades, NP this pt presents a danger to himself unless placement can be found in a facility that provides alcohol detoxification.  Pt is therefore not psychiatrically cleared.  Pt acknowledges that he is a registered sex offender.  At 13:58 this Probation officer called Randell Patient at [facility] [phone]); they have a suitable facility in their program.  Call rolled to voice mail and I left a message.  Sometime later she called back, advising me to fax referral information to them, which I did.  At 16:05 I called to confirm receipt.  Charlene could neither confirm nor deny, but says she will check fax.  Pt will remain at [facility] pending appropriate placement.  EDP Lacretia Leigh, MD and pt's nurse, Wells Guiles, have been notified.  Jalene Mullet, [place] Coordinator [phone]

## 2020-10-23 NOTE — ED Notes (Signed)
Pt sleeping, NADN, RR e/u, rise and fall of chest noted.

## 2020-10-23 NOTE — Consult Note (Signed)
[place] Psychiatry Consult   Reason for Consult:  psychiatric evaluation Referring Physician:  Dr. Karle Starch, St[place] Patient Identification: Ethan [name] MRN:  [id number] Principal Diagnosis: <principal problem not specified> Diagnosis:  Active Problems:   Cocaine use disorder, severe, dependence ([place])   Alcohol use disorder, severe, dependence ([place])   Total Time spent with patient: 30 minutes  Subjective:   Ethan [name] is a 55 y.o. male patient admitted with per admit note:    Patient presents to [facility] seeking help for his depression, suicidal ideation, paranoia and his drug and alcohol addiction.  Patient states that months of drinking and drugging that it has caused him to be paranoid, feeling like people are out to get him and he states that he has been hearing voices.  Patient states that he has been experiencing suicidal ideation and states that he has had thoughts of cutting or shooting himself.  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 patient reports he is hopeless.  Reports that his fiance started seeing other people and drinking and doing drugs over a year ago.  He has been living with his fiance up until recently and continuing to do drugs with her.  Patient is alert and oriented to person place time and situation, has good eye contact throughout interview.  He is sitting calmly in a chair in the emergency department.  He is cooperating with interview.  Describes his mood as hopeless with congruent affect.  Speech normal, volume normal.  He reports that he feels he can be safe if he can get into some long-term treatment.  He reports that he does experience auditory hallucinations they tell him he better take them out.  He endorses suicidal ideation with intent to die, he plans to shoot  himself, stab himself or take pills.  Reports that his friends have weapons and he has access to those weapons.  He is unable to contract for safety.  He reports that he also has homicidal ideations, he does not have anyone in particular he wants to hurt but he knows he is capable of hurting someone.  Endorses cocaine use 2 days ago endorses alcohol use, he drinks 440 ounce beers per day.  Last alcohol intake was yesterday.  Reports it has been a long time since he has seen a psychiatrist, he is supposed to see a therapist through [facility] health but he does not.  He is legally blind, has Fish farm manager, he is homeless because he cannot go back to the home secondary to drug use in the home.  He is supposed to be on current medications he is not taking these medications at this time.  He endorses that the medications make him feel worse.  Last rehab was at the beginning of this year for 28 days at [facility]  To note patient is a registered sex offender.  He reports that if he is discharged to the streets today "I bet you will listen to me then "  Past Psychiatric History:   Risk to Self:  yes Risk to Others:  yes Prior Inpatient Therapy:   Prior Outpatient Therapy:    Past Medical History:  Past Medical History:  Diagnosis Date   Alcoholism ([place])    Anxiety    Depression  GERD (gastroesophageal reflux disease)    Heart murmur    Hepatitis C    Treated in prison for 18 months.  Sounds like Ribaviron and Interferon  2009-2011   Hypertension    Peptic ulcer    Substance abuse ([place])    Urge urinary incontinence    07/20/2015:  evaluated by Dr. Kathie Rhodes, Urodynamics planned    Past Surgical History:  Procedure Laterality Date   APPENDECTOMY     INGUINAL HERNIA REPAIR Bilateral    Family History:  Family History  Problem Relation Age of Onset   Breast cancer Sister    Breast cancer Maternal Grandmother    Colon polyps Brother    Diabetes Sister        x 2   Diabetes Maternal Aunt     Diabetes Paternal Aunt    Diabetes Mother        DM complications cause of death   Alcohol abuse Father    COPD Neg Hx    Family Psychiatric  History:  Social History:  Social History   Substance and Sexual Activity  Alcohol Use Yes   Alcohol/week: 0.0 standard drinks   Comment: 3 beers nightly     Social History   Substance and Sexual Activity  Drug Use Yes   Types: "Crack" cocaine   Comment: Occassionally MJ currently--in past crack cocaine--more than 16 years ago.    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 [employer]  Tobacco Use   Smoking status: Every Day    Packs/day: 0.50    Years: 12.00    Pack years: 6.00    Types: Cigarettes    Start date: 07/14/1976   Smokeless tobacco: Never   Tobacco comments:    Stopped for 16 years.  Restarted with bout of depression/anxiety recently  Substance and Sexual Activity   Alcohol use: Yes    Alcohol/week: 0.0 standard drinks    Comment: 3 beers nightly   Drug use: Yes    Types: "Crack" cocaine    Comment: Occassionally MJ currently--in past crack cocaine--more than 16 years ago.   Sexual activity: Yes    Partners: Female    Birth control/protection: None    Comment: WIFE USED MAJUANA SATURDAY  Other Topics Concern   Not on file  Social History Narrative   Lost job and schooling secondary to "past"   Apparently for what he was incarcerated for.   Social Determinants of Health   Financial Resource Strain: High Risk   Difficulty of Paying Living Expenses: Hard  Food Insecurity: Food Insecurity Present   Worried About Running Out of Food in the Last Year: Sometimes true   Ran Out of Food in the Last Year: Sometimes true  Transportation Needs: No Transportation Needs   Lack of  Transportation (Medical): No   Lack of Transportation (Non-Medical): No  Physical Activity: Insufficiently Active   Days of Exercise per Week: 1 day   Minutes of Exercise per Session: 30 min  Stress: Not on file  Social Connections: Unknown   Frequency of Communication with Friends and Family: More than three times a week   Frequency of Social Gatherings with Friends and Family: More than three times a week  Attends Religious Services: Not on file   Active Member of Clubs or Organizations: Not on file   Attends Club or Organization Meetings: Not on file   Marital Status: Not on file   Additional Social History:    Allergies:  No Known Allergies  Labs:  Results for orders placed or performed during the hospital encounter of 10/22/20 (from the past 48 hour(s))  Rapid urine drug screen (hospital performed)     Status: Abnormal   Collection Time: 10/22/20  3:00 PM  Result Value Ref Range   Opiates NONE DETECTED NONE DETECTED   Cocaine POSITIVE (A) NONE DETECTED   Benzodiazepines NONE DETECTED NONE DETECTED   Amphetamines NONE DETECTED NONE DETECTED   Tetrahydrocannabinol POSITIVE (A) NONE DETECTED   Barbiturates NONE DETECTED NONE DETECTED    Comment: (NOTE) DRUG SCREEN FOR MEDICAL PURPOSES ONLY.  IF CONFIRMATION IS NEEDED FOR ANY PURPOSE, NOTIFY LAB WITHIN 5 DAYS.  LOWEST DETECTABLE LIMITS FOR URINE DRUG SCREEN Drug Class                     Cutoff (ng/mL) Amphetamine and metabolites    1000 Barbiturate and metabolites    200 Benzodiazepine                 741 Tricyclics and metabolites     300 Opiates and metabolites        300 Cocaine and metabolites        300 THC                            50 Performed at [facility], [place] [address] [place], [place] [address]   Urinalysis, Routine w reflex microscopic     Status: Abnormal   Collection Time: 10/22/20  3:00 PM  Result Value Ref Range   Color, Urine COLORLESS (A) YELLOW   APPearance CLEAR CLEAR    Specific Gravity, Urine 1.002 (L) 1.005 - 1.030   pH 6.0 5.0 - 8.0   Glucose, UA NEGATIVE NEGATIVE mg/dL   Hgb urine dipstick NEGATIVE NEGATIVE   Bilirubin Urine NEGATIVE NEGATIVE   Ketones, ur NEGATIVE NEGATIVE mg/dL   Protein, ur NEGATIVE NEGATIVE mg/dL   Nitrite NEGATIVE NEGATIVE   Leukocytes,Ua NEGATIVE NEGATIVE    Comment: Performed at [facility] [address] [place], [place] [address]  Comprehensive metabolic panel     Status: Abnormal   Collection Time: 10/22/20  3:28 PM  Result Value Ref Range   Sodium 138 135 - 145 mmol/L   Potassium 3.7 3.5 - 5.1 mmol/L   Chloride 107 98 - 111 mmol/L   CO2 22 22 - 32 mmol/L   Glucose, Bld 84 70 - 99 mg/dL    Comment: Glucose reference range applies only to samples taken after fasting for at least 8 hours.   BUN 14 6 - 20 mg/dL   Creatinine, Ser 1.07 0.61 - 1.24 mg/dL   Calcium 9.1 8.9 - 10.3 mg/dL   Total Protein 7.7 6.5 - 8.1 g/dL   Albumin 4.3 3.5 - 5.0 g/dL   AST 44 (H) 15 - 41 U/L   ALT 28 0 - 44 U/L   Alkaline Phosphatase 90 38 - 126 U/L   Total Bilirubin 0.6 0.3 - 1.2 mg/dL   GFR, Estimated >60 >60 mL/min    Comment: (NOTE) Calculated using the CKD-EPI Creatinine Equation (2021)    Anion gap 9 5 - 15  Comment: Performed at [facility], [place] [address], [place], [place] [address]  Ethanol     Status: Abnormal   Collection Time: 10/22/20  3:28 PM  Result Value Ref Range   Alcohol, Ethyl (B) 77 (H) <10 mg/dL    Comment: (NOTE) Lowest detectable limit for serum alcohol is 10 mg/dL.  For medical purposes only. Performed at [facility], [place] [address], [place], [place] [address]   CBC with Diff     Status: None   Collection Time: 10/22/20  3:28 PM  Result Value Ref Range   WBC 6.6 4.0 - 10.5 K/uL   RBC 5.05 4.22 - 5.81 MIL/uL   Hemoglobin 14.2 13.0 - 17.0 g/dL   HCT 44.5 39.0 - 52.0 %   MCV 88.1 80.0 - 100.0 fL   MCH 28.1 26.0 - 34.0 pg   MCHC 31.9  30.0 - 36.0 g/dL   RDW 13.3 11.5 - 15.5 %   Platelets 236 150 - 400 K/uL   nRBC 0.0 0.0 - 0.2 %   Neutrophils Relative % 60 %   Neutro Abs 4.0 1.7 - 7.7 K/uL   Lymphocytes Relative 28 %   Lymphs Abs 1.8 0.7 - 4.0 K/uL   Monocytes Relative 7 %   Monocytes Absolute 0.5 0.1 - 1.0 K/uL   Eosinophils Relative 4 %   Eosinophils Absolute 0.3 0.0 - 0.5 K/uL   Basophils Relative 0 %   Basophils Absolute 0.0 0.0 - 0.1 K/uL   Immature Granulocytes 1 %   Abs Immature Granulocytes 0.03 0.00 - 0.07 K/uL    Comment: Performed at [facility], [place] [address] [place], [place] [address]  Salicylate level     Status: Abnormal   Collection Time: 10/22/20  3:28 PM  Result Value Ref Range   Salicylate Lvl <2.2 (L) 7.0 - 30.0 mg/dL    Comment: Performed at [facility], [place] [address] [place], [place] [address]  Acetaminophen level     Status: Abnormal   Collection Time: 10/22/20  3:28 PM  Result Value Ref Range   Acetaminophen (Tylenol), Serum <10 (L) 10 - 30 ug/mL    Comment: (NOTE) Therapeutic concentrations vary significantly. A range of 10-30 ug/mL  may be an effective concentration for many patients. However, some  are best treated at concentrations outside of this range. Acetaminophen concentrations >150 ug/mL at 4 hours after ingestion  and >50 ug/mL at 12 hours after ingestion are often associated with  toxic reactions.  Performed at [facility], [place] [address] [place],  [address]   Resp Panel by RT-PCR (Flu A&B, Covid) Nasopharyngeal Swab     Status: None   Collection Time: 10/22/20  3:35 PM   Specimen: Nasopharyngeal Swab; Nasopharyngeal(NP) swabs in vial transport medium  Result Value Ref Range   SARS Coronavirus 2 by RT PCR NEGATIVE NEGATIVE    Comment: (NOTE) SARS-CoV-2 target nucleic acids are NOT DETECTED.  The SARS-CoV-2 RNA is generally detectable in upper respiratory specimens during the acute phase of  infection. The lowest concentration of SARS-CoV-2 viral copies this assay can detect is 138 copies/mL. A negative result does not preclude SARS-Cov-2 infection and should not be used as the sole basis for treatment or other patient management decisions. A negative result may occur with  improper specimen collection/handling, submission of specimen other than nasopharyngeal swab, presence of viral mutation(s) within the areas targeted by this assay, and inadequate number of viral copies(<138 copies/mL).  A negative result must be combined with clinical observations, patient history, and epidemiological information. The expected result is Negative.  Fact Sheet for Patients:  EntrepreneurPulse.com.au  Fact Sheet for Healthcare Providers:  IncredibleEmployment.be  This test is no t yet approved or cleared by the [place] FDA and  has been authorized for detection and/or diagnosis of SARS-CoV-2 by FDA under an Emergency Use Authorization (EUA). This EUA will remain  in effect (meaning this test can be used) for the duration of the COVID-19 declaration under Section 564(b)(1) of the Act, 21 U.S.C.section 360bbb-3(b)(1), unless the authorization is terminated  or revoked sooner.       Influenza A by PCR NEGATIVE NEGATIVE   Influenza B by PCR NEGATIVE NEGATIVE    Comment: (NOTE) The Xpert Xpress SARS-CoV-2/FLU/RSV plus assay is intended as an aid in the diagnosis of influenza from Nasopharyngeal swab specimens and should not be used as a sole basis for treatment. Nasal washings and aspirates are unacceptable for Xpert Xpress SARS-CoV-2/FLU/RSV testing.  Fact Sheet for Patients: EntrepreneurPulse.com.au  Fact Sheet for Healthcare Providers: IncredibleEmployment.be  This test is not yet approved or cleared by the [place] FDA and has been authorized for detection and/or diagnosis of SARS-CoV-2 by FDA  under an Emergency Use Authorization (EUA). This EUA will remain in effect (meaning this test can be used) for the duration of the COVID-19 declaration under Section 564(b)(1) of the Act, 21 U.S.C. section 360bbb-3(b)(1), unless the authorization is terminated or revoked.  Performed at [facility], [place] [address], [place], [place] [address]     Current Facility-Administered Medications  Medication Dose Route Frequency Provider Last Rate Last Admin   acetaminophen (TYLENOL) tablet 650 mg  650 mg Oral Q4H PRN Fondaw, Wylder S, PA       LORazepam (ATIVAN) injection 0-4 mg  0-4 mg Intravenous Q6H Fondaw, Wylder S, PA       Or   LORazepam (ATIVAN) tablet 0-4 mg  0-4 mg Oral Q6H Fondaw, Wylder S, PA   2 mg at 10/22/20 1533   [START ON 10/24/2020] LORazepam (ATIVAN) injection 0-4 mg  0-4 mg Intravenous Q12H Pati Gallo S, PA       Or   [START ON 10/24/2020] LORazepam (ATIVAN) tablet 0-4 mg  0-4 mg Oral Q12H Fondaw, Wylder S, PA       nicotine (NICODERM CQ - dosed in mg/24 hours) patch 21 mg  21 mg Transdermal Daily Pati Gallo S, PA   21 mg at 10/23/20 0952   ondansetron (ZOFRAN) tablet 4 mg  4 mg Oral Q8H PRN Pati Gallo S, PA       pantoprazole (PROTONIX) EC tablet 40 mg  40 mg Oral Daily Lacretia Leigh, MD   40 mg at 10/23/20 1716   thiamine tablet 100 mg  100 mg Oral Daily Tedd Sias, PA   100 mg at 10/23/20 7425   Or   thiamine (B-1) injection 100 mg  100 mg Intravenous Daily Pati Gallo S, PA       Current Outpatient Medications  Medication Sig Dispense Refill   pantoprazole (PROTONIX) 40 MG tablet Take 1 tablet (40 mg total) by mouth daily. 90 tablet 1   amLODipine (NORVASC) 5 MG tablet Take 1 tablet (5 mg total) by mouth daily. (Patient not taking: Reported on 10/23/2020) 90 tablet 1   hydrOXYzine (ATARAX/VISTARIL) 25 MG tablet Take 1 tablet (25 mg total) by mouth every 6 (six) hours as needed for anxiety. (Patient not taking:  Reported on 10/23/2020)  30 tablet 0   nicotine (NICODERM CQ - DOSED IN MG/24 HOURS) 21 mg/24hr patch Place 1 patch (21 mg total) onto the skin daily. (Patient not taking: No sig reported) 28 patch 0   risperiDONE (RISPERDAL) 0.5 MG tablet Take 1 tablet (0.5 mg total) by mouth 2 (two) times daily. (Patient not taking: Reported on 10/23/2020) 60 tablet 0   traZODone (DESYREL) 50 MG tablet Take 1 tablet (50 mg total) by mouth at bedtime and may repeat dose one time if needed. (Patient not taking: Reported on 10/23/2020) 30 tablet 0   valsartan-hydrochlorothiazide (DIOVAN-HCT) 160-12.5 MG tablet Take 1 tablet by mouth daily. (Patient not taking: Reported on 10/23/2020) 90 tablet 1    Musculoskeletal: Strength & Muscle Tone: within normal limits Gait & Station: normal Patient leans: N/A    Psychiatric Specialty Exam:  Presentation  General Appearance: Appropriate for Environment  Eye Contact:Fair  Speech:Clear and Coherent  Speech Volume:Normal  Handedness:Right   Mood and Affect  Mood:Worthless; Depressed  Affect:Congruent   Thought Process  Thought Processes:Coherent  Descriptions of Associations:Intact  Orientation:Full (Time, Place and Person)  Thought Content:Logical  History of Schizophrenia/Schizoaffective disorder:Yes  Duration of Psychotic Symptoms:Greater than six months  Hallucinations:Hallucinations: Auditory Description of Auditory Hallucinations: "better take them out" Ideas of Reference:None  Suicidal Thoughts:Suicidal Thoughts: Yes, Active SI Active Intent and/or Plan: With Intent; With Plan; With Means to Carry Out Homicidal Thoughts:Homicidal Thoughts: Yes, Passive HI Passive Intent and/or Plan: Without Intent; Without Access to Means (friends have weapons and wont give them to me)  [place]; Recent [place]; Remote [place]  Insight:Fair   Executive Functions  Concentration:Good  Attention Span:Good  [place] of  Knowledge:Good  Language:Good   Psychomotor Activity  Psychomotor Activity: Psychomotor Activity: Normal  Assets  Assets:Communication Skills; Desire for Improvement; Financial Resources/Insurance   Sleep  Sleep: Sleep: Fair  Physical Exam: Physical Exam Constitutional:      General: He is not in acute distress. Cardiovascular:     Rate and Rhythm: Normal rate.  Pulmonary:     Effort: Pulmonary effort is normal.  Skin:    General: Skin is warm and dry.  Neurological:     Mental Status: He is alert and oriented to person, place, and time.  Psychiatric:        Mood and Affect: Mood is depressed. Affect is flat.        Behavior: Behavior is cooperative.        Thought Content: Thought content is not paranoid or delusional. Thought content includes suicidal ideation. Thought content does not include homicidal ideation. Thought content includes suicidal plan. Thought content does not include homicidal plan.        Cognition and Memory: Cognition normal.   Review of Systems  Constitutional:  Negative for chills and fever.  Respiratory:  Negative for shortness of breath.   Cardiovascular:  Negative for chest pain and palpitations.  Gastrointestinal:  Negative for abdominal pain, nausea and vomiting.  Neurological:  Negative for headaches.  Psychiatric/Behavioral:  Positive for depression, hallucinations, substance abuse and suicidal ideas. The patient has insomnia.   Blood pressure (!) 143/100, pulse 75, temperature 98.4 F (36.9 C), temperature source Oral, resp. rate 18, height 5\' 9"  (1.753 m), weight 73 kg, SpO2 99 %. Body mass index is 23.77 kg/m.  Treatment Plan Summary: Daily contact with patient to assess and evaluate symptoms and progress in treatment, Medication management.   Disposition: Refer to residential  treatment center. Patient is unable to contract for safety without services in place. Triage specialist attempting to get patient into appropriate treatment  facility. Patient is not psychiatrically cleared at this time.   Chalmers Guest, NP 10/23/2020 5:23 PM

## 2020-10-23 NOTE — ED Provider Notes (Signed)
Emergency Medicine Observation Re-evaluation Note  Ethan [name] is a 55 y.o. male, seen on rounds today.  Pt initially presented to the ED for complaints of Addiction Problem, Suicidal, Hallucinations, and Paranoid Currently, the patient is sleeping.  Physical Exam  BP 128/78   Pulse 77   Temp 98.2 F (36.8 C) (Oral)   Resp 16   Ht 5\' 9"  (1.753 m)   Wt 73 kg   SpO2 97%   BMI 23.77 kg/m  Physical Exam General: No distress Lungs: Resp even and unlabored Psych: Sleeping soundly  ED Course / MDM  EKG:   I have reviewed the labs performed to date as well as medications administered while in observation.  Recent changes in the last 24 hours include none.  Plan  Current plan is for Psych re-evaluation. Patient is not under full IVC at this time.   Truddie Hidden, MD 10/23/20 [phone]

## 2020-10-23 NOTE — ED Notes (Signed)
Patient is currently sleeping. CIWA-O

## 2020-10-24 ENCOUNTER — Other Ambulatory Visit (HOSPITAL_COMMUNITY): Payer: Self-pay

## 2020-10-24 MED ORDER — LORAZEPAM 1 MG PO TABS
1.0000 mg | ORAL_TABLET | Freq: Once | ORAL | Status: AC
  Administered 2020-10-24: 1 mg via ORAL
  Filled 2020-10-24: qty 1

## 2020-10-24 MED ORDER — VALSARTAN-HYDROCHLOROTHIAZIDE 160-12.5 MG PO TABS
1.0000 | ORAL_TABLET | Freq: Every day | ORAL | 0 refills | Status: DC
  Filled 2020-10-24: qty 30, 30d supply, fill #0

## 2020-10-24 MED ORDER — TRAZODONE HCL 50 MG PO TABS
50.0000 mg | ORAL_TABLET | Freq: Every evening | ORAL | 0 refills | Status: DC | PRN
  Filled 2020-10-24: qty 60, 30d supply, fill #0

## 2020-10-24 MED ORDER — RISPERIDONE 0.5 MG PO TABS
0.5000 mg | ORAL_TABLET | Freq: Two times a day (BID) | ORAL | 0 refills | Status: DC
  Filled 2020-10-24: qty 60, 30d supply, fill #0

## 2020-10-24 MED ORDER — LORAZEPAM 1 MG PO TABS
2.0000 mg | ORAL_TABLET | Freq: Once | ORAL | Status: AC
  Administered 2020-10-24: 2 mg via ORAL
  Filled 2020-10-24: qty 2

## 2020-10-24 MED ORDER — NICOTINE 21 MG/24HR TD PT24: 21.0000 mg | MEDICATED_PATCH | Freq: Every day | TRANSDERMAL | 0 refills | Status: DC

## 2020-10-24 MED ORDER — PANTOPRAZOLE SODIUM 40 MG PO TBEC
40.0000 mg | DELAYED_RELEASE_TABLET | Freq: Every day | ORAL | 0 refills | Status: DC
  Filled 2020-10-24 (×2): qty 30, 30d supply, fill #0

## 2020-10-24 MED ORDER — AMLODIPINE BESYLATE 5 MG PO TABS
5.0000 mg | ORAL_TABLET | Freq: Every day | ORAL | 0 refills | Status: DC
  Filled 2020-10-24: qty 30, 30d supply, fill #0

## 2020-10-24 MED ORDER — HYDROXYZINE HCL 25 MG PO TABS
25.0000 mg | ORAL_TABLET | Freq: Four times a day (QID) | ORAL | 0 refills | Status: DC | PRN
  Filled 2020-10-24: qty 120, 30d supply, fill #0

## 2020-10-24 NOTE — BH Assessment (Signed)
[facility] Assessment Progress Note   Per Pecolia Ades, NP, this pt does not require psychiatric hospitalization at this time and he is psychiatrically cleared.  However, he would benefit from residential substance use disorder treatment.   At 15:11 I spoke to June at the [facility] facility in [facility].  Pt has been accepted to their facility.  He will need to arrive tomorrow, 10/25/2020 before 09:00.  He will need to have a minimum of two week's supply of necessary medications  with him.  Santiago Glad, concurs with this disposition, as does the pt who is currently under voluntary status.  EDP Lorre Munroe, MD and pt's nurse, Denton Ar, have been notified.  Pt is to be transported via [facility]	[place], to which Dr Joya Gaskins also agrees.  Report does not need to be called, but discharge instructions and tomorrow's MAR need to be faxed to [facility] at [phone].  Jalene Mullet, [place] Coordinator [phone]

## 2020-10-24 NOTE — ED Notes (Signed)
Pt's dinner tray has arrived. Pt sat up and ate all of his food. Will continue to monitor pt.

## 2020-10-24 NOTE — Progress Notes (Signed)
Ethan [name], [place] #[id number] (CSN: [id number]) (55 y.o. M) (Adm: 10/22/20) [id number]  PCP  Johnney Ou, [place]  Demographics Comment     Address  [place] [address]   Home Phone  [phone]   Work Phone     Mobile Phone  [phone]         Social Security Number  [id number]   Insurance Information  MEDICAID [place]   Marital Status  Single   Religion  Holiness           Basic Information  Date Of Birth  09/30/1965 Gender Identity  Male Race  Black or African American Ethnic Group  Not Hispanic or Latino Preferred Language  English    Documents Filed to Patient  Power of Attorney Living Will Clinical Unknown Study Attachment Consent Form ABN Waiver After Visit Summary Lab Result Scan Code Status MyChart Status Advance Care Planning   Not on File  Not on File  Not on File  Not on File  Filed  Not on File  Filed  Not on File  FULL [Updated on 10/22/20 1448]  Pending Jump to the Activity     Admission Information   Current Information  Attending Provider Admitting Provider Admission Type Admission Status  Default, Provider, MD  Emergency Confirmed Admission - ED Roomed        Admission Date/Time Discharge Date Hospital Service Auth/Cert Status  93/71/69  02:13 PM  Psychiatry Incomplete        Hospital Area Unit Room/Bed   [place] DEPT [facility] Account  Name Acct ID Class Status Primary Coverage  Lyman, B[place] [id number] Emergency Open MEDICAID [place]        Guarantor Account (for Hospital Account [id number])  Name Relation to Mucarabones? Acct Type  Waldemar Dickens Self [facility] Yes Personal/Family  Address Phone    [address] [place], [place] [address] [phone])          Coverage Information (for Hospital Account [id number])  F/O Payor/Plan Precert #  MEDICAID [place]/MEDICAID [place] ACCESS    Subscriber Subscriber #  Davin, Archuletta [id number]  Address Phone  [address]  [place], [place] [address] [phone]         Care Everywhere ID:  [id number]

## 2020-10-24 NOTE — ED Notes (Signed)
This Probation officer provided pt a hospital packet that has: pen, word search puzzles and a booklet. Will continue to monitor pt.

## 2020-10-24 NOTE — Discharge Instructions (Addendum)
To help you maintain a sober lifestyle, a substance abuse treatment program may be beneficial to you.  Contact one of the following providers at your earliest opportunity to ask about enrolling their program:  RESIDENTIAL PROGRAMS:       [place]      [place], [place] [address]      [phone]       [facility]      [place], [place] [address]      [phone]       [facility]      [place], [place] [address]      [phone]  [place]:       [facility]      [place], [place] [address]      [phone]      Ask about their Substance Abuse Intensive Outpatient Program.  They also offer psychiatry/medication management and therapy.  New patients are seen in their walk-in clinic.  Walk-in hours are Monday - Thursday from 8:00 am - 11:00 am for psychiatry, and Friday from 1:00 pm - 4:00 pm for therapy.  Walk-in patients are seen on a first come, first served basis, so try to arrive as early as possible for the best chance of being seen the same day.

## 2020-10-24 NOTE — ED Notes (Signed)
1222: pts lunch has arrived, pt sitting up and eating his lunch.

## 2020-10-24 NOTE — ED Provider Notes (Signed)
Emergency Medicine Observation Re-evaluation Note  Ethan [name] is a 55 y.o. male, seen on rounds today.  Pt initially presented to the ED for complaints of Addiction Problem, Suicidal, Hallucinations, and Paranoid Currently, the patient is awake and cooperative.  Physical Exam  BP (!) 145/98 (BP Location: Right Arm)   Pulse 82   Temp 98.6 F (37 C) (Oral)   Resp 18   Ht 5\' 9"  (1.753 m)   Wt 73 kg   SpO2 98%   BMI 23.77 kg/m  Physical Exam General: No acute distress Cardiac: Well-perfused Lungs: Nonlabored Psych: Cooperative  ED Course / MDM  EKG:   I have reviewed the labs performed to date as well as medications administered while in observation.  Recent changes in the last 24 hours include psychiatry and TOC evaluations.  Plan  Current plan is for looking toward inpatient substance and psychiatric services. Patient is not under full IVC at this time.   Hayden Rasmussen, MD 10/24/20 [phone]

## 2020-10-24 NOTE — ED Notes (Addendum)
1422:Pt used his second phone call to his ex-girlfriend. Pt remained calm and cooperative throughout entire time regardless of ex-girlfriend being a trigger for him. Pt now back on his bed, eating a second tray of food Will continue to monitor pt.   1446: This writer noticed pt's mood and behavior was off after this Probation officer had gone for lunch. When speaking to pt, pt stated "I feel like a bad feeling. Like something bad is going to happen. I just, I don't know". This Probation officer asked pt if it was the phone call, which he denied. Pt only stated "you know when you feel your anxiety go up? Like something is going to bother you in the bad way and you don't know how to stop it?". This writer attempted to talk to the pt a little more, but pt kept stating he felt bad. This Primary school teacher. Pt did ask for something to help him calm down.

## 2020-10-24 NOTE — ED Notes (Signed)
Pt requesting sleep medication to help sleep, Denton Ar RN aware. Will continue to monitor pt.

## 2020-10-24 NOTE — ED Notes (Signed)
Pt spoke to the NP in TCU 32. Pt back on his bed, will continue to monitor pt

## 2020-10-24 NOTE — ED Notes (Addendum)
6381: Pt's breakfast tray has arrived.  0900: Pt ate 100% of his breakfast tray. After pt was done, pt spoke to this Probation officer and stated "I honestly cannot find any love, my ex-girlfriend has left me. My kids are never around and I am broke. I have nowhere to go. I could not care less if I was to die, everyday is the same day". This Probation officer tried to redirect the pt to other thoughts; his new placement, work (which he enjoys) and meeting new people. Pt then said "I really enjoy working but if my day is always the same- wake up, eat, work and sleep. Then repeat, I don't want to be a part of it. Work is fun, I enjoy my coworkers and what I do but I do not enjoy anything else in life. I was in prison for 14.5 years. When I got out I told myself that I needed to be a better person after having 2 alcoholics parents who never cared for [place]. But that can't happen if I live with an ex-girlfriend, who doesn't love me. Like there isn't no reason to continue". This Probation officer explained to pt that if he enjoy working, then that is a reason to continue and hopefully his new placement will have a caseworker that can help him find a place to stay after the placement. Pt was hopeful about placement helping him, but still continues to voice thoughts of not living anymore. Pt has been calm, cooperative and pleasant. Will continue to monitor pt.

## 2020-10-24 NOTE — ED Notes (Signed)
Pt was nervous about loosing his job, this Probation officer helped find pt his work's place phone number. 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 Pt feels better and requested to have a paper excusing him from work whenever he gets discharged. Will continue to monitor pt.

## 2020-10-24 NOTE — Consult Note (Signed)
Ethan [name], is a 55 y.o male, seen face to face by this provider at [facility] ED. Patient reports, "I still need treatment, and I feel that if I go back out there I will end of dead". Eye contact good throughout interview, upon arrival to ED stretcher, patient was having a conversation with his safety sitter and smiling with her. Cooperative with interview and sitting calmly in chair. Speech normal, volume normal. Denies suicidal ideation, no intent, no plan, denies homicidal ideation. Denies auditory and visual hallucinations. Endorses needing rehab for his substance and alcohol use. Last used crack cocaine and alcohol on Sunday at 1000. Endorses that he typically drinks four 40 ounce beers per week.  He is not reporting any symptoms of alcohol withdrawal. No noticeable signs observed today by this provider. Updated triage specialist. Rehab facilities to be sought for this patient per specialist.

## 2020-10-24 NOTE — ED Notes (Signed)
Pt walked to the bathroom, without any difficulty. When arrived back to his bed, pt asked for a cup of coffee. This Probation officer provided pt with a cup of coffee, will continue to monitor pt.

## 2020-10-24 NOTE — ED Notes (Signed)
Pt asked for a snack, this writer provided pt with a sandwich and 2 orange juices. Allowed by Denton Ar RN. Will continue to monitor pt.

## 2020-10-24 NOTE — Progress Notes (Addendum)
TOC CM spoke to Ethan [name] at bedside. States he lives with his girlfriend but states he will not be able to go back. States he does not have an appt with [facility] IM PCP. TOC CM checked and telephone call is with his [facility] CM and can be rescheduled after his treatment is complete. TOC CM sent message to [facility] CM to reschedule or cancel appt. Ethan [name] states he wants to go to treatment program at [facility]. Attempted call to [facility] x 2 and no answer. Will have [place], Marcello Moores H to arrange [employer] to [facility]. TOC CM picked up medications for [facility] and were given to Ethan [name]'s ED RN, Lesly Rubenstein (meds to be locked up with his other belongings in security) to go with Ethan [name] tomorrow to facility. Jonnie Finner RN CCM, [facility] ED TOC CM [phone]   Meds include,  amLODipine (NORVASC) 5 MG tablet  Sig: Take 1 tablet (5 mg total) by mouth daily.    Start: 10/24/20    Quantity: 30 tablet Refills: 0        hydrOXYzine (ATARAX/VISTARIL) 25 MG tablet  Sig: Take 1 tablet (25 mg total) by mouth every 6 hours as needed for anxiety.    Start: 10/24/20    Quantity: 120 tablet Refills: 0        pantoprazole (PROTONIX) 40 MG tablet  Sig: Take 1 tablet (40 mg total) by mouth daily.    Start: 10/24/20    Quantity: 30 tablet Refills: 0        risperiDONE (RISPERDAL) 0.5 MG tablet  Sig: Take 1 tablet (0.5 mg total) by mouth 2 (two) times daily.    Start: 10/24/20    Quantity: 60 tablet Refills: 0        traZODone (DESYREL) 50 MG tablet  Sig: Take 1 tablet (50 mg total) by mouth at bedtime and may repeat dose one time if needed.    Start: 10/24/20    Quantity: 60 tablet Refills: 0        valsartan-hydrochlorothiazide (DIOVAN-HCT) 160-12.5 MG tablet  Sig: Take 1 tablet by mouth daily.    Start: 10/24/20    Quantity: 30 tablet Refills: 0

## 2020-10-24 NOTE — ED Notes (Signed)
Pt was told that he needed to attend an appointment- by internal medicine- in which he does not recall setting up that appointment. Pt is continuously saying "if I am discharged right now.. I know I am going to do drugs and drink myself away. I cannot go back to my ex- girlfriends house. I can't stand it". Will continue to monitor pt.

## 2020-10-24 NOTE — ED Provider Notes (Signed)
Patient will be discharged to [facility] tomorrow.  I was asked to prescribe all home medications, and this has been done.  Ms. Ricci Barker, case management, will pick the prescriptions up, and the patient will have 1 month supply with him at [facility].   Arnaldo Natal, MD 10/24/20 [phone]

## 2020-10-24 NOTE — ED Notes (Signed)
Pt walked to the TCU area to take a shower. Pt remained calm and cooperative throughout entire time. Pt back to his bed, will continue to monitor pt.

## 2020-10-24 NOTE — ED Notes (Signed)
Pt ambulatory to bathroom multiple times today without issue. Able to make needs known without issue.

## 2020-10-24 NOTE — ED Provider Notes (Signed)
And actually will now be going to [facility] as originally planned.  He did not have a primary care appointment.  He will board in the ED until tomorrow morning at which time safe transport will be arranged.   Arnaldo Natal, MD 10/24/20 1725

## 2020-10-24 NOTE — ED Provider Notes (Signed)
Disposition has not been changed.  The patient has a primary care appointment tomorrow, he cannot go to [facility] until after that.  Medications are still being picked up at the pharmacy, but he will be discharged home.   Arnaldo Natal, MD 10/24/20 [phone]

## 2020-10-25 ENCOUNTER — Other Ambulatory Visit (HOSPITAL_COMMUNITY): Payer: Self-pay

## 2020-10-25 NOTE — ED Provider Notes (Addendum)
Patient will be discharged to follow-up with [facility] this morning.   Fredia Sorrow, MD 10/25/20 [phone]  Contacted by Engineer, maintenance and they did not have things arranged at [facility].  Tonette Bihari was going to work on that.  He did get clarification and patient was good to be discharged to [facility].  He put in lots of information for the patient to follow-up and also has follow-up with the internal medicine clinic over [facility] but patient left prior to receiving the discharge instructions.  Just completed his discharge a few minutes ago.   Fredia Sorrow, MD 10/25/20 1028

## 2020-10-25 NOTE — ED Notes (Signed)
Social Work went to pick up medications. Verified rx for amlodipine, hydroxyzine, risperidone, and trazodone placed with pt belongings in preporation to do to [facility] in the morning.   Placed with pt belongings.

## 2020-10-25 NOTE — ED Notes (Signed)
[facility] has been notified for transportation services to [facility].  Discharge paperwork has been faxed to [facility]

## 2020-10-25 NOTE — ED Notes (Signed)
[place] counselor has the transport of the patient on HOLD at this time

## 2020-10-25 NOTE — ED Notes (Signed)
Patient's personal belongings with personal medications have been given to the patient for discharge

## 2020-10-25 NOTE — BH Assessment (Signed)
[place] Assessment Progress Note   Plan to send this voluntary patient to [facility] this morning has fallen through, per June in their intake office at 09:09.  This is due to a conflict with a scheduled internal medicine appointment this morning.  After extensive communication with pt's internal medicine nurse and with June, conflict could not be resolved.  Pt is now saying that he wants to be discharged.  Pt was psychiatrically cleared by Pecolia Ades, NP yesterday, 10/24/2020.  Pt's request has been staffed with EDP Fredia Sorrow, MD, who agrees to discharge pt.  Discharge instructions include referral information for the [facility] facility in question, as well as other resources.  Pt nurse, Caren Griffins, and charge nurse Denton Ar have been notified, along with Pecolia Ades, NP.  Jalene Mullet, [place] Coordinator [phone]

## 2020-10-25 NOTE — Progress Notes (Signed)
TOC CM received confirmation for [facility] CM with PCP's office. Follow up CM call will be rescheduled at a later date so pt can complete treatment at [facility] Substance abuse clinic. Pt will dc with 30 day supply of meds. Updated AVS was refaxed to [facility] and they will accept pt. ED RN has contacted [facility]. [place], [place] ED TOC CM [phone]

## 2020-10-27 ENCOUNTER — Telehealth: Payer: Medicaid Other

## 2020-11-01 ENCOUNTER — Ambulatory Visit: Payer: Self-pay | Admitting: Licensed Clinical Social Worker

## 2020-11-01 ENCOUNTER — Telehealth: Payer: Medicaid Other

## 2020-11-01 ENCOUNTER — Encounter: Payer: Self-pay | Admitting: Licensed Clinical Social Worker

## 2020-11-01 ENCOUNTER — Telehealth: Payer: Self-pay

## 2020-11-01 NOTE — Chronic Care Management (AMB) (Signed)
  Care Management   Social Work Visit Note  11/01/2020 Name: Ethan [name] MRN: [id number] DOB: 08/03/1965  Ethan [name] is a 55 y.o. year old male who sees Riesa Pope, MD for primary care. The care management team was consulted for assistance with care management and care coordination needs related to [place] contacted [facility] [phone]). Patient was scheduled to arrive on 10/25/2020. Appointment was canceled and patient never arrived. [facility] reported no future appointments are scheduled. [facility] advised for SW to call back on tomorrow and speak with June to get any further details.   Milus Height, [place]  Social Worker [facility] Care Management  [phone]

## 2020-11-01 NOTE — Telephone Encounter (Signed)
  Care Management   Outreach Note  11/01/2020 Name: Ethan [name] MRN: [id number] DOB: 09/09/1965  Referred by: Riesa Pope, MD Reason for referral : No chief complaint on file.   Third unsuccessful telephone outreach was attempted today. The patient was referred to the case management team for assistance with care management and care coordination. The patient's primary care provider has been notified of our unsuccessful attempts to make or maintain contact with the patient. The care management team is pleased to engage with this patient at any time in the future should he/she be interested in assistance from the care management team.   Follow Up Plan: The patient has been provided with contact information for the care management team and has been advised to call with any health related questions or concerns.   Johnney Killian, RN, BSN, CCM Care Management Coordinator [facility] Internal Medicine Phone: [phone] / Fax: [phone]

## 2020-11-02 ENCOUNTER — Other Ambulatory Visit: Payer: Self-pay

## 2020-11-02 ENCOUNTER — Ambulatory Visit: Payer: Medicaid Other | Admitting: Licensed Clinical Social Worker

## 2020-11-02 ENCOUNTER — Encounter (HOSPITAL_COMMUNITY): Payer: Self-pay | Admitting: *Deleted

## 2020-11-02 ENCOUNTER — Emergency Department (HOSPITAL_COMMUNITY)
Admission: EM | Admit: 2020-11-02 | Discharge: 2020-11-02 | Disposition: A | Discharge status: Other Acute Inpatient Hospital | Payer: Medicaid Other | Attending: Emergency Medicine | Admitting: Emergency Medicine

## 2020-11-02 ENCOUNTER — Telehealth: Payer: Self-pay | Admitting: *Deleted

## 2020-11-02 ENCOUNTER — Telehealth: Payer: Self-pay | Admitting: Behavioral Health

## 2020-11-02 ENCOUNTER — Encounter (HOSPITAL_COMMUNITY): Payer: Self-pay | Admitting: Emergency Medicine

## 2020-11-02 ENCOUNTER — Ambulatory Visit (HOSPITAL_COMMUNITY)
Admission: EM | Admit: 2020-11-02 | Discharge: 2020-11-03 | Disposition: A | Discharge status: Home / Self Care | Payer: Medicaid Other | Source: Home / Self Care

## 2020-11-02 DIAGNOSIS — Z79899 Other long term (current) drug therapy: Secondary | ICD-10-CM | POA: Diagnosis not present

## 2020-11-02 DIAGNOSIS — F1099 Alcohol use, unspecified with unspecified alcohol-induced disorder: Secondary | ICD-10-CM | POA: Insufficient documentation

## 2020-11-02 DIAGNOSIS — R45851 Suicidal ideations: Secondary | ICD-10-CM | POA: Diagnosis not present

## 2020-11-02 DIAGNOSIS — F251 Schizoaffective disorder, depressive type: Secondary | ICD-10-CM

## 2020-11-02 DIAGNOSIS — Z20822 Contact with and (suspected) exposure to covid-19: Secondary | ICD-10-CM | POA: Diagnosis not present

## 2020-11-02 DIAGNOSIS — Y901 Blood alcohol level of 20-39 mg/100 ml: Secondary | ICD-10-CM | POA: Diagnosis not present

## 2020-11-02 DIAGNOSIS — F259 Schizoaffective disorder, unspecified: Secondary | ICD-10-CM | POA: Diagnosis not present

## 2020-11-02 DIAGNOSIS — F1490 Cocaine use, unspecified, uncomplicated: Secondary | ICD-10-CM | POA: Diagnosis not present

## 2020-11-02 DIAGNOSIS — I10 Essential (primary) hypertension: Secondary | ICD-10-CM | POA: Diagnosis not present

## 2020-11-02 DIAGNOSIS — F17210 Nicotine dependence, cigarettes, uncomplicated: Secondary | ICD-10-CM | POA: Insufficient documentation

## 2020-11-02 DIAGNOSIS — N179 Acute kidney failure, unspecified: Secondary | ICD-10-CM

## 2020-11-02 DIAGNOSIS — Z789 Other specified health status: Secondary | ICD-10-CM

## 2020-11-02 DIAGNOSIS — F1090 Alcohol use, unspecified, uncomplicated: Secondary | ICD-10-CM

## 2020-11-02 DIAGNOSIS — Z7289 Other problems related to lifestyle: Secondary | ICD-10-CM

## 2020-11-02 DIAGNOSIS — F32A Depression, unspecified: Secondary | ICD-10-CM

## 2020-11-02 LAB — CBC WITH DIFFERENTIAL/PLATELET
Abs Immature Granulocytes: 0.02 10*3/uL (ref 0.00–0.07)
Basophils Absolute: 0 10*3/uL (ref 0.0–0.1)
Basophils Relative: 0 %
Eosinophils Absolute: 0.1 10*3/uL (ref 0.0–0.5)
Eosinophils Relative: 2 %
HCT: 45.2 % (ref 39.0–52.0)
Hemoglobin: 14.9 g/dL (ref 13.0–17.0)
Immature Granulocytes: 0 %
Lymphocytes Relative: 29 %
Lymphs Abs: 1.8 10*3/uL (ref 0.7–4.0)
MCH: 28.4 pg (ref 26.0–34.0)
MCHC: 33 g/dL (ref 30.0–36.0)
MCV: 86.1 fL (ref 80.0–100.0)
Monocytes Absolute: 0.5 10*3/uL (ref 0.1–1.0)
Monocytes Relative: 8 %
Neutro Abs: 3.9 10*3/uL (ref 1.7–7.7)
Neutrophils Relative %: 61 %
Platelets: 281 10*3/uL (ref 150–400)
RBC: 5.25 MIL/uL (ref 4.22–5.81)
RDW: 13.3 % (ref 11.5–15.5)
WBC: 6.5 10*3/uL (ref 4.0–10.5)
nRBC: 0 % (ref 0.0–0.2)

## 2020-11-02 LAB — COMPREHENSIVE METABOLIC PANEL
ALT: 44 U/L (ref 0–44)
AST: 51 U/L — ABNORMAL HIGH (ref 15–41)
Albumin: 4.3 g/dL (ref 3.5–5.0)
Alkaline Phosphatase: 85 U/L (ref 38–126)
Anion gap: 9 (ref 5–15)
BUN: 17 mg/dL (ref 6–20)
CO2: 21 mmol/L — ABNORMAL LOW (ref 22–32)
Calcium: 9 mg/dL (ref 8.9–10.3)
Chloride: 108 mmol/L (ref 98–111)
Creatinine, Ser: 1.45 mg/dL — ABNORMAL HIGH (ref 0.61–1.24)
GFR, Estimated: 56 mL/min — ABNORMAL LOW (ref >60)
Glucose, Bld: 80 mg/dL (ref 70–99)
Potassium: 3.8 mmol/L (ref 3.5–5.1)
Sodium: 138 mmol/L (ref 135–145)
Total Bilirubin: 0.5 mg/dL (ref 0.3–1.2)
Total Protein: 7.4 g/dL (ref 6.5–8.1)

## 2020-11-02 LAB — RAPID URINE DRUG SCREEN, HOSP PERFORMED
Amphetamines: NOT DETECTED
Barbiturates: NOT DETECTED
Benzodiazepines: NOT DETECTED
Cocaine: POSITIVE — AB
Opiates: NOT DETECTED
Tetrahydrocannabinol: NOT DETECTED

## 2020-11-02 LAB — RESP PANEL BY RT-PCR (FLU A&B, COVID) ARPGX2
Influenza A by PCR: NEGATIVE
Influenza B by PCR: NEGATIVE
SARS Coronavirus 2 by RT PCR: NEGATIVE

## 2020-11-02 LAB — SALICYLATE LEVEL: Salicylate Lvl: <7 mg/dL — ABNORMAL LOW (ref 7.0–30.0)

## 2020-11-02 LAB — ACETAMINOPHEN LEVEL: Acetaminophen (Tylenol), Serum: <10 ug/mL — ABNORMAL LOW (ref 10–30)

## 2020-11-02 LAB — ETHANOL: Alcohol, Ethyl (B): 37 mg/dL — ABNORMAL HIGH (ref <10)

## 2020-11-02 MED ORDER — LORAZEPAM 1 MG PO TABS: 0.0000 mg | ORAL_TABLET | Freq: Two times a day (BID) | ORAL | Status: DC

## 2020-11-02 MED ORDER — LORAZEPAM 2 MG/ML IJ SOLN: 0.0000 mg | Freq: Two times a day (BID) | INTRAMUSCULAR | Status: DC

## 2020-11-02 MED ORDER — IRBESARTAN 150 MG PO TABS
150.0000 mg | ORAL_TABLET | Freq: Every day | ORAL | Status: DC
  Filled 2020-11-02: qty 1

## 2020-11-02 MED ORDER — HYDROXYZINE HCL 25 MG PO TABS: 25.0000 mg | ORAL_TABLET | Freq: Three times a day (TID) | ORAL | Status: DC | PRN

## 2020-11-02 MED ORDER — ACETAMINOPHEN 325 MG PO TABS: 650.0000 mg | ORAL_TABLET | Freq: Four times a day (QID) | ORAL | Status: DC | PRN

## 2020-11-02 MED ORDER — NICOTINE 21 MG/24HR TD PT24
21.0000 mg | MEDICATED_PATCH | Freq: Every day | TRANSDERMAL | Status: DC
  Filled 2020-11-02: qty 1

## 2020-11-02 MED ORDER — RISPERIDONE 0.5 MG PO TABS
0.5000 mg | ORAL_TABLET | Freq: Two times a day (BID) | ORAL | Status: DC
  Filled 2020-11-02: qty 1

## 2020-11-02 MED ORDER — HYDROXYZINE HCL 25 MG PO TABS: 25.0000 mg | ORAL_TABLET | Freq: Four times a day (QID) | ORAL | Status: DC | PRN

## 2020-11-02 MED ORDER — THIAMINE HCL 100 MG PO TABS
100.0000 mg | ORAL_TABLET | Freq: Every day | ORAL | Status: DC
  Administered 2020-11-02: 100 mg via ORAL
  Filled 2020-11-02: qty 1

## 2020-11-02 MED ORDER — ALUM & MAG HYDROXIDE-SIMETH 200-200-20 MG/5ML PO SUSP
30.0000 mL | ORAL | Status: DC | PRN
Start: 2020-11-02 — End: 2020-11-03

## 2020-11-02 MED ORDER — LORAZEPAM 1 MG PO TABS: 0.0000 mg | ORAL_TABLET | Freq: Four times a day (QID) | ORAL | Status: DC

## 2020-11-02 MED ORDER — TRAZODONE HCL 50 MG PO TABS: 50.0000 mg | ORAL_TABLET | Freq: Every evening | ORAL | Status: DC | PRN

## 2020-11-02 MED ORDER — HYDROCHLOROTHIAZIDE 12.5 MG PO CAPS
12.5000 mg | ORAL_CAPSULE | Freq: Every day | ORAL | Status: DC
  Filled 2020-11-02: qty 1

## 2020-11-02 MED ORDER — THIAMINE HCL 100 MG/ML IJ SOLN: 100.0000 mg | Freq: Every day | INTRAMUSCULAR | Status: DC

## 2020-11-02 MED ORDER — LORAZEPAM 2 MG/ML IJ SOLN: 0.0000 mg | Freq: Four times a day (QID) | INTRAMUSCULAR | Status: DC

## 2020-11-02 MED ORDER — VALSARTAN-HYDROCHLOROTHIAZIDE 160-12.5 MG PO TABS: 1.0000 | ORAL_TABLET | Freq: Every day | ORAL | Status: DC

## 2020-11-02 MED ORDER — MAGNESIUM HYDROXIDE 400 MG/5ML PO SUSP: 30.0000 mL | Freq: Every day | ORAL | Status: DC | PRN

## 2020-11-02 MED ORDER — AMLODIPINE BESYLATE 5 MG PO TABS
5.0000 mg | ORAL_TABLET | Freq: Every day | ORAL | Status: DC
  Filled 2020-11-02: qty 1

## 2020-11-02 NOTE — ED Provider Notes (Addendum)
[facility] DEPT Provider Note   CSN: [id number] Arrival date & time: 11/02/20  1656    History Chief Complaint  Patient presents with   Psychiatric Evaluation   Drug Problem    Ethan [name] is a 55 y.o. male with medical history significant for alcoholism, anxiety, depression, substance abuse who presents for evaluation of suicidal thoughts.  Patient states just prior to arrival he attempted to swallow his entire bottle of pills in attempt to end his life.  Did not actually take them as he was stopped by roommate. He denies any SI, HI, AVH.  Patient very tearful in the room.  States he is not sleeping.  States that life "just is not worth living for."  States he feels like he is a "secondhand citizen."  He feels like "I am not worth the work."  He admits to prior inpatient hospitalizations for depression, suicide ideation, substance use.  States he does drink alcohol daily.  He denies any prior DTs or withdrawal seizures.  He did drink earlier today.  In note there is conversation with primary care provider and the person that patient lives with.  Patient had admitted to suicide attempt.  He denies any HI, AVH.  Does have prior history of schizoaffective disorder.  He denies being followed by psychiatry currently.  History obtained from patient and past medical records.  No interpreter used.  HPI     Past Medical History:  Diagnosis Date   Alcoholism ([place])    Anxiety    Depression    GERD (gastroesophageal reflux disease)    Heart murmur    Hepatitis C    Treated in prison for 18 months.  Sounds like Ribaviron and Interferon  2009-2011   Hypertension    Peptic ulcer    Substance abuse ([place])    Urge urinary incontinence    07/20/2015:  evaluated by Dr. Kathie Rhodes, [facility] planned    Patient Active Problem List   Diagnosis Date Noted   Major depressive disorder, recurrent, severe with psychotic features ([place])    Healthcare maintenance  02/14/2020   Weight loss, unintentional 11/10/2019   Alcohol use disorder, severe, dependence ([place]) 11/10/2019   History of prediabetes 11/10/2019   Cocaine use disorder, severe, dependence ([place]) 11/27/2016   GERD (gastroesophageal reflux disease) 12/22/2014   Tobacco use disorder 12/22/2014   Cannabis use disorder, severe, dependence ([place]) 12/22/2014   Alcohol use disorder, mild, abuse 12/22/2014   Schizoaffective disorder ([place]) 12/22/2014   History of hepatitis C virus infection 09/01/2013    Past Surgical History:  Procedure Laterality Date   APPENDECTOMY     INGUINAL HERNIA REPAIR Bilateral        Family History  Problem Relation Age of Onset   Breast cancer Sister    Breast cancer Maternal Grandmother    Colon polyps Brother    Diabetes Sister        x 2   Diabetes Maternal Aunt    Diabetes Paternal Aunt    Diabetes Mother        DM complications cause of death   Alcohol abuse Father    COPD Neg Hx     Social History   Tobacco Use   Smoking status: Every Day    Packs/day: 0.50    Years: 12.00    Pack years: 6.00    Types: Cigarettes    Start date: 07/14/1976   Smokeless tobacco: Never   Tobacco comments:    Stopped for  16 years.  Restarted with bout of depression/anxiety recently  Substance Use Topics   Alcohol use: Yes    Alcohol/week: 0.0 standard drinks    Comment: 3 beers nightly   Drug use: Yes    Types: "Crack" cocaine    Comment: Occassionally MJ currently--in past crack cocaine--more than 16 years ago.    Home Medications Prior to Admission medications   Medication Sig Start Date End Date Taking? Authorizing Provider  amLODipine (NORVASC) 5 MG tablet Take 1 tablet (5 mg total) by mouth daily. 10/24/20   Arnaldo Natal, MD  hydrOXYzine (ATARAX/VISTARIL) 25 MG tablet Take 1 tablet (25 mg total) by mouth every 6 hours as needed for anxiety. 10/24/20   Arnaldo Natal, MD  nicotine (NICODERM CQ - DOSED IN MG/24 HOURS) 21 mg/24hr patch Place 1 patch  (21 mg total) onto the skin daily. 10/24/20   Arnaldo Natal, MD  pantoprazole (PROTONIX) 40 MG tablet Take 1 tablet (40 mg total) by mouth daily. 10/24/20 11/23/20  Arnaldo Natal, MD  risperiDONE (RISPERDAL) 0.5 MG tablet Take 1 tablet (0.5 mg total) by mouth 2 (two) times daily. 10/24/20   Arnaldo Natal, MD  traZODone (DESYREL) 50 MG tablet Take 1 tablet (50 mg total) by mouth at bedtime and may repeat dose one time if needed. 10/24/20   Arnaldo Natal, MD  valsartan-hydrochlorothiazide (DIOVAN-HCT) 160-12.5 MG tablet Take 1 tablet by mouth daily. 10/24/20 11/23/20  Arnaldo Natal, MD    Allergies    Patient has no known allergies.  Review of Systems   Review of Systems  Constitutional: Negative.   HENT: Negative.    Respiratory: Negative.    Cardiovascular: Negative.   Gastrointestinal: Negative.   Genitourinary: Negative.   Musculoskeletal: Negative.   Skin: Negative.   Neurological: Negative.   All other systems reviewed and are negative.  Physical Exam Updated Vital Signs BP (!) 162/105   Pulse 98   Temp (!) 97.5 F (36.4 C)   Resp 18   SpO2 95%   Physical Exam Vitals and nursing note reviewed.  Constitutional:      General: He is not in acute distress.    Appearance: Normal appearance. He is well-developed. He is not ill-appearing or diaphoretic.  HENT:     Head: Atraumatic.     Nose: Nose normal.     Mouth/Throat:     Mouth: Mucous membranes are moist.  Eyes:     Pupils: Pupils are equal, round, and reactive to light.  Cardiovascular:     Rate and Rhythm: Normal rate and regular rhythm.     Pulses: Normal pulses.     Heart sounds: Normal heart sounds.  Pulmonary:     Effort: Pulmonary effort is normal. No respiratory distress.     Breath sounds: Normal breath sounds.  Abdominal:     General: Bowel sounds are normal. There is no distension.     Palpations: Abdomen is soft.  Musculoskeletal:        General: Normal range of motion.     Cervical back: Normal  range of motion and neck supple.  Skin:    General: Skin is warm and dry.  Neurological:     General: No focal deficit present.     Mental Status: He is alert and oriented to person, place, and time.  Psychiatric:        Mood and Affect: Mood is depressed. Affect is tearful. Affect is not flat.  Behavior: Behavior is withdrawn.        Thought Content: Thought content is not paranoid or delusional. Thought content includes suicidal ideation. Thought content does not include homicidal ideation. Thought content includes suicidal plan. Thought content does not include homicidal plan.     Comments: Tearful in room.  Admits to suicidal thoughts with plans to overdose on medication.  He denies any HI, AVH. Does not appear to be responding to internal stimuli.      ED Results / Procedures / Treatments   Labs (all labs ordered are listed, but only abnormal results are displayed) Labs Reviewed  COMPREHENSIVE METABOLIC PANEL - Abnormal; Notable for the following components:      Result Value   CO2 21 (*)    Creatinine, Ser 1.45 (*)    AST 51 (*)    GFR, Estimated 56 (*)    All other components within normal limits  ETHANOL - Abnormal; Notable for the following components:   Alcohol, Ethyl (B) 37 (*)    All other components within normal limits  RAPID URINE DRUG SCREEN, HOSP PERFORMED - Abnormal; Notable for the following components:   Cocaine POSITIVE (*)    All other components within normal limits  SALICYLATE LEVEL - Abnormal; Notable for the following components:   Salicylate Lvl <2.2 (*)    All other components within normal limits  ACETAMINOPHEN LEVEL - Abnormal; Notable for the following components:   Acetaminophen (Tylenol), Serum <10 (*)    All other components within normal limits  RESP PANEL BY RT-PCR (FLU A&B, COVID) ARPGX2  CBC WITH DIFFERENTIAL/PLATELET    EKG None  Radiology No results found.  Procedures Procedures   Medications Ordered in ED Medications   LORazepam (ATIVAN) injection 0-4 mg (has no administration in time range)    Or  LORazepam (ATIVAN) tablet 0-4 mg (has no administration in time range)  LORazepam (ATIVAN) injection 0-4 mg (has no administration in time range)    Or  LORazepam (ATIVAN) tablet 0-4 mg (has no administration in time range)  thiamine tablet 100 mg (has no administration in time range)    Or  thiamine (B-1) injection 100 mg (has no administration in time range)  nicotine (NICODERM CQ - dosed in mg/24 hours) patch 21 mg (has no administration in time range)  amLODipine (NORVASC) tablet 5 mg (has no administration in time range)  hydrOXYzine (ATARAX/VISTARIL) tablet 25 mg (has no administration in time range)  risperiDONE (RISPERDAL) tablet 0.5 mg (has no administration in time range)  irbesartan (AVAPRO) tablet 150 mg (has no administration in time range)    And  hydrochlorothiazide (MICROZIDE) capsule 12.5 mg (has no administration in time range)    ED Course  I have reviewed the triage vital signs and the nursing notes.  Pertinent labs & imaging results that were available during my care of the patient were reviewed by me and considered in my medical decision making (see chart for details).  Here for evaluation of suicidal thoughts with plan to overdose.  Had attempted to overdose on her medication just prior to arrival however was stopped by person that patient lives with.  He is very tearful in room.  Appears depressed.  Admits to not sleeping and drinking alcohol and doing illicit substances to cope with his depression.  States he feels like he has nothing to live for and that he feels like he is "secondhand."  We will plan on labs, imaging, psychiatric consult.   Labs personally reviewed and  interpreted:  UDS positive for cocaine which is expected as patient admits to this, ethanol 37 Metabolic panel does show creatinine 1.45 CBC without leukocytosis COVID, flu negative  Patient medically  cleared.  Disposition per psychiatry, psych hold orders/ home meds placed  BP elevated. Will give home meds. He did not take these today.  Low suspicion for hypertensive urgency or emergency.  Patient home Psych provider Kerin Salen who would like patient to be admitted to [facility] for inpatient treatment if possible.     MDM Rules/Calculators/A&P                            Final Clinical Impression(s) / ED Diagnoses Final diagnoses:  Suicidal ideation  Depression, unspecified depression type  Cocaine use  Alcohol use  AKI (acute kidney injury) ([place])    Rx / DC Orders ED Discharge Orders     None           Fayez Sturgell A, PA-C 11/02/20 2024    Nyonna Hargrove A, PA-C 11/02/20 2059    Drenda Freeze, MD 11/03/20 [phone]

## 2020-11-02 NOTE — BH Assessment (Addendum)
Comprehensive Clinical Assessment (CCA) Note  11/02/2020 Ethan [name] [id number]  Chief Complaint:  Chief Complaint  Patient presents with   Psychiatric Evaluation   Drug Problem   Visit Diagnosis:   Schizoaffective disorder Suicidal ideation  Disposition: Per Lindon Romp, NP pt recommended 24 hr observation for continuous assessment, safety, and psychiatric stabilization. Pt can transfer from [facility] to [facility].  Per Leandro Reasoner, NP consulting with Lindon Romp, NP and [facility] to see if pt would benefit from inpatient tx.  [facility] from 11/02/2020 in [place] DEPT ED from 10/22/2020 in [place] DEPT ED from 08/06/2020 in [place] No Risk No Risk Error: Q7 should not be populated when Q6 is No       The patient demonstrates the following risk factors for suicide: Chronic risk factors for suicide include: psychiatric disorder of schizoaffective disorder, substance use disorder, and previous suicide attempts multiple suicide attempts and inpatient hospitalizations . Acute risk factors for suicide include: family or marital conflict, social withdrawal/isolation, and loss (financial, interpersonal, professional). Protective factors for this patient include: hope for the future. Considering these factors, the overall suicide risk at this point appears to be low. Patient is not appropriate for outpatient follow up.  Ethan [name] is a 55 year old male transported to [facility] for suicidal ideations with a plan to overdose on his medications. Patient states that prior to arrival, patient had all of his medications lined up on the counter to overdose. Patient reports that he got a phone call from a physician, and was discussing his current medical conditions to the physician, and it was the doctor that "Saved my life". Patient reports that his ex-girlfriend/roommate knew of his plan to  overdose on medication and did not do anything about it. "She doesn't care, she tries to poison my food anyway". Patient reports he currently does not have a psychiatrist, but is taking psychiatric medications. Patient reports that he has a history of multiple suicide attempts, hospitalizations, and inpatient drug rehabilitation admissions. 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 Patient reports suicidal ideation with plan of overdosing, homicidal ideations at times--generalized homicidal ideations, not directed at anyone specifically. Patient stated that "sometimes I want other people to feel the pain that I can feel.  I'm not saying its okay, but I can understand why people take guns and hurt a lot of people because they just want them to hurt like they are hurting". Patient reports that he is currently experiencing both visual and auditory hallucinations. Patient reports that he hears voices that tell him to hurt himself and hurt other people. Patient reports that he drinks alcohol, smokes marijuana, and smokes crack daily. Patient states that he has been using these substances for years, and spends every single penny that he has on drugs and alcohol. Patient states that he does binge drink every day from the minute he wakes up until the minute he goes to bed at night. "I drink till I pass out". Patient states that he feels better after being in the ED, and doesn't feel that he is currently a danger to himself or others  CCA Screening, Triage and Referral (STR)  Patient Reported Information How did you hear about [place]? Legal System  What Is the Reason for Your Visit/Call Today? Ethan [name] is a 55 year old male transported to [facility] for suicidal ideations  with a plan to overdose on his medications. Patient states that prior to arrival, patient had all of his medications lined up on the counter to overdose. Patient reports that he got a phone call from  a physician, and was discussing his current medical conditions to the physician, and it was the doctor that "Saved my life". Patient reports that his ex-girlfriend/roommate knew of his plan to overdose on medication and did not do anything about it. "She doesn't care, she tries to poison my food anyway". Patient reports he currently does not have a psychiatrist, but is taking psychiatric medications. Patient reports that he has a history of multiple suicide attempts, hospitalizations, and inpatient drug rehabilitation admissions. 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 Patient reports suicidal ideation with plan of overdosing, homicidal ideations at times--generalized homicidal ideations, not directed at anyone specifically. Patient stated that "sometimes I want other people to feel the pain that I can feel.  I'm not saying its okay, but I can understand why people take guns and hurt a lot of people because they just want them to hurt like they are hurting". Patient reports that he is currently experiencing both visual and auditory hallucinations. Patient reports that he hears voices that tell him to hurt himself and hurt other people. Patient reports that he drinks alcohol, smokes marijuana, and smokes crack daily. Patient states that he has been using these substances for years, and spends every single penny that he has on drugs and alcohol. Patient states that he does binge drink every day from the minute he wakes up until the minute he goes to bed at night. "I drink till I pass out". Patient states that he feels better after being in the ED, and doesn't feel that he is currently a danger to himself or others.  How Long Has This Been Causing You Problems? 1-6 months  What Do You Feel Would Help You the Most Today? Treatment for Depression or other mood problem; Alcohol or Drug Use Treatment   Have You Recently Had Any Thoughts About Hurting  Yourself? Yes  Are You Planning to Commit Suicide/Harm Yourself At This time? Yes   Have you Recently Had Thoughts About Hurting Someone [place] Dawn? Yes  Are You Planning to Harm Someone at This Time? No  Explanation: No data recorded  Have You Used Any Alcohol or Drugs in the Past 24 Hours? Yes  How Long Ago Did You Use Drugs or Alcohol? No data recorded What Did You Use and How Much? binge drinking, smoking marijuana, smoking crack cocaine   Do You Currently Have a Therapist/Psychiatrist? No  Name of Therapist/Psychiatrist: No data recorded  Have You Been Recently Discharged From Any Office Practice or Programs? No  Explanation of Discharge From Practice/Program: No data recorded    CCA Screening Triage Referral Assessment Type of Contact: Tele-Assessment  Telemedicine Service Delivery: Telemedicine service delivery: This service was provided via telemedicine using a 2-way, interactive audio and video technology  Is this Initial or Reassessment? Initial Assessment  Date Telepsych consult ordered in [facility]:  11/02/20  Time Telepsych consult ordered in [facility]:  2143  Location of Assessment: [facility] ED  Provider Location: [facility] Assessment Services   Collateral Involvement: none available   Does Patient Have a [place]? No data recorded Name and Contact of Legal Guardian: No data recorded If Minor and Not Living with Parent(s), Who has Custody? No data  recorded Is CPS involved or ever been involved? Never  Is APS involved or ever been involved? Never   Patient Determined To Be At Risk for Harm To Self or Others Based on Review of Patient Reported Information or Presenting Complaint? Yes, for Self-Harm  Method: No data recorded Availability of Means: No data recorded Intent: No data recorded Notification Required: No data recorded Additional Information for Danger to Others Potential: No data recorded Additional Comments for Danger to Others Potential:  No data recorded Are There Guns or Other Weapons in Your Home? No data recorded Types of Guns/Weapons: No data recorded Are These Weapons Safely Secured?                            No data recorded Who Could Verify You Are Able To Have These Secured: No data recorded Do You Have any Outstanding Charges, Pending Court Dates, Parole/Probation? No data recorded Contacted To Inform of Risk of Harm To Self or Others: Unable to Contact:    Does Patient Present under Involuntary Commitment? No  IVC Papers Initial File Date: No data recorded  [place] of Residence: [place]   Patient Currently Receiving the Following Services: Not Receiving Services   Determination of Need: Emergent (2 hours)   Options For Referral: Inpatient Hospitalization; Outpatient Therapy; Medication Management     CCA Biopsychosocial Patient Reported Schizophrenia/Schizoaffective Diagnosis in Past: Yes   Strengths: none reported   Mental Health Symptoms Depression:   Hopelessness; Irritability; Sleep (too much or little)   Duration of Depressive symptoms:  Duration of Depressive Symptoms: Greater than two weeks   Mania:   None   Anxiety:    None   Psychosis:   Hallucinations (visual, auditory, paranoia)   Duration of Psychotic symptoms:  Duration of Psychotic Symptoms: Greater than six months   Trauma:   None   Obsessions:   None   Compulsions:   None   Inattention:   None   Hyperactivity/Impulsivity:   N/A   Oppositional/Defiant Behaviors:   None   Emotional Irregularity:   None   Other Mood/Personality Symptoms:   depressed mood    Mental Status Exam Appearance and self-care  Stature:   Average   Weight:   Average weight   Clothing:   Casual   Grooming:   Normal   Cosmetic use:   None   Posture/gait:   Normal   Motor activity:   Not Remarkable   Sensorium  Attention:   Normal   Concentration:   Normal   Orientation:   X5   Recall/memory:    Normal   Affect and Mood  Affect:   Appropriate   Mood:   Depressed; Negative   Relating  Eye contact:   None   Facial expression:   Responsive; Depressed   Attitude toward examiner:   Cooperative   Thought and Language  Speech flow:  Clear and Coherent   Thought content:   Appropriate to Mood and Circumstances   Preoccupation:   None   Hallucinations:   Auditory   Organization:  No data recorded  [employer] of Knowledge:   Average   Intelligence:   Average   Abstraction:   Functional   Judgement:   Poor   Reality Testing:   Variable   Insight:   Gaps   Decision Making:   Impulsive   Social Functioning  Social Maturity:   Impulsive   Social Judgement:  Heedless   Stress  Stressors:   Financial; Relationship; Family conflict   Coping Ability:   Exhausted   Skill Deficits:   Responsibility; Self-control   Supports:   Friends/Service system     Religion: Religion/Spirituality Are You A Religious Person?: No  Leisure/Recreation: Leisure / Recreation Do You Have Hobbies?: No  Exercise/Diet: Exercise/Diet Do You Exercise?: No Have You Gained or Lost A Significant Amount of Weight in the Past Six Months?: No Do You Follow a Special Diet?: No Do You Have Any Trouble Sleeping?: Yes Explanation of Sleeping Difficulties: 3 hours of sleep per night   CCA Employment/Education Employment/Work Situation: Employment / Work Situation Employment Situation: Unemployed Patient's Job has Been Impacted by Current Illness: No Has Patient ever Been in Passenger transport manager?: No  Education: Education Is Patient Currently Attending School?: No Last Grade Completed:  (UTA) Did You Attend College?: No Did You Have An Individualized Education Program (IIEP): No Did You Have Any Difficulty At School?: No Patient's Education Has Been Impacted by Current Illness: No   CCA Family/Childhood History Family and Relationship  History: Family history Marital status: Single Does patient have children?: Yes How many children?: 3 How is patient's relationship with their children?: distant relationship   Childhood History:  Childhood History By whom was/is the patient raised?: Foster parents, Grandparents Did patient suffer any verbal/emotional/physical/sexual abuse as a child?: Yes Did patient suffer from severe childhood neglect?: No Has patient ever been sexually abused/assaulted/raped as an adolescent or adult?: No Was the patient ever a victim of a crime or a disaster?: No Witnessed domestic violence?: No Has patient been affected by domestic violence as an adult?: No  Child/Adolescent Assessment:     CCA Substance Use Alcohol/Drug Use: Alcohol / Drug Use Pain Medications: Please see MAR Prescriptions: Please see MAR Over the Counter: Please see MAR History of alcohol / drug use?: Yes Negative Consequences of Use: Financial, Personal relationships Withdrawal Symptoms: Irritability Substance #1 Name of Substance 1: alcohol 1 - Age of First Use: UTA 1 - Amount (size/oz): 2 forties 1 - Frequency: daily 1 - Duration: "months" 1 - Last Use / Amount: 2 forties today 1 - Method of Aquiring: store Substance #2 Name of Substance 2: Cocaine 2 - Age of First Use: 80 or 55 yr old 2 - Amount (size/oz): Varies 2 - Frequency: Weekly 2 - Duration: Ongoing 2 - Last Use / Amount: 2 days ago 2 - Method of Aquiring: off the street 2 - Route of Substance Use: smoke   ASAM's:  Six Dimensions of Multidimensional Assessment  Dimension 1:  Acute Intoxication and/or Withdrawal Potential:   Dimension 1:  Description of individual's past and current experiences of substance use and withdrawal: Ongoing substance use, no current complaints of withdrawal complications  Dimension 2:  Biomedical Conditions and Complications:   Dimension 2:  Description of patient's biomedical conditions and  complications: Patient has  sveral medical conditions that are compromised by his drug and alcohol use  Dimension 3:  Emotional, Behavioral, or Cognitive Conditions and Complications:  Dimension 3:  Description of emotional, behavioral, or cognitive conditions and complications: Per history, Pt has schizoaffective disorder and is non-compliant with treatment  for this condition  Dimension 4:  Readiness to Change:  Dimension 4:  Description of Readiness to Change criteria: Patient states that he is ready to take the steps necessary to change his life  Dimension 5:  Relapse, Continued use, or Continued Problem Potential:  Dimension 5:  Relapse, continued use,  or continued problem potential critiera description: Patient has poor coping mechanisms and has been a chronic relapser  Dimension 6:  Recovery/Living Environment:  Dimension 6:  Recovery/Iiving environment criteria description: Lives with his payee, with whom he has conflict  ASAM Severity Score: ASAM's Severity Rating Score: 12  ASAM Recommended Level of Treatment: ASAM Recommended Level of Treatment: Level III Residential Treatment   Substance use Disorder (SUD) Substance Use Disorder (SUD)  Checklist Symptoms of Substance Use: Persistent desire or unsuccessful efforts to cut down or control use, Continued use despite having a persistent/recurrent physical/psychological problem caused/exacerbated by use, Continued use despite persistent or recurrent social, interpersonal problems, caused or exacerbated by use, Evidence of withdrawal (Comment), Large amounts of time spent to obtain, use or recover from the substance(s), Recurrent use that results in a failure to fulfill major role obligations (work, school, home), Social, occupational, recreational activities given up or reduced due to use, Substance(s) often taken in larger amounts or over longer times than was intended  Recommendations for Services/Supports/Treatments: Recommendations for  Services/Supports/Treatments Recommendations For Services/Supports/Treatments: Other (Comment) (24 hr obs)  Discharge Disposition:  24 hr observation/continuous assessment [facility]  DSM5 Diagnoses: Patient Active Problem List   Diagnosis Date Noted   Major depressive disorder, recurrent, severe with psychotic features ([place])    Healthcare maintenance 02/14/2020   Weight loss, unintentional 11/10/2019   Alcohol use disorder, severe, dependence ([place]) 11/10/2019   History of prediabetes 11/10/2019   Cocaine use disorder, severe, dependence ([place]) 11/27/2016   GERD (gastroesophageal reflux disease) 12/22/2014   Tobacco use disorder 12/22/2014   Cannabis use disorder, severe, dependence ([place]) 12/22/2014   Alcohol use disorder, mild, abuse 12/22/2014   Schizoaffective disorder ([place]) 12/22/2014   History of hepatitis C virus infection 09/01/2013     Referrals to Alternative Service(s): Referred to Alternative Service(s):   Place:   Date:   Time:    Referred to Alternative Service(s):   Place:   Date:   Time:    Referred to Alternative Service(s):   Place:   Date:   Time:    Referred to Alternative Service(s):   Place:   Date:   Time:     Rachel Bo Breonna Gafford, LCSW

## 2020-11-02 NOTE — Patient Instructions (Signed)
Visit Information  Instructions: patient will work with SW to address concerns related to mental health  Patient was given the following information about care management and care coordination services today, agreed to services, and gave verbal consent: 1.care management/care coordination services include personalized support from designated clinical staff supervised by their physician, including individualized plan of care and coordination with other care providers 2. 24/7 contact phone numbers for assistance for urgent and routine care needs. 3. The patient may stop care management/care coordination services at any time by phone call to the office staff.  Patient verbalizes understanding of instructions provided today and agrees to view in [place].   The care management team will reach out to the patient again over the next 30 days.   Milus Height, [place]  Social Worker [facility] Care Management  [phone]

## 2020-11-02 NOTE — Chronic Care Management (AMB) (Signed)
  Care Management   Note  11/02/2020 Name: Ethan [name] MRN: [id number] DOB: 1965/04/27  Ethan [name] is a 55 y.o. year old male who is a primary care patient of Riesa Pope, MD and is actively engaged with the care management team. I reached out to Waldemar Dickens by phone today to assist with re-scheduling an initial visit with the BSW. Patient stated he was going to take pills to harm himself. I ask him not to take any pills let me connect him to our BSW. Sent BSW message and keep patient on the line line talking until BSW connected with him.    Follow up plan: Telephone appointment with care management team member scheduled for:11/02/2020  [place] Management  Direct Dial: [phone]

## 2020-11-02 NOTE — Chronic Care Management (AMB) (Signed)
  Care Management   Social Work Visit Note  11/02/2020 Name: Ethan [name] MRN: [id number] DOB: 1965-08-09  Ethan [name] is a 55 y.o. year old male who sees Riesa Pope, MD for primary care. The care management team was consulted for assistance with care management and care coordination needs related to [place] and Resources   Patient was given the following information about care management and care coordination services today, agreed to services, and gave verbal consent: 1.care management/care coordination services include personalized support from designated clinical staff supervised by their physician, including individualized plan of care and coordination with other care providers 2. 24/7 contact phone numbers for assistance for urgent and routine care needs. 3. The patient may stop care management/care coordination services at any time by phone call to the office staff.  Engaged with patient by telephone for follow up visit in response to provider referral for social work chronic care management and care coordination services.  Assessment: Review of patient history, allergies, and health status during evaluation of patient need for care management/care coordination services.    Interventions:  Patient interviewed and appropriate assessments performed Collaborated with clinical team regarding patient needs  Patient advised he was having thoughts of self harm- Overdose by prescription medications. SW deescalated situation and patient agreed to not take the pills. SW advised patient to contact - [place], [place] gave patient the number to 24/7 crises line.  Patient advised none of the resources assisted.  SW collaborated with Dr. Theodis Shove to assist patient. Dr. Theodis Shove contacted patient immediately.   SDOH (Social Determinants of Health) assessments performed: No     Plan:  patient will work with BSW to address  needs related to [place] Concerns  Social Worker will follow up with patient within 7 days. .  Patient will continue to speak with Dr. Theodis Shove.   Milus Height, [place]  Social Worker [facility] Care Management  [phone]

## 2020-11-02 NOTE — Telephone Encounter (Signed)
Spoke w/Yvette Laurance Flatten who is Pt's Payee & resides w/Pt. Initial call made @ ~3:00pm. Pt threatened to take all his medications today & has pill bottles on his dresser. Pt was agitated over the phone & initially yelling @ [place]. Pt is asking for help today.  Pt reports he was @ [facility] ED 2 wks ago & spent 3 days in the hallway. He felt defeated w/no help resulting from the visit. Did troubleshooting to determine if Pt's Bros could feasibly pick him up after work & get him to the [facility] ED. Bros did not get off work until 4:30pm. Determined Pt needed other source of transportation. Pt intended to ask his next door neighbor, but he had Company.   Called Yvette back @ 3:15pm to have her gather all his medications & put out of reach of Pt. She agreed & completed task w/Clinician on the phone.Dewaine Oats stated if the [employer] sent an Officer, Pt would get very angry & she did not feel comfortable w/this choice. She reports Pt has been esp'ly agitated today.  Requested to speak w/Pt again. He related he needs, "[facility] Care Facility."  Offered many options for care today; [place] ED. Pt did not agree about [facility], but Clinician insisted we fllw protocol so Pt has access to services @ [facility] on [address] to assist in his care.  [name] called [facility] @ 4:20pm requesting transportation for Pt to [place] ED stating he was, "ready to go." Instructed Dewaine Oats to call 911 & tell them situation invls Pt who is suicidal.   Consulted w/Dr. Joylene John who filled in Hx of Pt & most recent d/chg from [facility]. Collaborated as needed to protect Pt & get him to safety.   Clinician called Yvette @ 4:45pm to check on Pt. She related the EMS just picked him up & he is enroute.  Spent several min w/Yvette to debrief, as she was in tears. Thanked her for the assistance & care she gave to Pt. He is safe & she can get some much needed rest for herself. Yvette thanked Pension scheme manager.   Called Charge RN Clark @ [place] [facility] to inform  him of Pt arriving. Discussed situation & ended call. I appreciate Clark's professionalism & his respect for the situation.  Dr. Theodis Shove

## 2020-11-02 NOTE — ED Provider Notes (Addendum)
Behavioral Health Admission H&P [facility] & OBS)  Date: 11/03/20 Patient Name: Ethan [name] MRN: [id number] Chief Complaint:  Chief Complaint  Patient presents with   Suicidal       Diagnoses:  Final diagnoses:  Schizoaffective disorder, depressive type [facility])    HPI: Ethan [name] is a 55 yo male. Patient presented to [facility] for the evaluation of suicidal ideation and alcohol and illicit drug detox. Patient was transferred to [facility] for continuous assessment.   Patient's chart was reviewed and he was evaluated face to face upon arrival to [facility]. Patient is alert and oriented X4, he is calm and cooperative. Patient is speaking in a clear tone at moderate rate. He reports is mood as depressed and his affect is congruent with stated mood. He denies HI, AVH, paranoia, and no delusional thought content noted. There were no indications that patient is responding to any internal or external stimuli.   Patient continues to endorse suicidal ideation with plan to overdose on illicit drug or his prescription medications. He reports that he became frustrated today 11/02/2020  and wanted to overdose on his prescription medications because "I'm struggling with drugs and alcohol addiction and no on will help me." He reports that he went to [facility] on 10/22/20 requesting detox and long term substance abuse treatment program "but they turned me away and treated me like an alien."   Per chart review, patient was at [facility] from 10/22/20-10/25/20. It appeared he was recommended to discharge to [facility] in [facility] but due to schedule conflict with his scheduled internal medicine appointment patient was unable to be accepted on 10/25/20. Discharge follow up instructions were included in patient's AVS however pt left the ED prior to be given this information.   Per TTS Counselor Jalene Mullet 10/25/20: Plan to send this voluntary patient to [facility] in [facility] this morning has fallen through, per June in their intake  office at 09:09.  This is due to a conflict with a scheduled internal medicine appointment this morning.  After extensive communication with pt's internal medicine nurse and with June, conflict could not be resolved.  Pt is now saying that he wants to be discharged.  Pt was psychiatrically cleared by Pecolia Ades, NP yesterday, 10/24/2020.  Pt's request has been staffed with EDP Fredia Sorrow, MD, who a grees to discharge pt.  Discharge instructions include referral information for the [facility] facility in question, as well as other resources.  Pt nurse, Caren Griffins, and charge nurse Denton Ar have been notified, along with Pecolia Ades, NP.  Patient reports that he uses $50 worth of crack cocaine, smokes 4 blunts of marijuana, and drinks 12 cans of beers on a daily basis. He states "I drink beers from the time I wake up till I pass out." He denies history of alcohol withdraw seizures or DTs. He reports that he has loss ~60 pounds in the past 3 months due to poor appetite from drug and alcohol use. He states he is depressed due to inability to stop using drugs, financial strain from spending "all my money on drugs" and people mistreating him. He is endorsing depressive symptoms of hopelessness, worthlessness, irritability, poor appetite, and sleep disturbance.    PHQ 2-9:  Flowsheet Row ED from 10/22/2020 in [place] DEPT ED from 08/06/2020 in [facility] [place] [facility] Visit from 03/07/2020 in [place]  Thoughts that you would be better off dead, or of hurting yourself in some way More  than half the days Several days More than half the days  PHQ-9 Total Score 20 11 20        Flowsheet Row ED from 11/02/2020 in [facility] Most recent reading at 11/02/2020 11:54 PM ED from 11/02/2020 in [place] DEPT Most recent reading at 11/02/2020  5:12 PM ED from 10/22/2020 in [place] DEPT Most recent reading at 10/22/2020  2:19 PM  C-SSRS RISK CATEGORY High Risk No Risk No Risk        Total Time spent with patient: 20 minutes  Musculoskeletal  Strength & Muscle Tone: within normal limits Gait & Station: normal Patient leans: Right  Psychiatric Specialty Exam  Presentation General Appearance: Appropriate for Environment  Eye Contact:Good  Speech:Clear and Coherent  Speech Volume:Normal  Handedness:Right   Mood and Affect  Mood:Depressed; Irritable  Affect:Congruent   Thought Process  Thought Processes:Coherent; Goal Directed  Descriptions of Associations:Intact  Orientation:Full (Time, Place and Person)  Thought Content:WDL  Diagnosis of Schizophrenia or Schizoaffective disorder in past: No  Duration of Psychotic Symptoms: Greater than six months  Hallucinations:Hallucinations: None Ideas of Reference:None  Suicidal Thoughts:Suicidal Thoughts: Yes, Active SI Active Intent and/or Plan: With Plan Homicidal Thoughts:Homicidal Thoughts: No  Sensorium  Memory:Immediate Good; Recent Fair; Remote Fair  Judgment:Fair  Insight:Fair   Executive Functions  Concentration:Good  Attention Span:Good  [place]  Language:Good   Psychomotor Activity  Psychomotor Activity:Psychomotor Activity: Normal  Assets  Assets:Physical Health; Desire for Improvement; Communication Skills   Sleep  Sleep:Sleep: Fair Number of Hours of Sleep: 5  Nutritional Assessment (For OBS and FBC admissions only) Has the patient had a weight loss or gain of 10 pounds or more in the last 3 months?: Yes Has the patient had a decrease in food intake/or appetite?: Yes Does the patient have dental problems?: No Does the patient have eating habits or behaviors that may be indicators of an eating disorder including binging or inducing vomiting?: No Has the patient recently lost weight without trying?: Yes,  34 lbs. or greater Has the patient been eating poorly because of a decreased appetite?: No Malnutrition Screening Tool Score: 4 Nutritional Assessment Referrals: Refer to Social Work for [facility] and nursing note reviewed.  Constitutional:      General: He is not in acute distress.    Appearance: He is well-developed. He is not ill-appearing, toxic-appearing or diaphoretic.  HENT:     Head: Normocephalic and atraumatic.     Nose: Nose normal.  Eyes:     Conjunctiva/sclera: Conjunctivae normal.     Comments: Legally blind  Cardiovascular:     Rate and Rhythm: Normal rate.  Pulmonary:     Effort: Pulmonary effort is normal. No respiratory distress.     Breath sounds: Normal breath sounds.  Abdominal:     Palpations: Abdomen is soft.     Tenderness: There is no abdominal tenderness.  Musculoskeletal:        General: Normal range of motion.     Cervical back: Neck supple.  Skin:    General: Skin is warm and dry.  Neurological:     Mental Status: He is alert and oriented to person, place, and time.  Psychiatric:        Attention and Perception: Attention and perception normal. He is attentive. He does not perceive auditory or visual hallucinations.        Mood and Affect: Mood is  depressed.        Speech: Speech normal.        Behavior: Behavior is cooperative.        Thought Content: Thought content is not paranoid or delusional. Thought content includes suicidal ideation. Thought content does not include homicidal ideation. Thought content includes suicidal plan. Thought content does not include homicidal plan.   Review of Systems  Constitutional: Negative.   HENT: Negative.    Eyes: Negative.   Respiratory: Negative.    Cardiovascular: Negative.   Gastrointestinal:  Positive for heartburn.  Genitourinary:  Positive for frequency.  Musculoskeletal: Negative.   Skin: Negative.   Neurological: Negative.   Endo/Heme/Allergies: Negative.    Psychiatric/Behavioral:  Positive for depression, substance abuse and suicidal ideas. Negative for hallucinations. The patient is not nervous/anxious.    Blood pressure (!) 145/89, pulse 72, temperature 98 F (36.7 C), temperature source Oral, resp. rate 18, SpO2 100 %. There is no height or weight on file to calculate BMI.  Past Psychiatric History: schizoaffective disorder, polysubstance abuse, suicidal ideation, depression   Is the patient at risk to self? Yes  Has the patient been a risk to self in the past 6 months? Yes .    Has the patient been a risk to self within the distant past? No   Is the patient a risk to others? No   Has the patient been a risk to others in the past 6 months? No   Has the patient been a risk to others within the distant past? No   Past Medical History:  Past Medical History:  Diagnosis Date   Alcoholism ([place])    Anxiety    Depression    GERD (gastroesophageal reflux disease)    Heart murmur    Hepatitis C    Treated in prison for 18 months.  Sounds like Ribaviron and Interferon  2009-2011   Hypertension    Peptic ulcer    Substance abuse ([place])    Urge urinary incontinence    07/20/2015:  evaluated by Dr. Kathie Rhodes, Urodynamics planned    Past Surgical History:  Procedure Laterality Date   APPENDECTOMY     INGUINAL HERNIA REPAIR Bilateral     Family History:  Family History  Problem Relation Age of Onset   Breast cancer Sister    Breast cancer Maternal Grandmother    Colon polyps Brother    Diabetes Sister        x 2   Diabetes Maternal Aunt    Diabetes Paternal Aunt    Diabetes Mother        DM complications cause of death   Alcohol abuse Father    COPD Neg Hx     Social History:  Social History   Socioeconomic History   Marital status: Single    Spouse name: Not on file   Number of children: 3   Years of education: GED   Highest education level: Not on file  Occupational History   Occupation: Disability-vision     Comment: Len Blalock, 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 [employer]  Tobacco Use   Smoking status: Every Day    Packs/day: 0.50    Years: 12.00    Pack years: 6.00    Types: Cigarettes    Start date: 07/14/1976   Smokeless tobacco: Never   Tobacco comments:    Stopped for 16 years.  Restarted with bout of depression/anxiety recently  Substance and Sexual Activity   Alcohol use: Yes    Alcohol/week: 0.0  standard drinks    Comment: 3 beers nightly   Drug use: Yes    Types: "Crack" cocaine    Comment: Occassionally MJ currently--in past crack cocaine--more than 16 years ago.   Sexual activity: Yes    Partners: Female    Birth control/protection: None    Comment: WIFE USED MAJUANA SATURDAY  Other Topics Concern   Not on file  Social History Narrative   Lost job and schooling secondary to "past"   Apparently for what he was incarcerated for.   Social Determinants of Health   Financial Resource Strain: High Risk   Difficulty of Paying Living Expenses: Hard  Food Insecurity: Food Insecurity Present   Worried About Running Out of Food in the Last Year: Sometimes true   Ran Out of Food in the Last Year: Sometimes true  Transportation Needs: No Transportation Needs   Lack of Transportation (Medical): No   Lack of Transportation (Non-Medical): No  Physical Activity: Insufficiently Active   Days of Exercise per Week: 1 day   Minutes of Exercise per Session: 30 min  Stress: Not on file  Social Connections: Unknown   Frequency of Communication with Friends and Family: More than three times a week   Frequency of Social Gatherings with Friends and Family: More than three times a week   Attends Religious Services: Not on Electrical engineer or Organizations: Not on file   Attends Archivist Meetings: Not on file   Marital Status: Not on file  Intimate Partner Violence: Not on file    SDOH:  SDOH Screenings   Alcohol Screen: Not on file  Depression (PHQ2-9): Medium Risk   PHQ-2  Score: 20  Financial Resource Strain: High Risk   Difficulty of Paying Living Expenses: Hard  Food Insecurity: Landscape architect Present   Worried About Charity fundraiser in the Last Year: Sometimes true   Arboriculturist in the Last Year: Sometimes true  Housing: High Risk   Last Housing Risk Score: 2  Physical Activity: Insufficiently Active   Days of Exercise per Week: 1 day   Minutes of Exercise per Session: 30 min  Social Connections: Unknown   Frequency of Communication with Friends and Family: More than three times a week   Frequency of Social Gatherings with Friends and Family: More than three times a week   Attends Religious Services: Not on Electrical engineer or Organizations: Not on file   Attends Archivist Meetings: Not on file   Marital Status: Not on file  Stress: Not on file  Tobacco Use: High Risk   Smoking Tobacco Use: Every Day   Smokeless Tobacco Use: Never  Transportation Needs: No Transportation Needs   Lack of Transportation (Medical): No   Lack of Transportation (Non-Medical): No    Last Labs:  Admission on 11/02/2020, Discharged on 11/02/2020  Component Date Value Ref Range Status   SARS Coronavirus 2 by RT PCR 11/02/2020 NEGATIVE  NEGATIVE Final   Comment: (NOTE) SARS-CoV-2 target nucleic acids are NOT DETECTED.  The SARS-CoV-2 RNA is generally detectable in upper respiratory specimens during the acute phase of infection. The lowest concentration of SARS-CoV-2 viral copies this assay can detect is 138 copies/mL. A negative result does not preclude SARS-Cov-2 infection and should not be used as the sole basis for treatment or other patient management decisions. A negative result may occur with  improper specimen collection/handling, submission of specimen other than  nasopharyngeal swab, presence of viral mutation(s) within the areas targeted by this assay, and inadequate number of viral copies(<138 copies/mL). A negative result  must be combined with clinical observations, patient history, and epidemiological information. The expected result is Negative.  Fact Sheet for Patients:  EntrepreneurPulse.com.au  Fact Sheet for Healthcare Providers:  IncredibleEmployment.be  This test is no                          t yet approved or cleared by the [place] FDA and  has been authorized for detection and/or diagnosis of SARS-CoV-2 by FDA under an Emergency Use Authorization (EUA). This EUA will remain  in effect (meaning this test can be used) for the duration of the COVID-19 declaration under Section 564(b)(1) of the Act, 21 U.S.C.section 360bbb-3(b)(1), unless the authorization is terminated  or revoked sooner.       Influenza A by PCR 11/02/2020 NEGATIVE  NEGATIVE Final   Influenza B by PCR 11/02/2020 NEGATIVE  NEGATIVE Final   Comment: (NOTE) The Xpert Xpress SARS-CoV-2/FLU/RSV plus assay is intended as an aid in the diagnosis of influenza from Nasopharyngeal swab specimens and should not be used as a sole basis for treatment. Nasal washings and aspirates are unacceptable for Xpert Xpress SARS-CoV-2/FLU/RSV testing.  Fact Sheet for Patients: EntrepreneurPulse.com.au  Fact Sheet for Healthcare Providers: IncredibleEmployment.be  This test is not yet approved or cleared by the [place] FDA and has been authorized for detection and/or diagnosis of SARS-CoV-2 by FDA under an Emergency Use Authorization (EUA). This EUA will remain in effect (meaning this test can be used) for the duration of the COVID-19 declaration under Section 564(b)(1) of the Act, 21 U.S.C. section 360bbb-3(b)(1), unless the authorization is terminated or revoked.  Performed at [facility], [place] [address], [place], [place] [address]    Sodium 11/02/2020 138  135 - 145 mmol/L Final   Potassium 11/02/2020 3.8  3.5 - 5.1 mmol/L Final    Chloride 11/02/2020 108  98 - 111 mmol/L Final   CO2 11/02/2020 21 (A) 22 - 32 mmol/L Final   Glucose, Bld 11/02/2020 80  70 - 99 mg/dL Final   Glucose reference range applies only to samples taken after fasting for at least 8 hours.   BUN 11/02/2020 17  6 - 20 mg/dL Final   Creatinine, Ser 11/02/2020 1.45 (A) 0.61 - 1.24 mg/dL Final   Calcium 11/02/2020 9.0  8.9 - 10.3 mg/dL Final   Total Protein 11/02/2020 7.4  6.5 - 8.1 g/dL Final   Albumin 11/02/2020 4.3  3.5 - 5.0 g/dL Final   AST 11/02/2020 51 (A) 15 - 41 U/L Final   ALT 11/02/2020 44  0 - 44 U/L Final   Alkaline Phosphatase 11/02/2020 85  38 - 126 U/L Final   Total Bilirubin 11/02/2020 0.5  0.3 - 1.2 mg/dL Final   GFR, Estimated 11/02/2020 56 (A) >60 mL/min Final   Comment: (NOTE) Calculated using the CKD-EPI Creatinine Equation (2021)    Anion gap 11/02/2020 9  5 - 15 Final   Performed at [facility], [place] [address] [place], [place] [address]   Alcohol, Ethyl (B) 11/02/2020 37 (A) <10 mg/dL Final   Comment: (NOTE) Lowest detectable limit for serum alcohol is 10 mg/dL.  For medical purposes only. Performed at [facility], [place] [address], [place], [place] [address]    Opiates 11/02/2020 NONE DETECTED  NONE DETECTED Final  Cocaine 11/02/2020 POSITIVE (A) NONE DETECTED Final   Benzodiazepines 11/02/2020 NONE DETECTED  NONE DETECTED Final   Amphetamines 11/02/2020 NONE DETECTED  NONE DETECTED Final   Tetrahydrocannabinol 11/02/2020 NONE DETECTED  NONE DETECTED Final   Barbiturates 11/02/2020 NONE DETECTED  NONE DETECTED Final   Comment: (NOTE) DRUG SCREEN FOR MEDICAL PURPOSES ONLY.  IF CONFIRMATION IS NEEDED FOR ANY PURPOSE, NOTIFY LAB WITHIN 5 DAYS.  LOWEST DETECTABLE LIMITS FOR URINE DRUG SCREEN Drug Class                     Cutoff (ng/mL) Amphetamine and metabolites    1000 Barbiturate and metabolites    200 Benzodiazepine                 681 Tricyclics and metabolites      300 Opiates and metabolites        300 Cocaine and metabolites        300 THC                            50 Performed at [facility], [place] [address], [place], [place] [address]    WBC 11/02/2020 6.5  4.0 - 10.5 K/uL Final   RBC 11/02/2020 5.25  4.22 - 5.81 MIL/uL Final   Hemoglobin 11/02/2020 14.9  13.0 - 17.0 g/dL Final   HCT 11/02/2020 45.2  39.0 - 52.0 % Final   MCV 11/02/2020 86.1  80.0 - 100.0 fL Final   MCH 11/02/2020 28.4  26.0 - 34.0 pg Final   MCHC 11/02/2020 33.0  30.0 - 36.0 g/dL Final   RDW 11/02/2020 13.3  11.5 - 15.5 % Final   Platelets 11/02/2020 281  150 - 400 K/uL Final   nRBC 11/02/2020 0.0  0.0 - 0.2 % Final   Neutrophils Relative % 11/02/2020 61  % Final   Neutro Abs 11/02/2020 3.9  1.7 - 7.7 K/uL Final   Lymphocytes Relative 11/02/2020 29  % Final   Lymphs Abs 11/02/2020 1.8  0.7 - 4.0 K/uL Final   Monocytes Relative 11/02/2020 8  % Final   Monocytes Absolute 11/02/2020 0.5  0.1 - 1.0 K/uL Final   Eosinophils Relative 11/02/2020 2  % Final   Eosinophils Absolute 11/02/2020 0.1  0.0 - 0.5 K/uL Final   Basophils Relative 11/02/2020 0  % Final   Basophils Absolute 11/02/2020 0.0  0.0 - 0.1 K/uL Final   Immature Granulocytes 11/02/2020 0  % Final   Abs Immature Granulocytes 11/02/2020 0.02  0.00 - 0.07 K/uL Final   Performed at [facility], [place] [address] [place], [place] [address]   Salicylate Lvl 94/49/6759 <7.0 (A) 7.0 - 30.0 mg/dL Final   Performed at [place] [address], [place], [place] [address]   Acetaminophen (Tylenol), Serum 11/02/2020 <10 (A) 10 - 30 ug/mL Final   Comment: (NOTE) Therapeutic concentrations vary significantly. A range of 10-30 ug/mL  may be an effective concentration for many patients. However, some  are best treated at concentrations outside of this range. Acetaminophen concentrations >150 ug/mL at 4 hours after ingestion  and >50 ug/mL at 12 hours after ingestion  are often associated with  toxic reactions.  Performed at [facility], [place] [address] [place], [place] [address]   Admission on 10/22/2020, Discharged on 10/25/2020  Component Date Value Ref Range Status   Opiates 10/22/2020 NONE DETECTED  NONE DETECTED Final  Cocaine 10/22/2020 POSITIVE (A) NONE DETECTED Final   Benzodiazepines 10/22/2020 NONE DETECTED  NONE DETECTED Final   Amphetamines 10/22/2020 NONE DETECTED  NONE DETECTED Final   Tetrahydrocannabinol 10/22/2020 POSITIVE (A) NONE DETECTED Final   Barbiturates 10/22/2020 NONE DETECTED  NONE DETECTED Final   Comment: (NOTE) DRUG SCREEN FOR MEDICAL PURPOSES ONLY.  IF CONFIRMATION IS NEEDED FOR ANY PURPOSE, NOTIFY LAB WITHIN 5 DAYS.  LOWEST DETECTABLE LIMITS FOR URINE DRUG SCREEN Drug Class                     Cutoff (ng/mL) Amphetamine and metabolites    1000 Barbiturate and metabolites    200 Benzodiazepine                 417 Tricyclics and metabolites     300 Opiates and metabolites        300 Cocaine and metabolites        300 THC                            50 Performed at [facility], [place] [address] [place], [place] [address]    SARS Coronavirus 2 by RT PCR 10/22/2020 NEGATIVE  NEGATIVE Final   Comment: (NOTE) SARS-CoV-2 target nucleic acids are NOT DETECTED.  The SARS-CoV-2 RNA is generally detectable in upper respiratory specimens during the acute phase of infection. The lowest concentration of SARS-CoV-2 viral copies this assay can detect is 138 copies/mL. A negative result does not preclude SARS-Cov-2 infection and should not be used as the sole basis for treatment or other patient management decisions. A negative result may occur with  improper specimen collection/handling, submission of specimen other than nasopharyngeal swab, presence of viral mutation(s) within the areas targeted by this assay, and inadequate number of viral copies(<138 copies/mL). A negative  result must be combined with clinical observations, patient history, and epidemiological information. The expected result is Negative.  Fact Sheet for Patients:  EntrepreneurPulse.com.au  Fact Sheet for Healthcare Providers:  IncredibleEmployment.be  This test is no                          t yet approved or cleared by the [place] FDA and  has been authorized for detection and/or diagnosis of SARS-CoV-2 by FDA under an Emergency Use Authorization (EUA). This EUA will remain  in effect (meaning this test can be used) for the duration of the COVID-19 declaration under Section 564(b)(1) of the Act, 21 U.S.C.section 360bbb-3(b)(1), unless the authorization is terminated  or revoked sooner.       Influenza A by PCR 10/22/2020 NEGATIVE  NEGATIVE Final   Influenza B by PCR 10/22/2020 NEGATIVE  NEGATIVE Final   Comment: (NOTE) The Xpert Xpress SARS-CoV-2/FLU/RSV plus assay is intended as an aid in the diagnosis of influenza from Nasopharyngeal swab specimens and should not be used as a sole basis for treatment. Nasal washings and aspirates are unacceptable for Xpert Xpress SARS-CoV-2/FLU/RSV testing.  Fact Sheet for Patients: EntrepreneurPulse.com.au  Fact Sheet for Healthcare Providers: IncredibleEmployment.be  This test is not yet approved or cleared by the [place] FDA and has been authorized for detection and/or diagnosis of SARS-CoV-2 by FDA under an Emergency Use Authorization (EUA). This EUA will remain in effect (meaning this test can be used) for the duration of the COVID-19 declaration under Section 564(b)(1) of the Act, 21 U.S.C. section 360bbb-3(b)(1), unless the  authorization is terminated or revoked.  Performed at [facility], [place] [address] [place], [place] [address]    Color, Urine 10/22/2020 COLORLESS (A) YELLOW Final   APPearance 10/22/2020 CLEAR  CLEAR Final    Specific Gravity, Urine 10/22/2020 1.002 (A) 1.005 - 1.030 Final   pH 10/22/2020 6.0  5.0 - 8.0 Final   Glucose, UA 10/22/2020 NEGATIVE  NEGATIVE mg/dL Final   Hgb urine dipstick 10/22/2020 NEGATIVE  NEGATIVE Final   Bilirubin Urine 10/22/2020 NEGATIVE  NEGATIVE Final   Ketones, ur 10/22/2020 NEGATIVE  NEGATIVE mg/dL Final   Protein, ur 10/22/2020 NEGATIVE  NEGATIVE mg/dL Final   Nitrite 10/22/2020 NEGATIVE  NEGATIVE Final   Leukocytes,Ua 10/22/2020 NEGATIVE  NEGATIVE Final   Performed at [place] [address] [place], [place] [address]   Sodium 10/22/2020 138  135 - 145 mmol/L Final   Potassium 10/22/2020 3.7  3.5 - 5.1 mmol/L Final   Chloride 10/22/2020 107  98 - 111 mmol/L Final   CO2 10/22/2020 22  22 - 32 mmol/L Final   Glucose, Bld 10/22/2020 84  70 - 99 mg/dL Final   Glucose reference range applies only to samples taken after fasting for at least 8 hours.   BUN 10/22/2020 14  6 - 20 mg/dL Final   Creatinine, Ser 10/22/2020 1.07  0.61 - 1.24 mg/dL Final   Calcium 10/22/2020 9.1  8.9 - 10.3 mg/dL Final   Total Protein 10/22/2020 7.7  6.5 - 8.1 g/dL Final   Albumin 10/22/2020 4.3  3.5 - 5.0 g/dL Final   AST 10/22/2020 44 (A) 15 - 41 U/L Final   ALT 10/22/2020 28  0 - 44 U/L Final   Alkaline Phosphatase 10/22/2020 90  38 - 126 U/L Final   Total Bilirubin 10/22/2020 0.6  0.3 - 1.2 mg/dL Final   GFR, Estimated 10/22/2020 >60  >60 mL/min Final   Comment: (NOTE) Calculated using the CKD-EPI Creatinine Equation (2021)    Anion gap 10/22/2020 9  5 - 15 Final   Performed at [facility], [place] [address] [place], [place] [address]   Alcohol, Ethyl (B) 10/22/2020 77 (A) <10 mg/dL Final   Comment: (NOTE) Lowest detectable limit for serum alcohol is 10 mg/dL.  For medical purposes only. Performed at [facility], [place] [address], [place], [place] [address]    WBC 10/22/2020 6.6  4.0 - 10.5 K/uL Final   RBC 10/22/2020  5.05  4.22 - 5.81 MIL/uL Final   Hemoglobin 10/22/2020 14.2  13.0 - 17.0 g/dL Final   HCT 10/22/2020 44.5  39.0 - 52.0 % Final   MCV 10/22/2020 88.1  80.0 - 100.0 fL Final   MCH 10/22/2020 28.1  26.0 - 34.0 pg Final   MCHC 10/22/2020 31.9  30.0 - 36.0 g/dL Final   RDW 10/22/2020 13.3  11.5 - 15.5 % Final   Platelets 10/22/2020 236  150 - 400 K/uL Final   nRBC 10/22/2020 0.0  0.0 - 0.2 % Final   Neutrophils Relative % 10/22/2020 60  % Final   Neutro Abs 10/22/2020 4.0  1.7 - 7.7 K/uL Final   Lymphocytes Relative 10/22/2020 28  % Final   Lymphs Abs 10/22/2020 1.8  0.7 - 4.0 K/uL Final   Monocytes Relative 10/22/2020 7  % Final   Monocytes Absolute 10/22/2020 0.5  0.1 - 1.0 K/uL Final   Eosinophils Relative 10/22/2020 4  % Final   Eosinophils Absolute 10/22/2020 0.3  0.0 -  0.5 K/uL Final   Basophils Relative 10/22/2020 0  % Final   Basophils Absolute 10/22/2020 0.0  0.0 - 0.1 K/uL Final   Immature Granulocytes 10/22/2020 1  % Final   Abs Immature Granulocytes 10/22/2020 0.03  0.00 - 0.07 K/uL Final   Performed at [facility], [place] [address] [place], [place] [address]   Salicylate Lvl 19/14/7829 <7.0 (A) 7.0 - 30.0 mg/dL Final   Performed at [place] [address], [place], [place] [address]   Acetaminophen (Tylenol), Serum 10/22/2020 <10 (A) 10 - 30 ug/mL Final   Comment: (NOTE) Therapeutic concentrations vary significantly. A range of 10-30 ug/mL  may be an effective concentration for many patients. However, some  are best treated at concentrations outside of this range. Acetaminophen concentrations >150 ug/mL at 4 hours after ingestion  and >50 ug/mL at 12 hours after ingestion are often associated with  toxic reactions.  Performed at [facility], [place] [address], [place], [place] [address]   Admission on 08/06/2020, Discharged on 08/07/2020  Component Date Value Ref Range Status   SARS Coronavirus 2 by RT PCR  08/06/2020 NEGATIVE  NEGATIVE Final   Comment: (NOTE) SARS-CoV-2 target nucleic acids are NOT DETECTED.  The SARS-CoV-2 RNA is generally detectable in upper respiratory specimens during the acute phase of infection. The lowest concentration of SARS-CoV-2 viral copies this assay can detect is 138 copies/mL. A negative result does not preclude SARS-Cov-2 infection and should not be used as the sole basis for treatment or other patient management decisions. A negative result may occur with  improper specimen collection/handling, submission of specimen other than nasopharyngeal swab, presence of viral mutation(s) within the areas targeted by this assay, and inadequate number of viral copies(<138 copies/mL). A negative result must be combined with clinical observations, patient history, and epidemiological information. The expected result is Negative.  Fact Sheet for Patients:  EntrepreneurPulse.com.au  Fact Sheet for Healthcare Providers:  IncredibleEmployment.be  This test is no                          t yet approved or cleared by the [place] FDA and  has been authorized for detection and/or diagnosis of SARS-CoV-2 by FDA under an Emergency Use Authorization (EUA). This EUA will remain  in effect (meaning this test can be used) for the duration of the COVID-19 declaration under Section 564(b)(1) of the Act, 21 U.S.C.section 360bbb-3(b)(1), unless the authorization is terminated  or revoked sooner.       Influenza A by PCR 08/06/2020 NEGATIVE  NEGATIVE Final   Influenza B by PCR 08/06/2020 NEGATIVE  NEGATIVE Final   Comment: (NOTE) The Xpert Xpress SARS-CoV-2/FLU/RSV plus assay is intended as an aid in the diagnosis of influenza from Nasopharyngeal swab specimens and should not be used as a sole basis for treatment. Nasal washings and aspirates are unacceptable for Xpert Xpress SARS-CoV-2/FLU/RSV testing.  Fact Sheet for  Patients: EntrepreneurPulse.com.au  Fact Sheet for Healthcare Providers: IncredibleEmployment.be  This test is not yet approved or cleared by the [place] FDA and has been authorized for detection and/or diagnosis of SARS-CoV-2 by FDA under an Emergency Use Authorization (EUA). This EUA will remain in effect (meaning this test can be used) for the duration of the COVID-19 declaration under Section 564(b)(1) of the Act, 21 U.S.C. section 360bbb-3(b)(1), unless the authorization is terminated or revoked.  Performed at [facility], [place] [address],  [place], [place] [address]    WBC 08/07/2020 6.5  4.0 - 10.5 K/uL Final   RBC 08/07/2020 4.49  4.22 - 5.81 MIL/uL Final   Hemoglobin 08/07/2020 12.9 (A) 13.0 - 17.0 g/dL Final   HCT 08/07/2020 39.8  39.0 - 52.0 % Final   MCV 08/07/2020 88.6  80.0 - 100.0 fL Final   MCH 08/07/2020 28.7  26.0 - 34.0 pg Final   MCHC 08/07/2020 32.4  30.0 - 36.0 g/dL Final   RDW 08/07/2020 13.2  11.5 - 15.5 % Final   Platelets 08/07/2020 224  150 - 400 K/uL Final   nRBC 08/07/2020 0.0  0.0 - 0.2 % Final   Neutrophils Relative % 08/07/2020 49  % Final   Neutro Abs 08/07/2020 3.2  1.7 - 7.7 K/uL Final   Lymphocytes Relative 08/07/2020 38  % Final   Lymphs Abs 08/07/2020 2.5  0.7 - 4.0 K/uL Final   Monocytes Relative 08/07/2020 8  % Final   Monocytes Absolute 08/07/2020 0.5  0.1 - 1.0 K/uL Final   Eosinophils Relative 08/07/2020 3  % Final   Eosinophils Absolute 08/07/2020 0.2  0.0 - 0.5 K/uL Final   Basophils Relative 08/07/2020 1  % Final   Basophils Absolute 08/07/2020 0.0  0.0 - 0.1 K/uL Final   Immature Granulocytes 08/07/2020 1  % Final   Abs Immature Granulocytes 08/07/2020 0.04  0.00 - 0.07 K/uL Final   Performed at [facility], [place] [address] [place], [place] [address]   Sodium 08/07/2020 139  135 - 145 mmol/L Final   Potassium 08/07/2020 4.6  3.5 - 5.1 mmol/L Final   Chloride 08/07/2020 108  98  - 111 mmol/L Final   CO2 08/07/2020 21 (A) 22 - 32 mmol/L Final   Glucose, Bld 08/07/2020 72  70 - 99 mg/dL Final   Glucose reference range applies only to samples taken after fasting for at least 8 hours.   BUN 08/07/2020 14  6 - 20 mg/dL Final   Creatinine, Ser 08/07/2020 1.16  0.61 - 1.24 mg/dL Final   Calcium 08/07/2020 9.0  8.9 - 10.3 mg/dL Final   Total Protein 08/07/2020 6.4 (A) 6.5 - 8.1 g/dL Final   Albumin 08/07/2020 3.6  3.5 - 5.0 g/dL Final   AST 08/07/2020 72 (A) 15 - 41 U/L Final   ALT 08/07/2020 55 (A) 0 - 44 U/L Final   Alkaline Phosphatase 08/07/2020 55  38 - 126 U/L Final   Total Bilirubin 08/07/2020 0.8  0.3 - 1.2 mg/dL Final   GFR, Estimated 08/07/2020 >60  >60 mL/min Final   Comment: (NOTE) Calculated using the CKD-EPI Creatinine Equation (2021)    Anion gap 08/07/2020 10  5 - 15 Final   Performed at [place] [address] [place], [place] [address]   Alcohol, Ethyl (B) 08/07/2020 27 (A) <10 mg/dL Final   Comment: (NOTE) Lowest detectable limit for serum alcohol is 10 mg/dL.  For medical purposes only. Performed at [facility], [place] [address], [place], [place] [address]    TSH 08/07/2020 0.509  0.350 - 4.500 uIU/mL Final   Comment: Performed by a 3rd Generation assay with a functional sensitivity of <=0.01 uIU/mL. Performed at [facility], [place] [address], [place], [place] [address]    POC Amphetamine UR 08/06/2020 None Detected  NONE DETECTED (Cut Off Level 1000 ng/mL) Final   POC Secobarbital (BAR) 08/06/2020 None Detected  NONE DETECTED (Cut Off Level 300 ng/mL) Final  POC Buprenorphine (BUP) 08/06/2020 None Detected  NONE DETECTED (Cut Off Level 10 ng/mL) Final   POC Oxazepam (BZO) 08/06/2020 None Detected  NONE DETECTED (Cut Off Level 300 ng/mL) Final   POC Cocaine UR 08/06/2020 Positive (A) NONE DETECTED (Cut Off Level 300 ng/mL) Final   POC Methamphetamine UR 08/06/2020 None Detected  NONE DETECTED (Cut Off Level 1000 ng/mL)  Final   POC Morphine 08/06/2020 Positive (A) NONE DETECTED (Cut Off Level 300 ng/mL) Final   POC Oxycodone UR 08/06/2020 None Detected  NONE DETECTED (Cut Off Level 100 ng/mL) Final   POC Methadone UR 08/06/2020 None Detected  NONE DETECTED (Cut Off Level 300 ng/mL) Final   POC Marijuana UR 08/06/2020 Positive (A) NONE DETECTED (Cut Off Level 50 ng/mL) Final   SARS Coronavirus 2 Ag 08/06/2020 Negative  Negative Final   SARSCOV2ONAVIRUS 2 AG 08/06/2020 NEGATIVE  NEGATIVE Final   Comment: (NOTE) SARS-CoV-2 antigen NOT DETECTED.   Negative results are presumptive.  Negative results do not preclude SARS-CoV-2 infection and should not be used as the sole basis for treatment or other patient management decisions, including infection  control decisions, particularly in the presence of clinical signs and  symptoms consistent with COVID-19, or in those who have been in contact with the virus.  Negative results must be combined with clinical observations, patient history, and epidemiological information. The expected result is Negative.  Fact Sheet for Patients: HandmadeRecipes.com.cy  Fact Sheet for Healthcare Providers: FuneralLife.at  This test is not yet approved or cleared by the [place] FDA and  has been authorized for detection and/or diagnosis of SARS-CoV-2 by FDA under an Emergency Use Authorization (EUA).  This EUA will remain in effect (meaning this test can be used) for the duration of  the COV                          ID-19 declaration under Section 564(b)(1) of the Act, 21 U.S.C. section 360bbb-3(b)(1), unless the authorization is terminated or revoked sooner.      Allergies: Patient has no known allergies.  PTA Medications: (Not in a hospital admission)   Medical Decision Making  Patient continuous to report suicidal ideation and unable to contract for safety at this time. Patient will be admitted to [facility] for  continuous assessment with follow by psychiatry on 11/03/2020.   -Review labs  -monitor pt for alcohol withdraw symptoms/CIWA protocol -continue home medication    Recommendations  Based on my evaluation the patient does not appear to have an emergency medical condition.  Ophelia Shoulder, NP 11/03/20  2:24 AM

## 2020-11-02 NOTE — ED Triage Notes (Signed)
Pt would like detox from crack and marijuana. He also would like help with depression. He denies SI/HI or AV hallucinations at this time.

## 2020-11-03 MED ORDER — IRBESARTAN 75 MG PO TABS
150.0000 mg | ORAL_TABLET | Freq: Every day | ORAL | Status: DC
  Filled 2020-11-03: qty 2

## 2020-11-03 MED ORDER — THIAMINE HCL 100 MG PO TABS
100.0000 mg | ORAL_TABLET | Freq: Every day | ORAL | Status: DC
Start: 2020-11-04 — End: 2020-11-03

## 2020-11-03 MED ORDER — ADULT MULTIVITAMIN W/MINERALS CH: 1.0000 | ORAL_TABLET | Freq: Every day | ORAL | Status: DC

## 2020-11-03 MED ORDER — THIAMINE HCL 100 MG/ML IJ SOLN
100.0000 mg | Freq: Once | INTRAMUSCULAR | Status: AC
  Administered 2020-11-03: 100 mg via INTRAMUSCULAR
  Filled 2020-11-03: qty 2

## 2020-11-03 MED ORDER — PANTOPRAZOLE SODIUM 40 MG PO TBEC
40.0000 mg | DELAYED_RELEASE_TABLET | Freq: Every day | ORAL | Status: DC
Start: 2020-11-03 — End: 2020-11-03
  Administered 2020-11-03: 40 mg via ORAL
  Filled 2020-11-03: qty 1

## 2020-11-03 MED ORDER — VALSARTAN-HYDROCHLOROTHIAZIDE 160-12.5 MG PO TABS: 1.0000 | ORAL_TABLET | Freq: Every day | ORAL | Status: DC

## 2020-11-03 MED ORDER — NICOTINE 21 MG/24HR TD PT24
21.0000 mg | MEDICATED_PATCH | Freq: Every day | TRANSDERMAL | Status: DC
  Filled 2020-11-03: qty 1

## 2020-11-03 MED ORDER — ONDANSETRON 4 MG PO TBDP: 4.0000 mg | ORAL_TABLET | Freq: Four times a day (QID) | ORAL | Status: DC | PRN

## 2020-11-03 MED ORDER — ADULT MULTIVITAMIN W/MINERALS CH
1.0000 | ORAL_TABLET | Freq: Every day | ORAL | Status: DC
Start: 2020-11-03 — End: 2020-11-03
  Administered 2020-11-03: 1 via ORAL
  Filled 2020-11-03: qty 1

## 2020-11-03 MED ORDER — HYDROCHLOROTHIAZIDE 12.5 MG PO CAPS
12.5000 mg | ORAL_CAPSULE | Freq: Every day | ORAL | Status: DC
  Filled 2020-11-03: qty 1

## 2020-11-03 MED ORDER — LOPERAMIDE HCL 2 MG PO CAPS: 2.0000 mg | ORAL_CAPSULE | ORAL | Status: DC | PRN

## 2020-11-03 MED ORDER — NICOTINE 21 MG/24HR TD PT24: 21.0000 mg | MEDICATED_PATCH | Freq: Every day | TRANSDERMAL | 0 refills | Status: DC

## 2020-11-03 MED ORDER — LORAZEPAM 1 MG PO TABS: 1.0000 mg | ORAL_TABLET | Freq: Four times a day (QID) | ORAL | Status: DC | PRN

## 2020-11-03 MED ORDER — RISPERIDONE 0.5 MG PO TABS
0.5000 mg | ORAL_TABLET | Freq: Two times a day (BID) | ORAL | Status: DC
  Administered 2020-11-03: 0.5 mg via ORAL
  Filled 2020-11-03: qty 1

## 2020-11-03 MED ORDER — THIAMINE HCL 100 MG PO TABS: 100.0000 mg | ORAL_TABLET | Freq: Every day | ORAL | Status: DC

## 2020-11-03 MED ORDER — HYDROXYZINE HCL 25 MG PO TABS: 25.0000 mg | ORAL_TABLET | Freq: Four times a day (QID) | ORAL | Status: DC | PRN

## 2020-11-03 MED ORDER — AMLODIPINE BESYLATE 5 MG PO TABS
5.0000 mg | ORAL_TABLET | Freq: Every day | ORAL | Status: DC
  Filled 2020-11-03: qty 1

## 2020-11-03 NOTE — ED Notes (Addendum)
Assuming care at this time pt in bed attempting to sleep no distress or request by pt noted

## 2020-11-03 NOTE — Progress Notes (Signed)
Patient alert and oriented X 4. Received scheduled medication, although patient refused blood pressure medication even after being educated about elevated reading. Patient states, " I go through this all the time, that medicine makes me sick. When I leave and go to a rehab I want to be off of all drugs." RN informed Physician. Breakfast offered. Patient remains safe. Staff will continue to monitor.

## 2020-11-03 NOTE — ED Notes (Signed)
[facility] transfer, pt presents with suicidal ideations, plan to overdose on his medications.   Pt requesting long term drug rehab.  Denies HI or AVH.  Skin search completed, monitoring for safety, no distress noted.

## 2020-11-03 NOTE — Discharge Instructions (Signed)
Pt is discharged to [facility] in [place]  for Detox.   Patient agreeable to plan.  Given opportunity to ask questions.  Appears to feel comfortable with discharge denies any current suicidal or homicidal thought. Patient is also instructed prior to discharge to: Take all medications as prescribed by his mental healthcare provider. Report any adverse effects and or reactions from the medicines to his outpatient provider promptly. Patient has been instructed & cautioned: To not engage in alcohol and or illegal drug use while on prescription medicines. In the event of worsening symptoms, patient is instructed to call the crisis hotline, 911 and or go to the nearest ED for appropriate evaluation and treatment of symptoms. To follow-up with his primary care provider for your other medical issues, concerns and or health care needs.

## 2020-11-03 NOTE — ED Notes (Signed)
Snack given '@11'$ :30

## 2020-11-03 NOTE — ED Provider Notes (Signed)
FBC/OBS ASAP Discharge Summary  Date and Time: 11/03/2020 10:31 AM  Name: Ethan [name]  MRN:  [id number]   Discharge Diagnoses:  Final diagnoses:  Schizoaffective disorder, depressive type ([place])    Subjective: Pt is seen and examined today. Pt states his mood is ok . Pt rates his mood at 5/10 (10 is the best mood). Pt slept well last night with pills.  Pt states his appetite is good. Pt denies any anxiety at this time.  Currently, Pt denies any suicidal ideation, homicidal ideation and, visual and auditory hallucination.  He states he has bipolar schizoaffective disorder so he hears voices on and off but denies it today.  He states he he sees some shadows going in front of his eyes on and off but denies it today.  He stated he is interested in long-term substance abuse inpatient program preferably at [facility]  He states he forgot his wallet at [facility] and would like social worker to help him get that back.  Pt denies any headache, nausea, vomiting, dizziness, chest pain, SOB, abdominal pain, diarrhea, and constipation. Pt denies any medication side effects and has been tolerating it well. Pt denies any concerns.   Stay Summary: Ethan [name] is a 55 yo male. Patient presented to [facility] for the evaluation of suicidal ideation and alcohol and illicit drug detox.  Patient was planned to send to [facility] but due to conflict with scheduled internal medicine appointment the plan fell through.  Patient was transferred to [facility] for continuous assessment.  Patient reported that he is feeling suicidal because he is struggling with drugs and alcohol addiction and wants to go to long-term substance abuse program. Pt was admitted to the observation unit at [facility]. Pt was restarted on home meds. Pt was reevaluated this morning. Today, he reported improved mood and anxiety and denied SI, HI and AVH. Denied any side effects from medications. Patient  is interested in going to long-term substance abuse treatment program.  CSW talked to provider at [facility] residential center but they want patient to get detox first. Pt is discharged to [facility] in [place] for Detox. After Detox Pt can request to go to [facility] center for long-term substance abuse rehab.  Total Time spent with patient: 20 minutes  Past Psychiatric History: Schizoaffective disorder, polysubstance use, alcohol use disorder Past Medical History:  Past Medical History:  Diagnosis Date   Alcoholism ([place])    Anxiety    Depression    GERD (gastroesophageal reflux disease)    Heart murmur    Hepatitis C    Treated in prison for 18 months.  Sounds like Ribaviron and Interferon  2009-2011   Hypertension    Peptic ulcer    Substance abuse ([place])    Urge urinary incontinence    07/20/2015:  evaluated by Dr. Kathie Rhodes, Urodynamics planned    Past Surgical History:  Procedure Laterality Date   APPENDECTOMY     INGUINAL HERNIA REPAIR Bilateral    Family History:  Family History  Problem Relation Age of Onset   Breast cancer Sister    Breast cancer Maternal Grandmother    Colon polyps Brother    Diabetes Sister        x 2   Diabetes Maternal Aunt    Diabetes Paternal Aunt    Diabetes Mother        DM complications cause of death   Alcohol abuse Father  COPD Neg Hx    Family Psychiatric History: None per chart Social History:  Social History   Substance and Sexual Activity  Alcohol Use Yes   Alcohol/week: 0.0 standard drinks   Comment: 3 beers nightly     Social History   Substance and Sexual Activity  Drug Use Yes   Types: "Crack" cocaine   Comment: Occassionally MJ currently--in past crack cocaine--more than 16 years ago.    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 [employer]  Tobacco Use   Smoking status: Every Day    Packs/day: 0.50    Years: 12.00    Pack years: 6.00    Types: Cigarettes    Start date: 07/14/1976   Smokeless tobacco: Never   Tobacco comments:    Stopped for 16 years.  Restarted with bout of depression/anxiety recently  Substance and Sexual Activity   Alcohol use: Yes    Alcohol/week: 0.0 standard drinks    Comment: 3 beers nightly   Drug use: Yes    Types: "Crack" cocaine    Comment: Occassionally MJ currently--in past crack cocaine--more than 16 years ago.   Sexual activity: Yes    Partners: Female    Birth control/protection: None    Comment: WIFE USED MAJUANA SATURDAY  Other Topics Concern   Not on file  Social History Narrative   Lost job and schooling secondary to "past"   Apparently for what he was incarcerated for.   Social Determinants of Health   Financial Resource Strain: High Risk   Difficulty of Paying Living Expenses: Hard  Food Insecurity: Food Insecurity Present   Worried About Running Out of Food in the Last Year: Sometimes true   Ran Out of Food in the Last Year: Sometimes true  Transportation Needs: No Transportation Needs   Lack of Transportation (Medical): No   Lack of Transportation (Non-Medical): No  Physical Activity: Insufficiently Active   Days of Exercise per Week: 1 day   Minutes of Exercise per Session: 30 min  Stress: Not on file  Social Connections: Unknown   Frequency of Communication with Friends and Family: More than three times a week   Frequency of Social Gatherings with Friends and Family: More than three times a week   Attends Religious Services: Not on Electrical engineer or Organizations: Not on file   Attends Archivist Meetings: Not on file   Marital Status: Not on file   SDOH:  SDOH Screenings   Alcohol Screen: Not on file  Depression (PHQ2-9): Medium Risk   PHQ-2 Score: 20   Financial Resource Strain: High Risk   Difficulty of Paying Living Expenses: Hard  Food Insecurity: Landscape architect Present   Worried About Charity fundraiser in the Last Year: Sometimes true   Arboriculturist in the Last Year: Sometimes true  Housing: High Risk   Last Housing Risk Score: 2  Physical Activity: Insufficiently Active   Days of Exercise per Week: 1 day   Minutes of Exercise per Session: 30 min  Social Connections: Unknown   Frequency of Communication with Friends and Family: More than three times a week   Frequency of Social Gatherings with Friends and  Family: More than three times a week   Attends Religious Services: Not on file   Active Member of Clubs or Organizations: Not on file   Attends Archivist Meetings: Not on file   Marital Status: Not on file  Stress: Not on file  Tobacco Use: High Risk   Smoking Tobacco Use: Every Day   Smokeless Tobacco Use: Never  Transportation Needs: No Transportation Needs   Lack of Transportation (Medical): No   Lack of Transportation (Non-Medical): No    Tobacco Cessation:  A prescription for an FDA-approved tobacco cessation medication provided at discharge  Current Medications:  Current Facility-Administered Medications  Medication Dose Route Frequency Provider Last Rate Last Admin   acetaminophen (TYLENOL) tablet 650 mg  650 mg Oral Q6H PRN Ajibola, Ene A, NP       alum & mag hydroxide-simeth (MAALOX/MYLANTA) 200-200-20 MG/5ML suspension 30 mL  30 mL Oral Q4H PRN Ajibola, Ene A, NP       amLODipine (NORVASC) tablet 5 mg  5 mg Oral Daily Ajibola, Ene A, NP       irbesartan (AVAPRO) tablet 150 mg  150 mg Oral Daily Ajibola, Ene A, NP       And   hydrochlorothiazide (MICROZIDE) capsule 12.5 mg  12.5 mg Oral Daily Ajibola, Ene A, NP       hydrOXYzine (ATARAX/VISTARIL) tablet 25 mg  25 mg Oral Q6H PRN Ajibola, Ene A, NP       loperamide (IMODIUM) capsule 2-4 mg  2-4 mg Oral PRN Ajibola, Ene A, NP       LORazepam  (ATIVAN) tablet 1 mg  1 mg Oral Q6H PRN Ajibola, Ene A, NP       magnesium hydroxide (MILK OF MAGNESIA) suspension 30 mL  30 mL Oral Daily PRN Ajibola, Ene A, NP       multivitamin with minerals tablet 1 tablet  1 tablet Oral Daily Ajibola, Ene A, NP   1 tablet at 11/03/20 0926   nicotine (NICODERM CQ - dosed in mg/24 hours) patch 21 mg  21 mg Transdermal Daily Ajibola, Ene A, NP       ondansetron (ZOFRAN-ODT) disintegrating tablet 4 mg  4 mg Oral Q6H PRN Ajibola, Ene A, NP       pantoprazole (PROTONIX) EC tablet 40 mg  40 mg Oral Daily Ajibola, Ene A, NP   40 mg at 11/03/20 [id number]   risperiDONE (RISPERDAL) tablet 0.5 mg  0.5 mg Oral BID Ajibola, Ene A, NP   0.5 mg at 11/03/20 0926   [START ON 11/04/2020] thiamine tablet 100 mg  100 mg Oral Daily Ajibola, Ene A, NP       traZODone (DESYREL) tablet 50 mg  50 mg Oral QHS PRN Ajibola, Ene A, NP       Current Outpatient Medications  Medication Sig Dispense Refill   amLODipine (NORVASC) 5 MG tablet Take 1 tablet (5 mg total) by mouth daily. 30 tablet 0   hydrOXYzine (ATARAX/VISTARIL) 25 MG tablet Take 1 tablet (25 mg total) by mouth every 6 hours as needed for anxiety. 120 tablet 0   pantoprazole (PROTONIX) 40 MG tablet Take 1 tablet (40 mg total) by mouth daily. 30 tablet 0   risperiDONE (RISPERDAL) 0.5 MG tablet Take 1 tablet (0.5 mg total) by mouth 2 (two) times daily. 60 tablet 0   traZODone (DESYREL) 50 MG tablet Take 1 tablet (50 mg total) by mouth at bedtime and may repeat dose one time if needed. [place]  tablet 0   valsartan-hydrochlorothiazide (DIOVAN-HCT) 160-12.5 MG tablet Take 1 tablet by mouth daily. 30 tablet 0    PTA Medications: (Not in a hospital admission)   Musculoskeletal  Strength & Muscle Tone: within normal limits Gait & Station: normal Patient leans: N/A  Psychiatric Specialty Exam  Presentation  General Appearance: Appropriate for Environment  Eye Contact:Good  Speech:Clear and Coherent  Speech  Volume:Normal  Handedness:Right   Mood and Affect  Mood:Dysphoric  Affect:Congruent   Thought Process  Thought Processes:Coherent  Descriptions of Associations:Intact  Orientation:Full (Time, Place and Person)  Thought Content:WDL  Diagnosis of Schizophrenia or Schizoaffective disorder in past: Yes  Duration of Psychotic Symptoms: Greater than six months   Hallucinations:Hallucinations: None (Off and On sees some shadows, things moving. Denies today.) Description of Auditory Hallucinations: Off and on . Denies it now.  Ideas of Reference:None  Suicidal Thoughts:Suicidal Thoughts: No SI Active Intent and/or Plan: Without Intent; Without Plan  Homicidal Thoughts:Homicidal Thoughts: No HI Passive Intent and/or Plan: Without Intent; Without Plan   Sensorium  Memory:Immediate Good; Recent Good; Remote Fair  Judgment:Poor  Insight:Fair   Executive Functions  Concentration:Good  Attention Span:Good  [place]  Language:Good   Psychomotor Activity  Psychomotor Activity:Psychomotor Activity: Normal   Assets  Assets:Physical Health; Desire for Improvement; Communication Skills   Sleep  Sleep:Sleep: Fair Number of Hours of Sleep: 5   Nutritional Assessment (For OBS and FBC admissions only) Has the patient had a weight loss or gain of 10 pounds or more in the last 3 months?: Yes Has the patient had a decrease in food intake/or appetite?: Yes Does the patient have dental problems?: No Does the patient have eating habits or behaviors that may be indicators of an eating disorder including binging or inducing vomiting?: No Has the patient recently lost weight without trying?: Yes, 34 lbs. or greater Has the patient been eating poorly because of a decreased appetite?: No Malnutrition Screening Tool Score: 4 Nutritional Assessment Referrals: Refer to Social Work for [employer]    Physical Exam  Physical Exam Vitals and  nursing note reviewed.  Constitutional:      General: He is not in acute distress.    Appearance: Normal appearance. He is not ill-appearing, toxic-appearing or diaphoretic.  HENT:     Head: Normocephalic and atraumatic.  Pulmonary:     [place]: Pulmonary effort is normal.  Musculoskeletal:        General: Normal range of motion.  Neurological:     General: No focal deficit present.     Mental Status: He is alert and oriented to person, place, and time.   Review of Systems  Constitutional:  Negative for chills and fever.  HENT:  Negative for hearing loss.   Eyes:  Negative for blurred vision.  Respiratory:  Negative for cough.   Cardiovascular:  Negative for chest pain.  Gastrointestinal:  Negative for nausea and vomiting.  Genitourinary:  Negative for dysuria.  Musculoskeletal:  Negative for myalgias.  Neurological:  Negative for dizziness and headaches.  Psychiatric/Behavioral:  Positive for depression and substance abuse. Negative for hallucinations, memory loss and suicidal ideas. The patient is not nervous/anxious and does not have insomnia.   Blood pressure (!) 144/105, pulse 72, temperature 97.6 F (36.4 C), temperature source Oral, resp. rate 18, SpO2 99 %. There is no height or weight on file to calculate BMI.  Demographic Factors:  Male, Low socioeconomic status, Living alone, and Unemployed  Loss Factors: Decline in physical  health  Historical Factors: NA  Risk Reduction Factors:   NA  Continued Clinical Symptoms:  Depression:   Anhedonia Comorbid alcohol abuse/dependence Hopelessness Schizophrenia:   Depressive state  Cognitive Features That Contribute To Risk:  None    Suicide Risk:  Mild:  Suicidal ideation of limited frequency, intensity, duration, and specificity.  There are no identifiable plans, no associated intent, mild dysphoria and related symptoms, good self-control (both objective and subjective assessment), few other risk factors, and  identifiable protective factors, including available and accessible social support.  Plan Of Care/Follow-up recommendations:  Activity:  As Tolerated -Labs reviewed: UDS positive for cocaine, CBC and CMP within normal limits, alcohol level 37 salicylate level less than 7 acetaminophen level less than 10 -Continue current medication. Disposition: Patient does not meet criteria for inpatient hospitalization.  Patient is interested in going to long-term substance abuse rehab program. CSW talked to provider at [facility] center but they want patient to get detox first. Pt is discharged to [facility] in [place] for Detox. After Detox Pt can request to go to [facility] center for long-term substance abuse rehab.   Armando Reichert, MD 11/03/2020, 10:31 AM

## 2020-11-03 NOTE — Progress Notes (Signed)
CSW spoke with the patient via phone to identify his treatment goals.  Patient stated that he would like long-term treatment.    Patient stated several times that he does not need or want to detox.  CSW explained that patient may need to detox and then transition to treatment depending on the substances used, however, patient was not receptive to this information.   Pt reports the following substance use: Alcohol: "daily, 12 pack"; Marijuana: "daily until I pass out"; Cocaine: "I smoke it daily until I can't stop"  CSW spoke with the patient about programs that are available to him and patient just kept stating "whatever you find ma'am".  Pt appeared unwilling to discuss the possible programs.  Patient became frustrated when CSW explained that she would call to identify a bed for the patient, however patient would likely have to complete an interview with the locations.   CSW reviewed [facility], [facility] and [facility].  CSW explained that no promises on bed availability.  Pt just stated "I can see where this is going y'all trying to put me on the street."  Patient appeared overwhelmed and stated several times that he felt no one was helping him.  He reports that "if you all let me out of here I am going to kill myself.  That's like murder with me telling you all that". CSW attempted to provide reassurance, however, patient was not receptive.  CSW made Dr. Rosita Kea aware of pt's statements.  CSW contacted the following in an effort to locate a bed. [facility]- pt declined this due to physical aspect required and reports that he is legally blind  [facility]--CSW spoke with June who reports that patient needs to detox prior to admission, then once detoxed have the facility call for admission, she reports that they do have male beds available.  [facility]- no male beds available at this time  [facility]-left HIPAA compliant voicemail.  [facility]--asked to call back in one hour  [facility]-current waiting list, could send clinical assessment to start the process [phone] attention [facility]--Don not accept pts insurance  Assunta Curtis, MSW, [place] 11/03/2020 11:34 AM

## 2020-11-03 NOTE — Progress Notes (Signed)
CSW spoke with staff at the [facility].  CSW was informed that there are 2 beds currently available.    CSW was informed that she would need to speak with the centralized nurse Rhina Brackett, [phone].  CSW called nurse and who was confused as to why this CSW was told to call.  She stated that patient would need to come in for an assessment.  Assunta Curtis, MSW, LCSW 11/03/2020 12:54 PM

## 2020-11-03 NOTE — ED Notes (Signed)
Patient received AVS, with community resources. [facility] with [facility] by [facility]. All belongings given to patient from [facility] locker. Patient denies SI/HI and AVH at time of discharge.

## 2020-11-09 ENCOUNTER — Ambulatory Visit: Payer: Medicaid Other | Admitting: Licensed Clinical Social Worker

## 2020-11-09 NOTE — Patient Instructions (Addendum)
Opened in error.    Milus Height, [place]  Social Worker [facility] Care Management  [phone]

## 2020-11-09 NOTE — Patient Instructions (Signed)
Visit Information  Instructions: patient will work with SW to address concerns related to mental health.  Patient was given the following information about care management and care coordination services today, agreed to services, and gave verbal consent: 1.care management/care coordination services include personalized support from designated clinical staff supervised by their physician, including individualized plan of care and coordination with other care providers 2. 24/7 contact phone numbers for assistance for urgent and routine care needs. 3. The patient may stop care management/care coordination services at any time by phone call to the office staff.  Patient verbalizes understanding of instructions provided today and agrees to view in [place].   The care management team will reach out to the patient again over the next 30 days.   [name], Ethan  Social Worker [facility] Care Management  [phone]

## 2020-11-09 NOTE — Chronic Care Management (AMB) (Signed)
  Care Management   Social Work Visit Note  11/09/2020 Name: Ethan [name] MRN: [id number] DOB: [birth year]  Ethan [name] is a 55 y.o. year old male who sees Riesa Pope, MD for primary care. The care management team was consulted for assistance with care management and care coordination needs related to Level of Care Concerns   Patient was given the following information about care management and care coordination services today, agreed to services, and gave verbal consent: 1.care management/care coordination services include personalized support from designated clinical staff supervised by their physician, including individualized plan of care and coordination with other care providers 2. 24/7 contact phone numbers for assistance for urgent and routine care needs. 3. The patient may stop care management/care coordination services at any time by phone call to the office staff.  Engaged with patient by telephone for follow up visit in response to provider referral for social work chronic care management and care coordination services.  Assessment: Review of patient history, allergies, and health status during evaluation of patient need for care management/care coordination services.    Interventions:  SW spoke with [name]. Ms. Laurance Flatten confirmed the patient is at [name] in [place], [place]. She reports he is doing "Ok". Patient will transition this week to [facility] in [place], [place]. Ms. Laurance Flatten is not sure of the exact date.  SW inquired on how Ms. Laurance Flatten was feeling. Ms. Laurance Flatten reports feeling ok and doing better. SW advise Ms. Moore to contact SW if assistance is needed.      Plan:  SW will follow up with patient in the next 30 days.   Milus Height, [place]  Social Worker [facility] Care Management  [phone]

## 2020-11-30 ENCOUNTER — Encounter: Payer: Medicaid Other | Admitting: Internal Medicine

## 2020-12-06 ENCOUNTER — Other Ambulatory Visit: Payer: Self-pay

## 2020-12-06 ENCOUNTER — Encounter: Payer: Self-pay | Admitting: Internal Medicine

## 2020-12-06 ENCOUNTER — Ambulatory Visit: Payer: Medicaid Other | Admitting: Internal Medicine

## 2020-12-06 VITALS — BP 141/92 | HR 81 | Temp 98.0°F | Ht 69.0 in | Wt 176.7 lb

## 2020-12-06 DIAGNOSIS — Z87898 Personal history of other specified conditions: Secondary | ICD-10-CM

## 2020-12-06 DIAGNOSIS — F251 Schizoaffective disorder, depressive type: Secondary | ICD-10-CM

## 2020-12-06 DIAGNOSIS — M79671 Pain in right foot: Secondary | ICD-10-CM

## 2020-12-06 DIAGNOSIS — M79672 Pain in left foot: Secondary | ICD-10-CM | POA: Diagnosis not present

## 2020-12-06 DIAGNOSIS — F1420 Cocaine dependence, uncomplicated: Secondary | ICD-10-CM | POA: Diagnosis not present

## 2020-12-06 DIAGNOSIS — F1010 Alcohol abuse, uncomplicated: Secondary | ICD-10-CM | POA: Diagnosis not present

## 2020-12-06 DIAGNOSIS — R351 Nocturia: Secondary | ICD-10-CM

## 2020-12-06 DIAGNOSIS — N401 Enlarged prostate with lower urinary tract symptoms: Secondary | ICD-10-CM

## 2020-12-06 DIAGNOSIS — M79673 Pain in unspecified foot: Secondary | ICD-10-CM | POA: Insufficient documentation

## 2020-12-06 DIAGNOSIS — N529 Male erectile dysfunction, unspecified: Secondary | ICD-10-CM

## 2020-12-06 DIAGNOSIS — I10 Essential (primary) hypertension: Secondary | ICD-10-CM | POA: Diagnosis not present

## 2020-12-06 LAB — POCT GLYCOSYLATED HEMOGLOBIN (HGB A1C): Hemoglobin A1C: 5.8 % — AB (ref 4.0–5.6)

## 2020-12-06 LAB — GLUCOSE, CAPILLARY: Glucose-Capillary: 133 mg/dL — ABNORMAL HIGH (ref 70–99)

## 2020-12-06 MED ORDER — VALSARTAN-HYDROCHLOROTHIAZIDE 160-12.5 MG PO TABS: 1.0000 | ORAL_TABLET | Freq: Every day | ORAL | 1 refills | Status: DC

## 2020-12-06 MED ORDER — AMLODIPINE BESYLATE 5 MG PO TABS: 5.0000 mg | ORAL_TABLET | Freq: Every day | ORAL | 1 refills | Status: DC

## 2020-12-06 MED ORDER — TADALAFIL 5 MG PO TABS: 5.0000 mg | ORAL_TABLET | Freq: Every day | ORAL | 2 refills | Status: DC

## 2020-12-06 NOTE — Assessment & Plan Note (Addendum)
Patient endorses urinary frequency, nocturia and feeling like he is incompletely voiding.  He states that he sleeps with the bucket next to his bed and wakes up at least 10-15 times at night.  He states his stream is strong.  Denies any blood in his urine.  States he remembers having his prostate checked in the past and it was normal.  Unable to elaborate on what was checked at that time.  -Start tadalafil 5 mg daily for both BPH and ED -Follow-up PSA  Addendum: PSA normal  ADDENDUM #2 Patient's insurance denied tadalafil.  Patient must fail on preferred therapies first.  Will treat with tamsulosin

## 2020-12-06 NOTE — Progress Notes (Signed)
   CC: Foot pain, erectile dysfunction  HPI:  Mr.Mclean E Laplume is a 55 y.o. with a past medical history listed below presenting for evaluation of erectile dysfunction and bilateral foot pain.  Past Medical History:  Diagnosis Date   Alcoholism ([place])    Anxiety    Depression    GERD (gastroesophageal reflux disease)    Heart murmur    Hepatitis C    Treated in prison for 18 months.  Sounds like Ribaviron and Interferon  2009-2011   Hypertension    Peptic ulcer    Substance abuse ([place])    Urge urinary incontinence    07/20/2015:  evaluated by Dr. Kathie Rhodes, Urodynamics planned   Review of Systems: Negative except as per assessment and plan  Physical Exam:  Vitals:   12/06/20 1329  BP: (!) 141/92  Pulse: 81  Temp: 98 F (36.7 C)  SpO2: 100%  Weight: 176 lb 11.2 oz (80.2 kg)  Height: '5\' 9"'$  (1.753 m)    Physical Exam General: alert, appears stated age, in no acute distress HEENT: Normocephalic, atraumatic, EOM intact, conjunctiva normal CV: Regular rate and rhythm, no murmurs rubs or gallops Pulm: Clear to auscultation bilaterally, normal work of breathing Abdomen: Soft, nondistended, bowel sounds present, no tenderness to palpation MSK: No lower extremity edema Skin: Warm and dry Neuro: Alert and oriented x3   Assessment & Plan:   See Encounters Tab for problem based charting.  Patient discussed with Dr. Dareen Piano

## 2020-12-06 NOTE — Assessment & Plan Note (Signed)
Patient states that he was recently in rehab for 2 weeks for both alcohol and cocaine use.  States he did not use any alcohol or illicit drugs at that time.  However since discharge he has been drinking about 2 beers a day and using cocaine as well.  He denies any history of withdrawal or hospitalizations for alcohol withdrawal.  Encouraged alcohol cessation and discussed medications that could help suppress cravings.  Patient is not interested in taking more medications at this time.

## 2020-12-06 NOTE — Patient Instructions (Signed)
I will call you with the results of your blood work.  Refills for your blood pressure medications have been sent. Also placed referrals to podiatry (foot doctor) and psychiatry I have also sent in a medication for your erectile dysfunction, take this as needed

## 2020-12-06 NOTE — Assessment & Plan Note (Signed)
See details under alcohol use disorder.  Encouraged cessation of cocaine use.

## 2020-12-06 NOTE — Assessment & Plan Note (Signed)
Vitals:   12/06/20 1329  BP: (!) 141/92  Pulse: 81  Temp: 98 F (36.7 C)  SpO2: 100%   Patient reports he has not been taking his blood pressure medications.  Recommended resuming amlodipine 5 mg and valsartan- hydrochlorothiazide 160-12.5 mg daily.

## 2020-12-06 NOTE — Assessment & Plan Note (Addendum)
Patient endorses about 3 months of erectile dysfunction.  States he cannot remember the last time he had an erection and denies any morning erections as well.  States he has not had sexual intercourse in several months.  States this is happened to him in the past in 2017 or 2018.  He does not remember the underlying cause but does remember that taking Viagra helped.  He does endorse urinary frequency, nocturia and feeling like he is incompletely voiding.  Denies any constipation or diarrhea.  No loss of sensation in the pelvic region.  Assessment/plan: Given his history of schizoaffective disorder and major depression which is currently uncontrolled I do believe his erectile dysfunction has a psychological component.  Also given history of severe alcohol use disorder this may also be contributing.  - Tadalafil 5 mg daily for ED and BPH   Addendum: Patient's insurance denied tadalafil.  Patient needs to fail preferred therapies first.  Will treat erectile dysfunction with sildenafil as needed

## 2020-12-06 NOTE — Assessment & Plan Note (Signed)
Will check a hemoglobin A1c today.  Does endorse some bilateral foot burning sensation of both plantar surfaces.

## 2020-12-06 NOTE — Assessment & Plan Note (Signed)
Patient reports he was recently hospitalized for suicidal ideations and his psychiatric illnesses.  He states he continues to feel depressed mostly with a low motivation and decreased pleasure.  He has a PHQ-9 score of 15 today.  Denies any suicidal thoughts ideations or plans.  He states he is seen many doctors and psychiatrist in the past and does not feel like this has helped him.  He states that he has been on many different medications for his schizoaffective disorder and depression but cannot recall the names of these medications.  His current regimen he feels does help him but unfortunately his depression still seems uncontrolled.  Per chart review I am unable to see what medications were previously prescribed.  Recommended patient to follow-up with Dr. Carolynne Edouard as well as referral to psychiatry.  Patient is agreeable to this plan.  -Continue risperidone 0.5 mg twice daily, trazodone 50 mg and hydroxyzine 25 mg as needed. -Referral to psychiatry -Patient to follow-up with Dr. Theodis Shove

## 2020-12-06 NOTE — Assessment & Plan Note (Addendum)
Patient endorses bilateral foot pain described as a burning sensation on the plantar surfaces of his feet.  He also endorses multiple calluses which he has had since he was a child.  He states that he shaves these off himself.  Previously saw foot doctor for this.  Denies any difficulty walking.  On exam he had dry skin, warm to touch, pulses are strong and intact and sensation also intact.  He had several calluses on the plantar surfaces of his feet.  Assessment/plan: Differential diagnosis of his peripheral neuropathy includes alcohol induced versus diabetes versus B12 deficiency.  -Follow-up hemoglobin A1c and vitamin B12 -Referral to podiatry  ADDENDUM: Hemoglobin A1c 5.8 and normal vitamin B12 levels

## 2020-12-07 ENCOUNTER — Telehealth: Payer: Self-pay

## 2020-12-07 LAB — VITAMIN B12: Vitamin B-12: 402 pg/mL (ref 232–1245)

## 2020-12-07 LAB — PSA: Prostate Specific Ag, Serum: 1.6 ng/mL (ref 0.0–4.0)

## 2020-12-07 NOTE — Telephone Encounter (Signed)
Requesting PA on tadalafil (CIALIS) 5 MG tablet.

## 2020-12-11 ENCOUNTER — Telehealth: Payer: Self-pay | Admitting: *Deleted

## 2020-12-11 NOTE — Progress Notes (Signed)
Internal Medicine Clinic Attending ° °Case discussed with Dr. Rehman  At the time of the visit.  We reviewed the resident’s history and exam and pertinent patient test results.  I agree with the assessment, diagnosis, and plan of care documented in the resident’s note.  ° °

## 2020-12-11 NOTE — Telephone Encounter (Signed)
Information was sent through [employer] or PA for Tadalafil.  Denied.  Patient will need to try other meds prior to approval.  Formulary list  was printed and given to Dr Laural Golden to review and change to a Formulary medication if appropriate. Sander Nephew, RN 12/11/2020 10:15 AM.

## 2020-12-11 NOTE — Telephone Encounter (Signed)
Information was sent to [employer].  Denied.  Dr. Laural Golden was given list of covered alternatives.

## 2020-12-12 MED ORDER — SILDENAFIL CITRATE 50 MG PO TABS
50.0000 mg | ORAL_TABLET | ORAL | 1 refills | Status: DC | PRN
Start: 2020-12-12 — End: 2020-12-14

## 2020-12-12 MED ORDER — TAMSULOSIN HCL 0.4 MG PO CAPS
0.4000 mg | ORAL_CAPSULE | Freq: Every day | ORAL | 1 refills | Status: AC
Start: 2020-12-12 — End: ?

## 2020-12-12 NOTE — Telephone Encounter (Signed)
Left message for patient regarding insurance denial of tadalafil medication.  Sent in 2 different medications for BPH and erectile dysfunction.  Patient to take Flomax daily for BPH and Viagra for erectile dysfunction as needed.  Left message for patient, wanting to discuss risks of taking these medications together.

## 2020-12-12 NOTE — Addendum Note (Signed)
Addended by: Mike Craze on: 12/12/2020 02:21 PM   Modules accepted: Orders

## 2020-12-13 ENCOUNTER — Telehealth: Payer: Self-pay | Admitting: Licensed Clinical Social Worker

## 2020-12-13 ENCOUNTER — Telehealth: Payer: Medicaid Other | Admitting: Licensed Clinical Social Worker

## 2020-12-13 NOTE — Telephone Encounter (Signed)
  Care Management   Follow Up Note   12/13/2020 Name: Ethan [name] MRN: [id number] DOB: 1965-10-29   Referred by: Riesa Pope, MD Reason for referral : No chief complaint on file.   An unsuccessful telephone outreach was attempted today. The patient was referred to the case management team for assistance with care management and care coordination.   Follow Up Plan: The care management team will reach out to the patient again over the next 30 days.   Milus Height, [place]  Social Worker [facility] Care Management  [phone]

## 2020-12-14 ENCOUNTER — Other Ambulatory Visit: Payer: Self-pay | Admitting: Internal Medicine

## 2020-12-14 MED ORDER — SILDENAFIL CITRATE 20 MG PO TABS: 40.0000 mg | ORAL_TABLET | Freq: Every day | ORAL | 1 refills | Status: DC | PRN

## 2020-12-14 NOTE — Telephone Encounter (Signed)
Received TC from patient who states he had a message from Dr. Jaynie Collins him to return his call, that she wanted to speak to him regarding his medication (see below).  Will forward to blue team SChaplin, RN,BSN

## 2020-12-14 NOTE — Addendum Note (Signed)
Addended by: Buddy Duty on: 12/14/2020 10:03 AM   Modules accepted: Orders

## 2020-12-25 ENCOUNTER — Telehealth: Payer: Self-pay

## 2020-12-25 NOTE — Telephone Encounter (Signed)
Per pt risperiDONE (RISPERDAL) 0.5 MG tablet is not working. Requesting to speak with a nurse. Please call pt back.

## 2020-12-25 NOTE — Telephone Encounter (Signed)
Returned call to patient People telling him risperidone not working States he's been all the over the place and in mental institutions in the past Does not feel like he wants to harm himself, but wants to "feel different"  [facility] does not prescribe the risperidone, but he was told to follow up with [place].  Will forward info to DrWinstead for advice.Despina Hidden Cassady9/12/202211:53 AM

## 2020-12-25 NOTE — Telephone Encounter (Signed)
Spent 30 min w/Pt on telehealth today addressing current issues Pt has w/upcoming Lease termination/relational issues w/Guardian.  Will f/u with SW & w/Pt again after more info gathered.

## 2021-01-11 ENCOUNTER — Telehealth: Payer: Self-pay | Admitting: Behavioral Health

## 2021-01-11 ENCOUNTER — Ambulatory Visit: Payer: Medicaid Other | Admitting: Behavioral Health

## 2021-01-11 NOTE — Telephone Encounter (Signed)
Contacted Pt for f/u [facility] telehealth visit today. Pt is sick per Guardian Yvette's report.  Guardian c/o runny nose & sore throat. Asked if any possibility of exposure to COVID-19. Guardian replied in the neg.  Encouraged self-care for Guardian & care for Pt. I will r/s him. Guardian agreed.  Dr. Theodis Shove

## 2021-01-21 ENCOUNTER — Other Ambulatory Visit: Payer: Self-pay | Admitting: Internal Medicine

## 2021-01-29 ENCOUNTER — Ambulatory Visit (INDEPENDENT_AMBULATORY_CARE_PROVIDER_SITE_OTHER): Payer: Medicaid Other | Admitting: Podiatry

## 2021-01-29 ENCOUNTER — Encounter: Payer: Self-pay | Admitting: Podiatry

## 2021-01-29 ENCOUNTER — Other Ambulatory Visit: Payer: Self-pay

## 2021-01-29 DIAGNOSIS — L84 Corns and callosities: Secondary | ICD-10-CM

## 2021-01-29 DIAGNOSIS — B353 Tinea pedis: Secondary | ICD-10-CM | POA: Diagnosis not present

## 2021-01-29 MED ORDER — KETOCONAZOLE 2 % EX CREA: 1.0000 "application " | TOPICAL_CREAM | Freq: Every day | CUTANEOUS | 2 refills | Status: DC

## 2021-01-29 NOTE — Progress Notes (Signed)
  Subjective:  Patient ID: Ethan [name], male    DOB: December 18, 1965,   MRN: [id number]  Chief Complaint  Patient presents with   Foot Pain    Bilateral feet - multiple callused areas, sore when walking or shoes, tried many lotions/creams and trimming himself-no help   New Patient (Initial Visit)    55 y.o. male presents for concern of calluses bilateral feet that are difficult for him to trim. Has tried lotions and creams with no relief. Relates burning and tinging in his feet. States he also will have itchiness as well. Patient is pre-diabetic.  [name] Denies any other pedal complaints. Denies n/v/f/c.   PCP Vasilios Katsadouros MD   Past Medical History:  Diagnosis Date   Alcoholism ([place])    Anxiety    Depression    GERD (gastroesophageal reflux disease)    Heart murmur    Hepatitis C    Treated in prison for 18 months.  Sounds like Ribaviron and Interferon  2009-2011   Hypertension    Peptic ulcer    Substance abuse ([place])    Urge urinary incontinence    07/20/2015:  evaluated by Dr. Kathie Rhodes, Urodynamics planned    Objective:  Physical Exam: Vascular: DP/PT pulses 2/4 bilateral. CFT <3 seconds. Normal hair growth on digits. No edema.  Skin. No lacerations or abrasions bilateral feet. Hyperkeratotic tissue noted to bilateral plantar hallux, bilatearl 5th metatarsal heads, second metatarsal heads and lateral heels. Scaling noted ot bilateral plantar feet as well as in webspaced.  Musculoskeletal: MMT 5/5 bilateral lower extremities in DF, PF, Inversion and Eversion. Deceased ROM in DF of ankle joint.  Neurological: Sensation intact to light touch.   Assessment:   1. Corns and callus   2. Tinea pedis of both feet      Plan:  Patient was evaluated and treated and all questions answered. -Discussed corns and calluses with patient and treatment options.  -Hyperkeratotic tissue was debrided with chisel without incident.   -Encouraged daily moisturizing -Discussed use of  pumice stone -Advised good supportive shoes and inserts -Prescription ketoconazole provided for tinea.  -Patient to return to office as needed or sooner if condition worsens.   Lorenda Peck, DPM

## 2021-01-30 ENCOUNTER — Ambulatory Visit: Payer: Medicaid Other | Admitting: Behavioral Health

## 2021-01-30 ENCOUNTER — Telehealth: Payer: Self-pay | Admitting: Behavioral Health

## 2021-01-30 NOTE — Telephone Encounter (Signed)
Lft msg on Pt's mobile phone for today's [facility] telehealth session @ 2:00pm. Suggested Pt r/s @ his convenience.  Dr. Theodis Shove

## 2021-02-18 ENCOUNTER — Other Ambulatory Visit: Payer: Self-pay | Admitting: Internal Medicine

## 2021-03-19 ENCOUNTER — Other Ambulatory Visit: Payer: Self-pay | Admitting: Student

## 2021-03-26 IMAGING — US US ABDOMEN LIMITED
1 series · 13 of 25 positions shown · non-contrast
Comparison: Report from the abdominal radiographs of 07/12/1998
(images unavailable).

CLINICAL DATA: Weight loss, unintentional. Additional history
provided by scanning technologist: Elevated LFTs, history of
hepatitis C with completed treatment.

EXAM:
ULTRASOUND ABDOMEN LIMITED RIGHT UPPER QUADRANT

[Series 1: us abdomen limited · 0.25mm/px · 13 of 40 slices shown]
[im 1/40]
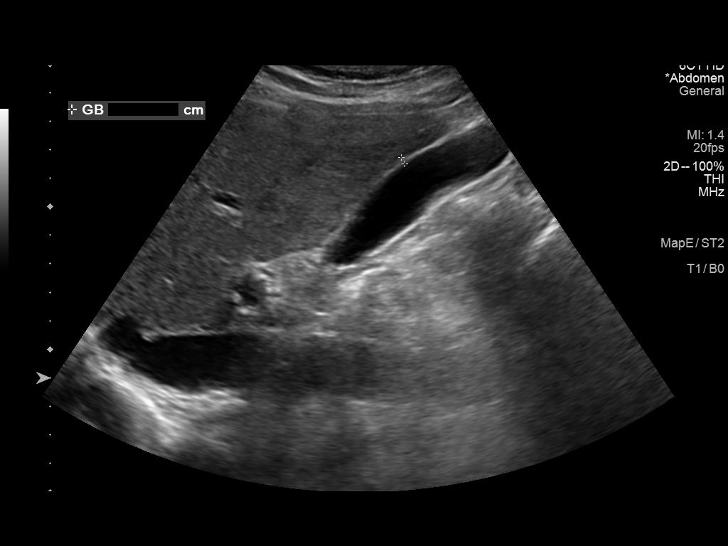
[im 4/40]
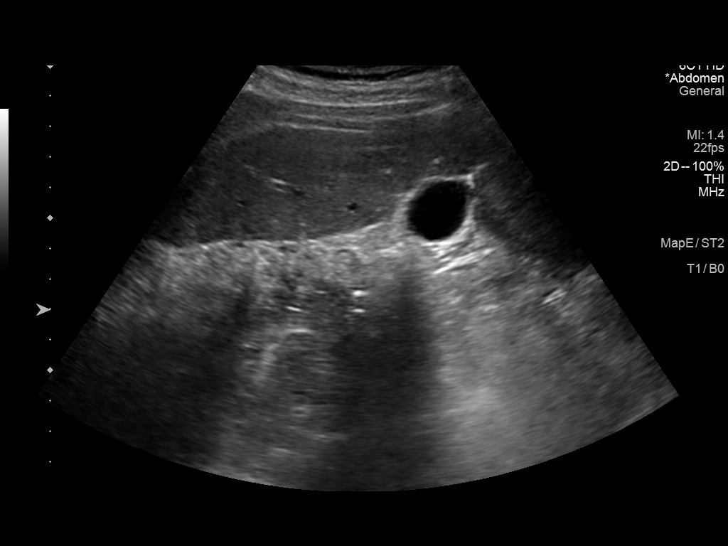
[im 7/40]
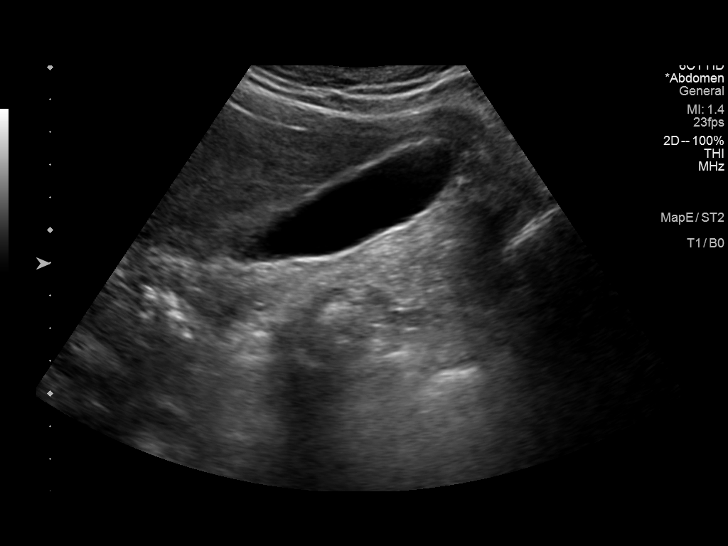
[im 10/40]
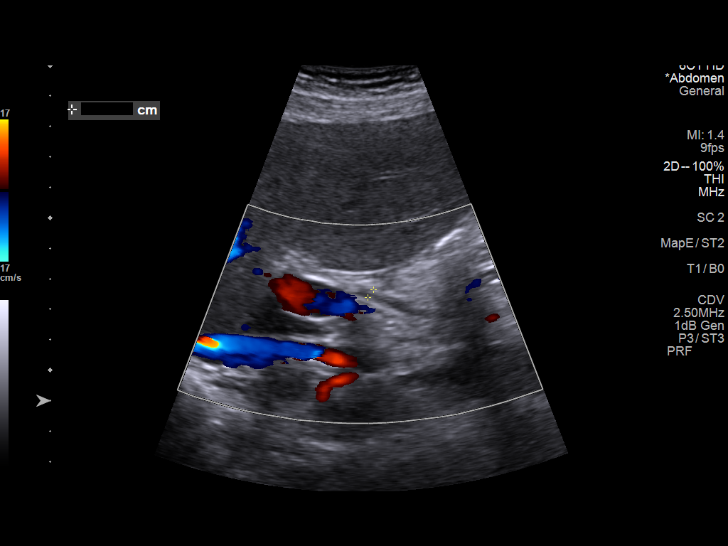
[im 14/40]
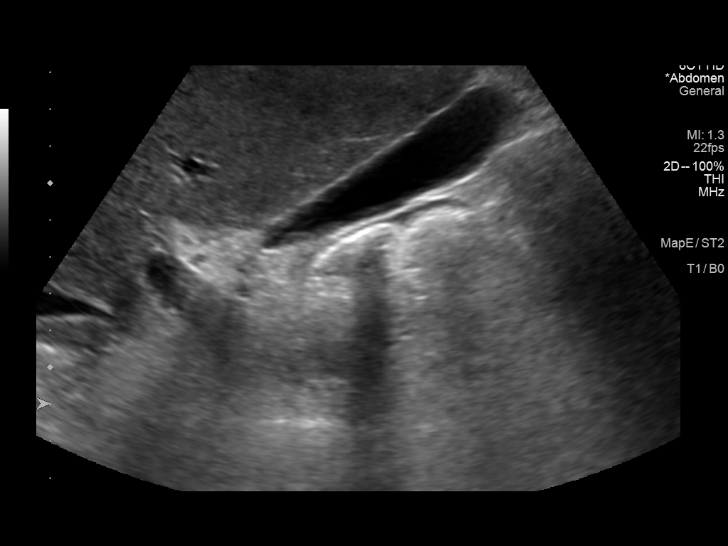
[im 17/40]
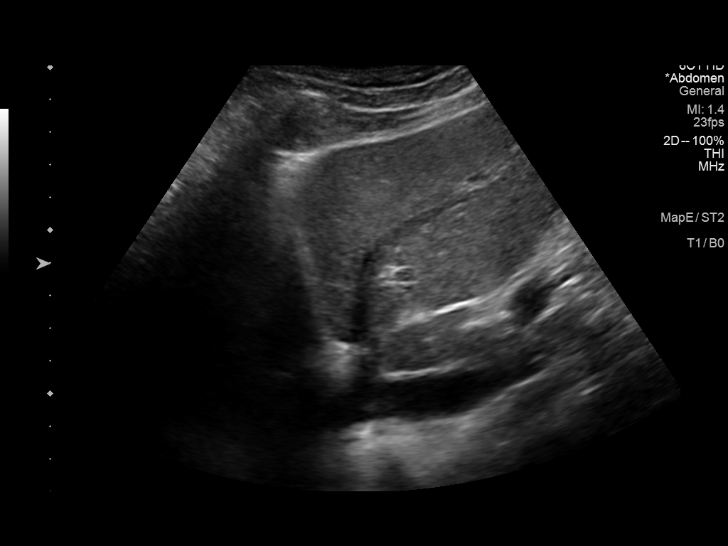
[im 20/40]
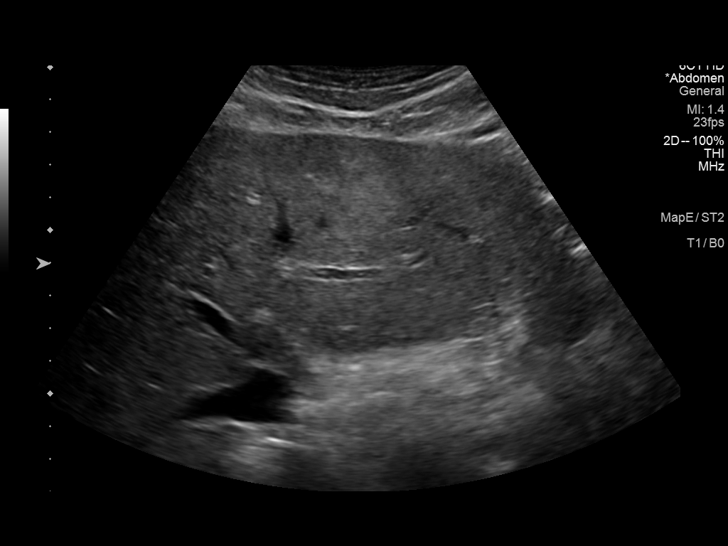
[im 23/40]
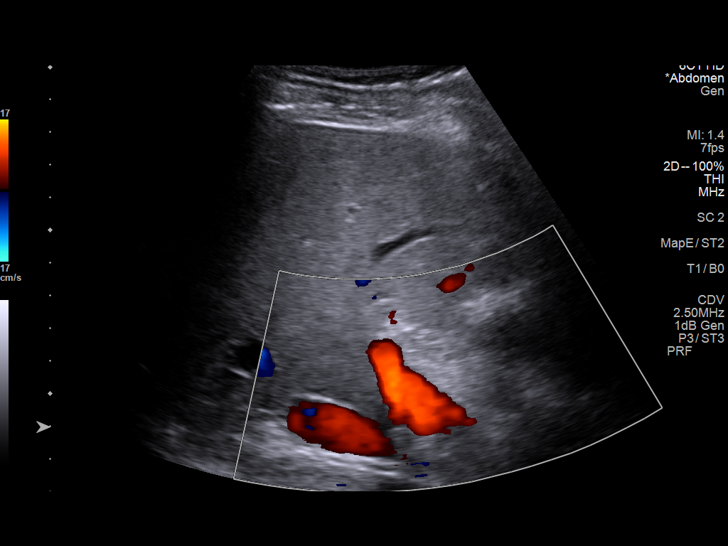
[im 27/40]
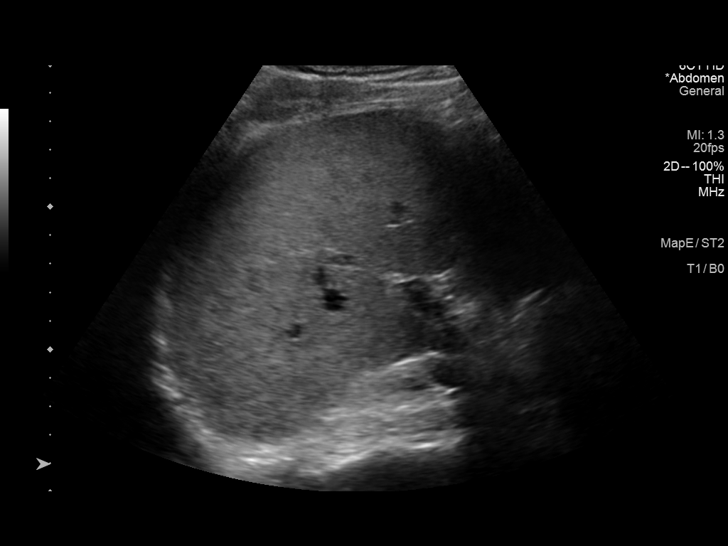
[im 30/40]
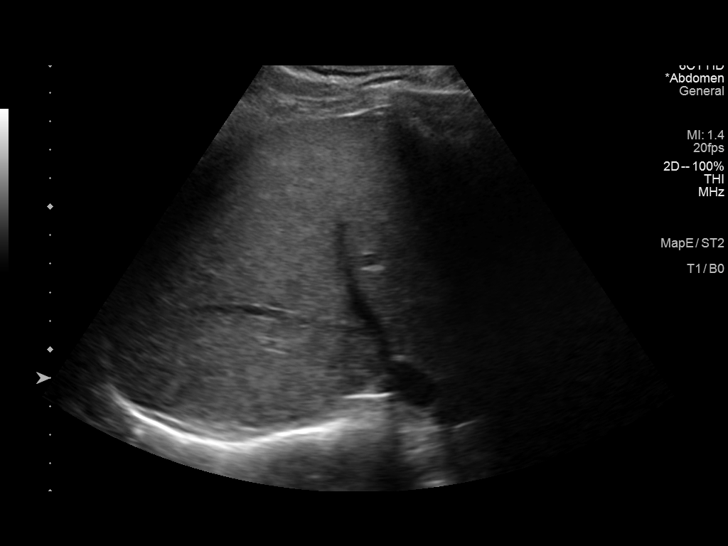
[im 33/40]
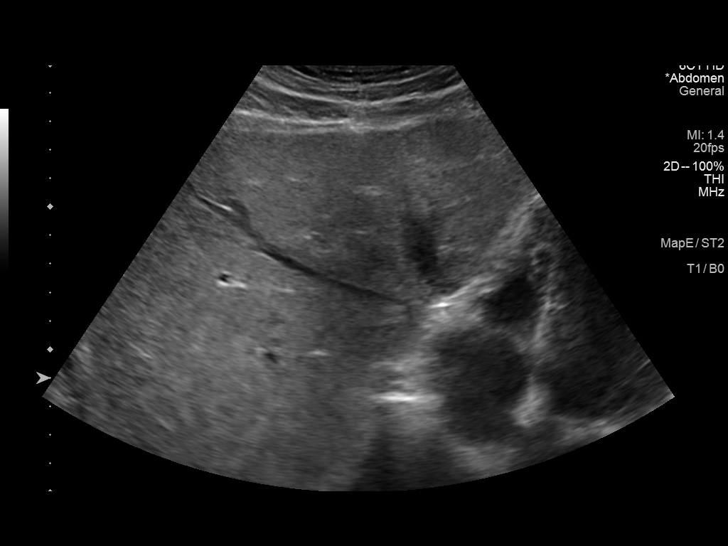
[im 36/40]
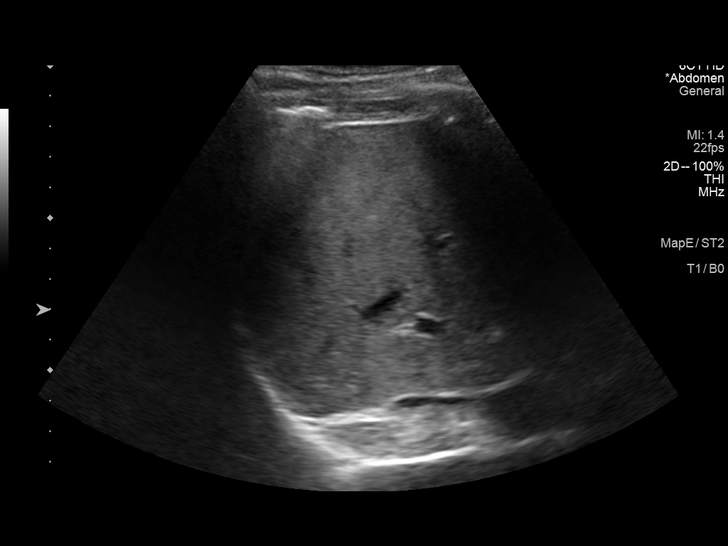
[im 40/40]
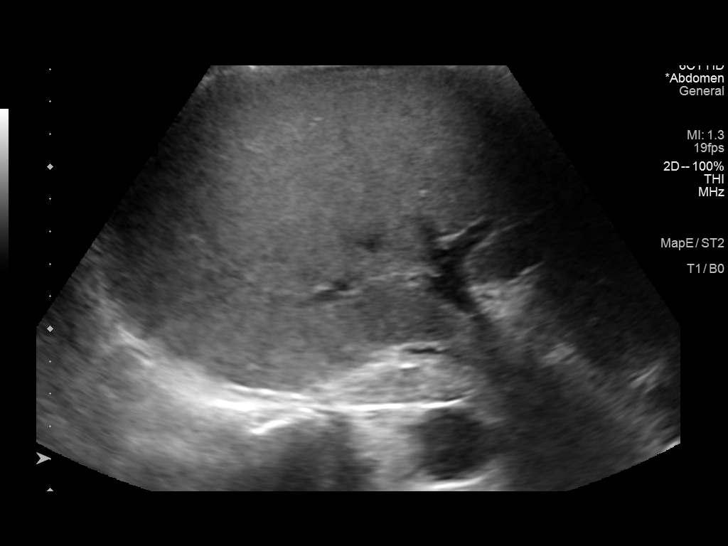

[13 of 25 positions shown; findings below may reference images not displayed]

FINDINGS: Gallbladder:

No gallstones or wall thickening visualized. No sonographic Murphy
sign noted by sonographer.

Common bile duct:

Diameter: 3-4 mm, within normal limits

Liver:

No focal lesion identified. Mild general increased hepatic
parenchymal echogenicity. Portal vein is patent on color Doppler
imaging with normal direction of blood flow towards the liver.
IMPRESSION: Mild hyperechogenicity of the hepatic parenchyma. This is a
nonspecific finding, which may be seen in the setting of hepatic
steatosis or other chronic hepatic parenchymal disease.

No focal liver lesion is identified. Note is made of a history of
hepatitis-C. Consider contrast-enhanced liver protocol abdominal MRI
for hepatocellular carcinoma surveillance given the limited
sensitivity of ultrasound for this indication.

Otherwise unremarkable right upper quadrant ultrasound as described.

## 2021-03-27 ENCOUNTER — Other Ambulatory Visit: Payer: Self-pay | Admitting: Internal Medicine

## 2021-03-27 NOTE — Telephone Encounter (Signed)
Last appointment was 12/06/2020. No pending appointments.

## 2021-03-30 ENCOUNTER — Telehealth: Payer: Medicaid Other | Admitting: Licensed Clinical Social Worker

## 2021-05-26 ENCOUNTER — Other Ambulatory Visit: Payer: Self-pay | Admitting: Student

## 2021-05-28 ENCOUNTER — Other Ambulatory Visit: Payer: Self-pay | Admitting: Student

## 2021-05-30 NOTE — Telephone Encounter (Signed)
Appointment scheduled for 07/02/2021 at 1:15 pm and mailed to patient.

## 2021-07-02 ENCOUNTER — Encounter: Payer: Medicaid Other | Admitting: Student

## 2021-07-02 ENCOUNTER — Encounter: Payer: Self-pay | Admitting: Student

## 2021-07-02 ENCOUNTER — Telehealth: Payer: Self-pay | Admitting: *Deleted

## 2021-07-02 NOTE — Telephone Encounter (Signed)
Called patient regarding his missed appointment/ lvm  for patient to return call to the main number [phone] to reschedule this appointment. ?

## 2021-08-02 ENCOUNTER — Other Ambulatory Visit: Payer: Self-pay

## 2021-08-16 ENCOUNTER — Encounter: Payer: Medicaid Other | Admitting: Student

## 2021-08-23 ENCOUNTER — Encounter: Payer: Medicaid Other | Admitting: Student

## 2021-08-23 ENCOUNTER — Telehealth: Payer: Self-pay | Admitting: *Deleted

## 2021-08-23 NOTE — Chronic Care Management (AMB) (Signed)
  Care Coordination Note  08/23/2021 Name: [name] MRN: [id number] DOB: 11/15/1965  Ethan [name] is a 56 y.o. year old male who is a primary care patient of Riesa Pope, MD and is actively engaged with the care management team. I reached out to Waldemar Dickens by phone today to assist with scheduling a follow up visit with the BSW  Follow up plan: Unsuccessful telephone outreach attempt made. A HIPAA compliant phone message was left for the patient providing contact information and requesting a return call.  The care management team will reach out to the patient again over the next 7 days.  If patient returns call to provider office, please advise to call [place]  at [phone].  [place] Management  Direct Dial: [phone]

## 2021-08-27 NOTE — Chronic Care Management (AMB) (Signed)
  Care Coordination Note  08/27/2021 Name: Ethan [name] MRN: [id number] DOB: 1965/11/04  Ethan [name] is a 56 y.o. year old male who is a primary care patient of Riesa Pope, MD and is actively engaged with the care management team. I reached out to Waldemar Dickens by phone today to assist with re-scheduling a follow up visit with the BSW  Follow up plan: Unsuccessful telephone outreach attempt made. A HIPAA compliant phone message was left for the patient providing contact information and requesting a return call.  The care management team will reach out to the patient again over the next 7 days.  If patient returns call to provider office, please advise to call [place] at [phone].  [place] Management  Direct Dial: [phone]

## 2021-08-31 ENCOUNTER — Other Ambulatory Visit: Payer: Self-pay | Admitting: Student

## 2021-09-03 NOTE — Chronic Care Management (AMB) (Signed)
  Care Coordination Note  09/03/2021 Name: Ethan [name] MRN: [id number] DOB: 1965/09/04  Ethan [name] is a 56 y.o. year old male who is a primary care patient of Riesa Pope, MD and is actively engaged with the care management team. I reached out to Waldemar Dickens by phone today to assist with scheduling a follow up visit with the BSW  Follow up plan: A third unsuccessful telephone outreach attempt made. A HIPAA compliant phone message was left for the patient providing contact information and requesting a return call. Unable to make contact on outreach attempts x 3. PCP Riesa Pope, MD notified via routed documentation in medical record. We have been unable to make contact with the patient for follow up. The care management team is available to follow up with the patient after provider conversation with the patient regarding recommendation for care management engagement and subsequent re-referral to the care management team.   [place] Management  Direct Dial: [phone]

## 2021-11-04 ENCOUNTER — Ambulatory Visit (HOSPITAL_COMMUNITY)
Admission: EM | Admit: 2021-11-04 | Discharge: 2021-11-04 | Disposition: A | Discharge status: Home / Self Care | Payer: Medicaid Other | Attending: Psychiatry | Admitting: Psychiatry

## 2021-11-04 DIAGNOSIS — F329 Major depressive disorder, single episode, unspecified: Secondary | ICD-10-CM | POA: Insufficient documentation

## 2021-11-04 DIAGNOSIS — Z5900 Homelessness unspecified: Secondary | ICD-10-CM | POA: Insufficient documentation

## 2021-11-04 DIAGNOSIS — F1410 Cocaine abuse, uncomplicated: Secondary | ICD-10-CM | POA: Insufficient documentation

## 2021-11-04 NOTE — Discharge Instructions (Signed)
Take all medications as prescribed. Keep all follow-up appointments as scheduled.  Do not consume alcohol or use illegal drugs while on prescription medications. Report any adverse effects from your medications to your primary care provider promptly.  In the event of recurrent symptoms or worsening symptoms, call 911, a crisis hotline, or go to the nearest emergency department for evaluation.   

## 2021-11-04 NOTE — ED Notes (Signed)
Patient was discharged by the provider and given community resources.

## 2021-11-04 NOTE — ED Provider Notes (Signed)
Behavioral Health Urgent Care Medical Screening Exam  Patient Name: Ethan [name] MRN: [id number] Date of Evaluation: 11/04/21 Chief Complaint:   Diagnosis:  Final diagnoses:  Cocaine abuse ([place])    History of Present illness: Ethan [name] is a 55 y.o. male.  Presents to [facility] urgent care seeking detox from cocaine abuse.  States he was advised by Sharyn Lull at [facility] to follow-up for detox and medical clearance.  He denied suicidal or homicidal ideations.  Denies auditory visual hallucinations.  States he contacted [facility] facility 2 weeks ago however was taking care of a cat so he did not come in at that time. States he really needs help getting off of " crack cocaine" he reports detoxing here before and was hopeful to transition to [facility] again.  Patient advised to follow back up with [facility].  He reports he gave up his house and now is currently homeless. Stated he last used yesterday night.   Patient has a charted history with cocaine use dependency, major depressive disorder, alcohol and substance use.  Denied that he is followed by therapy and/or psychiatry.   Ethan [name] is sitting  in no acute distress. He is alert/oriented x 4; calm/cooperative; and mood congruent with affect. he is speaking in a clear tone at moderate volume, and normal pace; with good eye contact.his thought process is coherent and relevant; There is no indication that he is currently responding to internal/external stimuli or experiencing delusional thought content; and he has denied suicidal/self-harm/homicidal ideation, psychosis, and paranoia.   Patient has remained calm throughout assessment and has answered questions appropriately.    At this time Ethan [name] is educated and verbalizes understanding of mental health resources and other crisis services in the community. he is instructed to call 911 and present to the nearest emergency room should he experience any suicidal/homicidal  ideation, auditory/visual/hallucinations, or detrimental worsening of his mental health condition.He was a also advised by Probation officer that he could call the toll-free phone on insurance card to assist with identifying in network counselors and agencies or number on back of Medicaid card t speak with care coordinator    Psychiatric Specialty Exam  Presentation  General Appearance:Appropriate for Environment  Eye Contact:Good  Speech:Clear and Coherent  Speech Volume:Normal  Handedness:Right   Mood and Affect  Mood:Anxious  Affect:Congruent   Thought Process  Thought Processes:Coherent  Descriptions of Associations:Intact  Orientation:Full (Time, Place and Person)  Thought Content:Logical  Diagnosis of Schizophrenia or Schizoaffective disorder in past: No data recorded Duration of Psychotic Symptoms: No data recorded Hallucinations:None  Ideas of Reference:None  Suicidal Thoughts:No  Homicidal Thoughts:No   Sensorium  Memory:Recent Good; Immediate Good  Judgment:Good  Insight:Good   Executive Functions  Concentration:Fair  Attention Span:Good  [place]   Psychomotor Activity  Psychomotor Activity:No data recorded  Assets  Assets:Housing; Desire for Improvement; Social Support   Sleep  Sleep:Fair  Number of hours: No data recorded  Nutritional Assessment (For OBS and FBC admissions only) Has the patient had a weight loss or gain of 10 pounds or more in the last 3 months?: No Has the patient had a decrease in food intake/or appetite?: No Does the patient have dental problems?: No Does the patient have eating habits or behaviors that may be indicators of an eating disorder including binging or inducing vomiting?: No Has the patient recently lost weight without trying?: 0 Has the patient been eating poorly because of a decreased appetite?: 0  Malnutrition Screening Tool Score: 0    Physical  Exam: Physical Exam Vitals and nursing note reviewed.  Cardiovascular:     Rate and Rhythm: Normal rate and regular rhythm.  Pulmonary:     Effort: Pulmonary effort is normal.  Neurological:     Mental Status: He is alert and oriented to person, place, and time.  Psychiatric:        Mood and Affect: Mood normal.        Behavior: Behavior normal.        Thought Content: Thought content normal.    Review of Systems  HENT: Negative.    Eyes: Negative.   Cardiovascular: Negative.  Negative for chest pain.  Genitourinary: Negative.   Musculoskeletal: Negative.   Skin: Negative.   Psychiatric/Behavioral:  Positive for depression and substance abuse. The patient is nervous/anxious.   All other systems reviewed and are negative.  Blood pressure (!) 148/97, pulse 76, temperature 97.8 F (36.6 C), temperature source Oral, resp. rate 18, height '5\' 8"'$  (1.727 m), weight 170 lb (77.1 kg), SpO2 99 %. Body mass index is 25.85 kg/m.  Musculoskeletal: Strength & Muscle Tone: within normal limits Gait & Station: normal Patient leans: N/A   [place] [facility] Discharge Disposition for Follow up and Recommendations: Based on my evaluation the patient does not appear to have an emergency medical condition and can be discharged with resources and follow up care in outpatient services for Medication Management and Substance Abuse Intensive Outpatient Program   [place], NP 11/04/2021, 11:45 AM

## 2021-11-04 NOTE — Progress Notes (Signed)
Patient is a 56 year old male that presents voluntary as a walk in to [facility] requesting assistance with ongoing SA issues. Patient denies any S/I, H/I or AVH. Patient states he has been using cocaine "forever" and is vague in reference to amounts and time frames. Patient does report using "a gram or so" in the last 24 hours. Patient states he has a history of "mental issues" although renders limited history in reference to current diagnosis. Patient denies having a current OP provider or any medications. Patient states he is "here for a cocaine detox" this Probation officer and provider evaluated patient as patient denied any S/I, H/I or AVH. Patient was explained that there was no detox services required for cocaine and educated on his current substance of use and provided information/resources on where he can follow up for ongoing assistance.

## 2021-11-09 ENCOUNTER — Encounter (HOSPITAL_COMMUNITY): Payer: Self-pay | Admitting: Emergency Medicine

## 2021-11-09 ENCOUNTER — Emergency Department (HOSPITAL_COMMUNITY)
Admission: EM | Admit: 2021-11-09 | Discharge: 2021-11-09 | Disposition: A | Discharge status: Home / Self Care | Payer: Medicaid Other | Attending: Emergency Medicine | Admitting: Emergency Medicine

## 2021-11-09 ENCOUNTER — Other Ambulatory Visit: Payer: Self-pay

## 2021-11-09 DIAGNOSIS — Z79899 Other long term (current) drug therapy: Secondary | ICD-10-CM | POA: Insufficient documentation

## 2021-11-09 DIAGNOSIS — F1510 Other stimulant abuse, uncomplicated: Secondary | ICD-10-CM | POA: Diagnosis not present

## 2021-11-09 DIAGNOSIS — F1910 Other psychoactive substance abuse, uncomplicated: Secondary | ICD-10-CM | POA: Insufficient documentation

## 2021-11-09 DIAGNOSIS — F1410 Cocaine abuse, uncomplicated: Secondary | ICD-10-CM | POA: Diagnosis present

## 2021-11-09 LAB — RAPID URINE DRUG SCREEN, HOSP PERFORMED
Amphetamines: POSITIVE — AB
Barbiturates: NOT DETECTED
Benzodiazepines: NOT DETECTED
Cocaine: POSITIVE — AB
Opiates: NOT DETECTED
Tetrahydrocannabinol: POSITIVE — AB

## 2021-11-09 LAB — CBC WITH DIFFERENTIAL/PLATELET
Abs Immature Granulocytes: 0.11 10*3/uL — ABNORMAL HIGH (ref 0.00–0.07)
Basophils Absolute: 0 10*3/uL (ref 0.0–0.1)
Basophils Relative: 0 %
Eosinophils Absolute: 0.2 10*3/uL (ref 0.0–0.5)
Eosinophils Relative: 1 %
HCT: 44 % (ref 39.0–52.0)
Hemoglobin: 14.6 g/dL (ref 13.0–17.0)
Immature Granulocytes: 1 %
Lymphocytes Relative: 7 %
Lymphs Abs: 1.2 10*3/uL (ref 0.7–4.0)
MCH: 28.9 pg (ref 26.0–34.0)
MCHC: 33.2 g/dL (ref 30.0–36.0)
MCV: 87 fL (ref 80.0–100.0)
Monocytes Absolute: 0.7 10*3/uL (ref 0.1–1.0)
Monocytes Relative: 4 %
Neutro Abs: 16 10*3/uL — ABNORMAL HIGH (ref 1.7–7.7)
Neutrophils Relative %: 87 %
Platelets: 257 10*3/uL (ref 150–400)
RBC: 5.06 MIL/uL (ref 4.22–5.81)
RDW: 13.7 % (ref 11.5–15.5)
WBC: 18.3 10*3/uL — ABNORMAL HIGH (ref 4.0–10.5)
nRBC: 0 % (ref 0.0–0.2)

## 2021-11-09 LAB — COMPREHENSIVE METABOLIC PANEL
ALT: 19 U/L (ref 0–44)
AST: 29 U/L (ref 15–41)
Albumin: 4.2 g/dL (ref 3.5–5.0)
Alkaline Phosphatase: 71 U/L (ref 38–126)
Anion gap: 9 (ref 5–15)
BUN: 9 mg/dL (ref 6–20)
CO2: 21 mmol/L — ABNORMAL LOW (ref 22–32)
Calcium: 9.5 mg/dL (ref 8.9–10.3)
Chloride: 106 mmol/L (ref 98–111)
Creatinine, Ser: 1.15 mg/dL (ref 0.61–1.24)
GFR, Estimated: >60 mL/min (ref >60)
Glucose, Bld: 109 mg/dL — ABNORMAL HIGH (ref 70–99)
Potassium: 3.8 mmol/L (ref 3.5–5.1)
Sodium: 136 mmol/L (ref 135–145)
Total Bilirubin: 1.4 mg/dL — ABNORMAL HIGH (ref 0.3–1.2)
Total Protein: 7.3 g/dL (ref 6.5–8.1)

## 2021-11-09 LAB — ETHANOL: Alcohol, Ethyl (B): <10 mg/dL (ref <10)

## 2021-11-09 MED ORDER — LORATADINE 10 MG PO TABS
10.0000 mg | ORAL_TABLET | Freq: Once | ORAL | Status: AC
  Administered 2021-11-09: 10 mg via ORAL
  Filled 2021-11-09: qty 1

## 2021-11-09 NOTE — Progress Notes (Signed)
CSW spoke with patient who stated he spoke to Hodge at [facility] in [facility] [phone]. Patient stated her extension is [phone]. Patient stated that [facility] said they could take him today. CSW left a message with Sharyn Lull to confirm prior to giving patient a cab voucher to [facility].

## 2021-11-09 NOTE — ED Notes (Addendum)
Patient arguing with provider during discharge and elevating voice during conversation about discharge also using profanities and obscenities. Patient storms out of the room because he reports that he is going to the restroom. Safety officers at bedside.

## 2021-11-09 NOTE — Discharge Instructions (Signed)
Try to avoid using illegal drugs and alcohol.  For itching take Benadryl 4 times a day.  Follow-up at [facility] for further treatment next week as planned.

## 2021-11-09 NOTE — ED Triage Notes (Addendum)
Patient here with assistance with placement for inpatient ETOH and drug rehab. Patient states drinks 3-4 40oz beers per day, last drink yesterday. Patient states he smokes cannabis regularly but tried methamphetamine for the first time yesterday hoping it would "explode my heart and I'd die since nobody seems to care anyway". Patient is alert, oriented, and in no apparent distress at this time.

## 2021-11-09 NOTE — ED Notes (Signed)
Pt left the room and came this writer's desk and says "yall don't need anything from me, do you?". I informed the patient that he was up for discharge and that paperwork was pending. However, the patient walked out to the ED lobby and said that he wanted the paperwork mailed to him.

## 2021-11-09 NOTE — ED Provider Notes (Signed)
[facility] EMERGENCY DEPARTMENT Provider Note   CSN: [id number] Arrival date & time: 11/09/21  1448     History  Chief Complaint  Patient presents with   Detox    ABDULWAHAB [name] is a 56 y.o. male.  HPI Patient here for request of detox from alcohol, marijuana, and cocaine.  He states he has previously tried to do that at [facility] but was unable to get treatment there satisfied him.  He requests that records be sent to [facility] to help get him in there.  He states that he lives in an environment where it is difficult to avoid drugs.  Yesterday he used methamphetamine, which was the first time.  He denies shortness of breath, fever, chills, chest pain, weakness or dizziness.  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    Home Medications Prior to Admission medications   Medication Sig Start Date End Date Taking? Authorizing Provider  amLODipine (NORVASC) 5 MG tablet Take 1 tablet (5 mg total) by mouth daily. 12/06/20   Rehman, Areeg N, DO  hydrOXYzine (ATARAX/VISTARIL) 25 MG tablet Take 1 tablet (25 mg total) by mouth every 6 hours as needed for anxiety. 10/24/20   Arnaldo Natal, MD  ketoconazole (NIZORAL) 2 % cream Apply 1 application topically daily. 01/29/21   Lorenda Peck, DPM  Multiple Vitamin (MULTIVITAMIN WITH MINERALS) TABS tablet Take 1 tablet by mouth daily. 11/04/20   Armando Reichert, MD  nicotine (NICODERM CQ - DOSED IN MG/24 HOURS) 21 mg/24hr patch Place 1 patch (21 mg total) onto the skin daily. 11/03/20   Armando Reichert, MD  pantoprazole (PROTONIX) 40 MG tablet TAKE 1 TABLET BY MOUTH EVERY DAY 09/03/21   Riesa Pope, MD  risperiDONE (RISPERDAL) 0.5 MG tablet Take 1 tablet (0.5 mg total) by mouth 2 (two) times daily. 10/24/20   Arnaldo Natal, MD  sildenafil (REVATIO) 20 MG tablet TAKE 2 TABLETS(40 MG) BY MOUTH DAILY AS NEEDED FOR ERECTILE DYSFUNCTION 03/30/21   Riesa Pope, MD  tamsulosin (FLOMAX)  0.4 MG CAPS capsule Take 1 capsule (0.4 mg total) by mouth daily. 12/12/20   Rehman, Areeg N, DO  thiamine 100 MG tablet Take 1 tablet (100 mg total) by mouth daily. 11/04/20   Armando Reichert, MD  traZODone (DESYREL) 50 MG tablet Take 1 tablet (50 mg total) by mouth at bedtime and may repeat dose one time if needed. 10/24/20   Arnaldo Natal, MD  valsartan-hydrochlorothiazide (DIOVAN-HCT) 160-12.5 MG tablet Take 1 tablet by mouth daily. 12/06/20 01/05/21  Rehman, Areeg N, DO      Allergies    Patient has no known allergies.    Review of Systems   Review of Systems  Physical Exam Updated Vital Signs BP (!) 150/82   Pulse 78   Temp 98.3 F (36.8 C) (Oral)   Resp 18   SpO2 98%  Physical Exam Vitals and nursing note reviewed.  Constitutional:      Appearance: He is well-developed. He is not ill-appearing.  HENT:     Head: Normocephalic and atraumatic.     Right Ear: External ear normal.     Left Ear: External ear normal.  Eyes:     Conjunctiva/sclera: Conjunctivae normal.     Pupils: Pupils are equal, round, and reactive to light.  Neck:     Trachea: Phonation normal.  Cardiovascular:     Rate and Rhythm: Normal rate.  Pulmonary:  Effort: Pulmonary effort is normal.  Abdominal:     General: There is no distension.     Palpations: Abdomen is soft.     Tenderness: There is no abdominal tenderness.  Musculoskeletal:        General: Normal range of motion.     Cervical back: Normal range of motion and neck supple.  Skin:    General: Skin is warm and dry.  Neurological:     Mental Status: He is alert and oriented to person, place, and time.     Cranial Nerves: No cranial nerve deficit.     Sensory: No sensory deficit.     Motor: No abnormal muscle tone.     Coordination: Coordination normal.  Psychiatric:        Mood and Affect: Mood normal.        Behavior: Behavior normal.        Thought Content: Thought content normal.        Judgment: Judgment normal.     ED  Results / Procedures / Treatments   Labs (all labs ordered are listed, but only abnormal results are displayed) Labs Reviewed  COMPREHENSIVE METABOLIC PANEL - Abnormal; Notable for the following components:      Result Value   CO2 21 (*)    Glucose, Bld 109 (*)    Total Bilirubin 1.4 (*)    All other components within normal limits  CBC WITH DIFFERENTIAL/PLATELET - Abnormal; Notable for the following components:   WBC 18.3 (*)    Neutro Abs 16.0 (*)    Abs Immature Granulocytes 0.11 (*)    All other components within normal limits  RAPID URINE DRUG SCREEN, HOSP PERFORMED - Abnormal; Notable for the following components:   Cocaine POSITIVE (*)    Amphetamines POSITIVE (*)    Tetrahydrocannabinol POSITIVE (*)    All other components within normal limits  ETHANOL    EKG None  Radiology No results found.  Procedures Procedures    Medications Ordered in ED Medications  loratadine (CLARITIN) tablet 10 mg (10 mg Oral Given 11/09/21 1322)    ED Course/ Medical Decision Making/ A&P Clinical Course as of 11/09/21 1344  Fri Nov 09, 2021  1342 This time the patient is very angry.  Both he and I talked to [facility] and they cannot accommodate him until next Thursday.  Patient is making veiled threats to hurt people.  He denies active suicidal ideation or active homicidal plan.  He is frustrated because he is around drugs that people give him.  He is pruritus was treated with Claritin.  He has discussed his situation with TOC, here.  They gave him discharge resources. [EW]    Clinical Course User Index [EW] Daleen Bo, MD                           Medical Decision Making Patient with polysubstance abuse seeking help to get out of his current cycle of drug use.  He has had previous request for same.  Amount and/or Complexity of Data Reviewed External Data Reviewed: notes.    Details: Patient was evaluated for substance abuse, 5 days ago at   [facility].  He was given these  instructions: At this time Ethan [name] is educated and verbalizes understanding of mental health resources and other crisis services in the community. he is instructed to call 911 and present to the nearest emergency room should he experience any suicidal/homicidal ideation, auditory/visual/hallucinations,  or detrimental worsening of his mental health condition.He was a also advised by Probation officer that he could call the toll-free phone on insurance card to assist with identifying in network counselors and agencies or number on back of Medicaid card t speak with care coordinator Labs: ordered.    Details: CBC, metabolic panel, alcohol level, drug screen-normal except drug screen positive for cocaine, marijuana and amphetamine, white count high  Risk OTC drugs. Decision regarding hospitalization. Risk Details: Patient with polysubstance abuse.  He is frustrated because of inability to get into [facility] today.  He wants to get into a long-term treatment program because he is having trouble voiding using multiple substances or intoxication.  Screening exam is reassuring.  White count elevation is likely multifactorial and not indicative of infection.  He does not require hospitalization or psychiatric intervention at this time.           Final Clinical Impression(s) / ED Diagnoses Final diagnoses:  Polysubstance abuse [facility])    Rx / DC Orders ED Discharge Orders     None         Daleen Bo, MD 11/09/21 1344

## 2021-12-03 ENCOUNTER — Encounter: Payer: Self-pay | Admitting: *Deleted

## 2021-12-03 NOTE — Congregational Nurse Program (Signed)
  Dept: [phone]   Congregational Nurse Program Note  Date of Encounter: 12/03/2021  Past Medical History: Past Medical History:  Diagnosis Date   Alcoholism ([place])    Anxiety    Depression    GERD (gastroesophageal reflux disease)    Heart murmur    Hepatitis C    Treated in prison for 18 months.  Sounds like Ribaviron and Interferon  2009-2011   Hypertension    Peptic ulcer    Substance abuse ([place])    Urge urinary incontinence    07/20/2015:  evaluated by Dr. Kathie Rhodes, [facility] planned    Encounter Details:  CNP Questionnaire - 12/03/21 1120       Questionnaire   Do you give verbal consent to treat you today? Yes    Location Patient Served  [facility]    Visit Setting Church or Organization;Phone/Text/Email    Patient Status Homeless    Insurance [employer] Referral N/A    Medication N/A    Medical Provider Yes    Screening Referrals N/A    Medical Referral Drug/Alcohol Services    Medical Appointment Made N/A    Food N/A    Transportation Need transportation assistance   client to inform writer if help needed with transportation   Housing/Utilities No permanent housing    Interpersonal Safety N/A    Intervention [place];Support    ED Visit Averted N/A    Life-Saving Intervention Made N/A            Client came to nurse's office asking for help with detox from alcohol, cocaine, and marijuana, Client reports he was recently made to leave [facility] and relapsed. Contacted [facility] with client present. They are to check information and contact client back for an assessment. Instructed client to let writer know by 2:00 today if he needs assistance with transportation or on Wednesday.   Janiqua Friscia W RN CN

## 2021-12-04 ENCOUNTER — Ambulatory Visit (HOSPITAL_COMMUNITY)
Admission: EM | Admit: 2021-12-04 | Discharge: 2021-12-04 | Disposition: A | Discharge status: Home / Self Care | Payer: Medicaid Other | Attending: Behavioral Health | Admitting: Behavioral Health

## 2021-12-04 DIAGNOSIS — F4321 Adjustment disorder with depressed mood: Secondary | ICD-10-CM | POA: Diagnosis not present

## 2021-12-04 DIAGNOSIS — H538 Other visual disturbances: Secondary | ICD-10-CM | POA: Insufficient documentation

## 2021-12-04 DIAGNOSIS — H548 Legal blindness, as defined in USA: Secondary | ICD-10-CM | POA: Insufficient documentation

## 2021-12-04 DIAGNOSIS — F1910 Other psychoactive substance abuse, uncomplicated: Secondary | ICD-10-CM | POA: Insufficient documentation

## 2021-12-04 DIAGNOSIS — R45851 Suicidal ideations: Secondary | ICD-10-CM | POA: Insufficient documentation

## 2021-12-04 DIAGNOSIS — Z5900 Homelessness unspecified: Secondary | ICD-10-CM | POA: Insufficient documentation

## 2021-12-04 NOTE — ED Notes (Signed)
Pt refused his outpatient resources

## 2021-12-04 NOTE — BH Assessment (Addendum)
LCSW Progress Note  3009 - LCSW contacted [place], [phone], to inquire about bed availability.  LCSW was informed that [facility] would not be able to take the pt because he is a registered sex offender.  [place] contacted [facility], [id number], to inquire about availability.  LCSW provided pt with the phone number to complete the intake and screening process.  LCSW also provided pt with numbers to [facility] that have vacancies.  Other places contacted where pt was denied services due to being a sex offender were [facility] and [facility]. LCSW was in the process of contacting [facility] [place], [phone], for housing options and a coordinated assessment when pt had discharged and left.  Omelia Blackwater, MSW, [place] phone

## 2021-12-04 NOTE — Discharge Instructions (Addendum)
Discharge recommendations:  Please see information for follow-up appointment with psychiatry, therapy and substance abuse treatment. Please follow up with your primary care provider for all medical related needs.   Therapy: We recommend that patient participate in individual therapy to address mental health concerns.  Safety:  The patient should abstain from use of illicit substances/drugs and abuse of any medications. If symptoms worsen or do not continue to improve or if the patient becomes actively suicidal or homicidal then it is recommended that the patient return to the closest hospital emergency department, the [facility], or call 911 for further evaluation and treatment. National Suicide Prevention Lifeline 1-800-SUICIDE or 9145006883.  About 988 988 offers 24/7 access to trained crisis counselors who can help people experiencing mental health-related distress. People can call or text 988 or chat [email or web address] for themselves or if they are worried about a loved one who may need crisis support.    Substance Abuse Treatment Programs  Intensive Outpatient Programs [facility]     [address], [place]       [facility] [place] [address] [place], [place]  [facility]     (Inpatient and outpatient)     [address]           [place] [phone] (Suboxone and Methadone)  [place], [place] [address]      [place] [address] [place], [place]  [facility] [place] (Outpatient/Inpatient, Chemical)    (insurance only) [phone]             [facility] ([place]) [place], [place]     [facility]     [address]     [place], [place]       [facility] (for caregivers and family) [phone] [address] Kristeen [place]. [place], [place]      Residential Treatment Programs [facility]      [address] [place], [place] [address]  [phone]       [facility] [address] [place], [place] [address] [phone]  [facility] [place]        [phone]       [facility] [place] [phone]  [facility] ([place].)             [place], [place] or [place] of [place] [address] [address] [place], [address] [phone]  [facility] [place]  [address]      [place], [place]       [facility] [place], [place]  [place]   [address] [place], [place] [address]     [phone]      Admissions: 8am-3pm M-F  [facility] ([facility]) [address] [place], [place]  [facility] Program: Residential Program [phone] Days)   [place], [place] or [phone]     [facility]: [place], [place] (Walk in Hours over the weekend or by referral)  [facility] [place], [place], [place] [address] [phone]  Crisis Mobile: [facility]:  [phone] (for crisis response 24 hours a day) [facility] Hotline:      [phone] Outpatient Psychiatry and Counseling  [facility]: Mobile Crisis Management 24 hours:  [phone]  [facility] of the [employer] sliding scale fee and walk in schedule: M-F 8am-12pm/1pm-3pm [place], [place] [address] [address] [place], [place] [address] [phone]  [facility] [employer])- new  patient walk-in appointments available Monday - Friday 8am -3pm.          [address] [place], [place] [address] [phone] or crisis line- [place]/ Intensive Outpatient Therapy Program [place], [place] [address] [place]      [phone] [address] [place], [place] [address]                 [place]   [facility] [phone]. [place], [place] [address]   [employer] of Care          [address] Johnette Abraham  [place], [place] [address]       [phone]  [place], [place], [place] [address] [phone]  [facility]    [address] [place] [employer], [place] [address]     [employer], [place] Joycelyn Man     [place] [address]     [phone]       [facility] [place] [address]  [facility]     [address] [place], [place], [place], [place] [address] [place]     [address]     [employer] [place], [place] [address]     [phone]       [facility] [address] [place], [place] [address] [phone] Resources for Temporary Residential Assistance/Crisis [facility]) M-F 8am-3pm   [address], [place] [address]   [phone] Services include: laundry, barbering, support groups, case management, phone  & computer access, showers, AA/NA mtgs, mental health/substance abuse nurse, job skills class, disability information, VA assistance,  spiritual classes, etc.   [facility]   [address] [place]     [phone]              [employer] (women and children)       [place]. [place], [place] [address] [phone] [email or web address] for application and process Application Required  [facility]   [address]    [place] [address]     [phone]                    [place], [place] [address] [phone]([email or web address] application appt.) Application Required  [facility] (women only)    [address]     [place], [place] [address]     [phone]      Intake starts 6pm daily Need valid ID, SSC, & Police report [employer] [address] [place], [place] [phone] Application Required  [employer] (men only)     [place].      [place], [place]       [place] (Pregnant women only) [address] [place], [place]  [facility]      [place] Dani [address].      [place], [place] [address]     [phone]             [facility] [address] [place], [place] 90 day commitment/SA/Application process  [facility](men only)     [address]     [place], [place]       Check-in at [facility] of [facility] [address] [place], [place] [address] [phone] Men/Women/Women and Children must be there by 7 pm  [place], [place]
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
## 2021-12-04 NOTE — BH Assessment (Signed)
Comprehensive Clinical Assessment (CCA) Note  12/04/2021 Ethan [name] [id number]  Disposition: Per Darrol Angel, patient does not meet inpatient criteria.  Residential SA treatment or halfway house is recommended.  SW to pursue appropriate SA program options.  The patient demonstrates the following risk factors for suicide: Chronic risk factors for suicide include: psychiatric disorder of Schizoaffective Disorder, untreated and substance use disorder. Acute risk factors for suicide include: social withdrawal/isolation and loss (financial, interpersonal, professional). Protective factors for this patient include: positive social support, coping skills, and life satisfaction. Considering these factors, the overall suicide risk at this point appears to be low. Patient is appropriate for outpatient follow up.  Patient is a 56 year old male with a history of Schizoaffective Disorder, depressed type and Alcohol Use Disorder, moderate who presents voluntarily to [facility] for assessment.  Patient reports he was recently kicked out of [facility] due to a disagreement with another patient.  He states this made his depression worse and he tried to overdose on Tylenol after he left [facility].  He states he was in a hotel at the time and "I just slept and woke up later."  He did not seek treatment following this attempt.   Patient endorses SI today, stating he is considering walking out in traffic.  He reports his primary stressor is being homeless since his girlfriend he was living with kicked him out.  He was staying in a hotel, however ran out of disability income for the month today.  He states he cannot be out in the streets due to his vision problems at night.  He states, "All the shelters are full.  I just cannot be out there at night.  It's too scary, I'd rather die."  Patient expresses interest in long term SA treatment.  He states [facility] was planning to connect him with a re-entry program,  however he lost this opportunity when he was dismissed from [facility] residential program.  Patient denies HI and AVH.  He reports he relapsed on ETOH and has been drinking 3 (40 oz) beers daily for the past week.  He has also been using THC daily during this time. Patient was able to affirm his safety if SW could find him a SA treatment program.     Chief Complaint:  Chief Complaint  Patient presents with   Homeless   Depression   Suicidal   Visit Diagnosis: Schizoaffective Disorder, untreated                             Alcohol Use Disorder, moderate   Flowsheet Row ED from 12/04/2021 in [facility] Office Visit from 12/06/2020 in [facility] ED from 10/22/2020 in [facility] DEPT  Thoughts that you would be better off dead, or of hurting yourself in some way Several days More than half the days More than half the days  PHQ-9 Total Score '8 15 20      '$ Flowsheet Row ED from 12/04/2021 in [facility] ED from 11/09/2021 in [facility] ED from 11/02/2020 in [facility] Error: Q3, 4, or 5 should not be populated when Q2 is No High Risk High Risk      Risk - Low on 12/04/21  CCA Screening, Triage and Referral (STR)  Patient Reported Information How did you hear about [place]? Self  What Is the Reason for Your Visit/Call Today? Pt lost his hotel room today and is suicidal.  How Long Has This Been Causing You Problems? 1 wk - 1 month  What Do You Feel Would Help You the Most Today? Treatment for Depression or other mood problem; Housing Assistance   Have You Recently Had Any Thoughts About Hurting Yourself? Yes  Are You Planning to Commit Suicide/Harm Yourself At This time? Yes (plans to walk in front of car or OD)   Have you Recently Had Thoughts About [place]? No  Are You Planning  to Harm Someone at This Time? No  Explanation: No data recorded  Have You Used Any Alcohol or Drugs in the Past 24 Hours? Yes  How Long Ago Did You Use Drugs or Alcohol? No data recorded What Did You Use and How Much? ETOH - 3 - 56s yesterday   Do You Currently Have a Therapist/Psychiatrist? No  Name of Therapist/Psychiatrist: No data recorded  Have You Been Recently Discharged From Any Office Practice or Programs? No  Explanation of Discharge From Practice/Program: No data recorded    CCA Screening Triage Referral Assessment Type of Contact: Face-to-Face  Telemedicine Service Delivery:   Is this Initial or Reassessment? No data recorded Date Telepsych consult ordered in [facility]:  No data recorded Time Telepsych consult ordered in [facility]:  No data recorded Location of Assessment: [facility] Assessment Services  Provider Location: [facility] Assessment Services   Collateral Involvement: none available   Does Patient Have a [place]? No data recorded Name and Contact of Legal Guardian: No data recorded If Minor and Not Living with Parent(s), Who has Custody? No data recorded Is CPS involved or ever been involved? Never  Is APS involved or ever been involved? Never   Patient Determined To Be At Risk for Harm To Self or Others Based on Review of Patient Reported Information or Presenting Complaint? No  Method: No data recorded Availability of Means: No data recorded Intent: No data recorded Notification Required: No data recorded Additional Information for Danger to Others Potential: No data recorded Additional Comments for Danger to Others Potential: No data recorded Are There Guns or Other Weapons in Your Home? No data recorded Types of Guns/Weapons: No data recorded Are These Weapons Safely Secured?                            No data recorded Who Could Verify You Are Able To Have These Secured: No data recorded Do You Have any Outstanding Charges, Pending  Court Dates, Parole/Probation? No data recorded Contacted To Inform of Risk of Harm To Self or Others: No data recorded   Does Patient Present under Involuntary Commitment? No  IVC Papers Initial File Date: No data recorded  [place] of Residence: [place]   Patient Currently Receiving the Following Services: Not Receiving Services   Determination of Need: Urgent (48 hours)   Options For Referral: Outpatient Therapy; Medication Management; Inpatient Hospitalization     CCA Biopsychosocial Patient Reported Schizophrenia/Schizoaffective Diagnosis in Past: No   Strengths: Seeking long term residential treatment   Mental Health Symptoms Depression:   Hopelessness; Irritability; Sleep (too much or little)   Duration of Depressive symptoms:  Duration of Depressive Symptoms: Greater than two weeks   Mania:   None   Anxiety:    None   Psychosis:   None   Duration of Psychotic symptoms:  Trauma:   None   Obsessions:   None   Compulsions:  No data recorded  Inattention:   N/A   Hyperactivity/Impulsivity:   N/A   Oppositional/Defiant Behaviors:   N/A   Emotional Irregularity:  No data recorded  Other Mood/Personality Symptoms:   depressed mood    Mental Status Exam Appearance and self-care  Stature:   Average   Weight:   Average weight   Clothing:   Casual   Grooming:   Normal   Cosmetic use:   None   Posture/gait:   Normal   Motor activity:   Not Remarkable   Sensorium  Attention:   Normal   Concentration:   Normal   Orientation:   X5   Recall/memory:   Normal   Affect and Mood  Affect:   Appropriate   Mood:   Depressed; Negative   Relating  Eye contact:   Normal   Facial expression:   Responsive; Depressed   Attitude toward examiner:   Cooperative   Thought and Language  Speech flow:  Clear and Coherent   Thought content:   Appropriate to Mood and Circumstances   Preoccupation:   None    Hallucinations:   None   Organization:  No data recorded  [employer] of Knowledge:   Average   Intelligence:   Average   Abstraction:   Functional   Judgement:   Fair   Reality Testing:   Variable   Insight:   Gaps   Decision Making:   Impulsive   Social Functioning  Social Maturity:   Impulsive   Social Judgement:   Naive   Stress  Stressors:   Museum/gallery curator; Relationship; Family conflict   Coping Ability:   Exhausted   Skill Deficits:   Responsibility; Self-control   Supports:   Friends/Service system     Religion: Religion/Spirituality Are You A Religious Person?: No  Leisure/Recreation: Leisure / Recreation Do You Have Hobbies?: No  Exercise/Diet: Exercise/Diet Do You Exercise?: No Have You Gained or Lost A Significant Amount of Weight in the Past Six Months?: No Do You Follow a Special Diet?: No Do You Have Any Trouble Sleeping?: Yes Explanation of Sleeping Difficulties: 3-4 hours per night   CCA Employment/Education Employment/Work Situation: Employment / Work Situation Employment Situation: Unemployed Patient's Job has Been Impacted by Current Illness: No Has Patient ever Been in Passenger transport manager?: No  Education: Education Is Patient Currently Attending School?: No Last Grade Completed: 12 ([employer]) Did [place]?: No Did You Have An Individualized Education Program (IIEP): No Did You Have Any Difficulty At School?: No Patient's Education Has Been Impacted by Current Illness: No   CCA Family/Childhood History Family and Relationship History: Family history Marital status: Single Does patient have children?: Yes How many children?: 3 How is patient's relationship with their children?: distant relationships  Childhood History:  Childhood History By whom was/is the patient raised?: Foster parents, Grandparents Did patient suffer any verbal/emotional/physical/sexual abuse as a child?: Yes Did patient suffer  from severe childhood neglect?: No Has patient ever been sexually abused/assaulted/raped as an adolescent or adult?: No Was the patient ever a victim of a crime or a disaster?: No Witnessed domestic violence?: No Has patient been affected by domestic violence as an adult?: No  Child/Adolescent Assessment:     CCA Substance Use Alcohol/Drug Use: Alcohol / Drug Use Pain Medications: Please see MAR Prescriptions: Please see MAR Over the Counter: Please see MAR History of alcohol / drug use?: Yes  Longest period of sobriety (when/how long): 16 years Negative Consequences of Use: Financial, Personal relationships Withdrawal Symptoms: None                         ASAM's:  Six Dimensions of Multidimensional Assessment  Dimension 1:  Acute Intoxication and/or Withdrawal Potential:   Dimension 1:  Description of individual's past and current experiences of substance use and withdrawal: Ongoing substance use, no current complaints of withdrawal complications  Dimension 2:  Biomedical Conditions and Complications:   Dimension 2:  Description of patient's biomedical conditions and  complications: Patient has sveral medical conditions that are compromised by his drug and alcohol use  Dimension 3:  Emotional, Behavioral, or Cognitive Conditions and Complications:  Dimension 3:  Description of emotional, behavioral, or cognitive conditions and complications: Per history, Pt has schizoaffective disorder and is non-compliant with treatment  for this condition  Dimension 4:  Readiness to Change:  Dimension 4:  Description of Readiness to Change criteria: Patient states that he is ready to take the steps necessary to change his life  Dimension 5:  Relapse, Continued use, or Continued Problem Potential:  Dimension 5:  Relapse, continued use, or continued problem potential critiera description: Patient has poor coping mechanisms and has hx of multiple relapses  Dimension 6:  Recovery/Living  Environment:  Dimension 6:  Recovery/Iiving environment criteria description: No support, he and gf broke up  ASAM Severity Score: ASAM's Severity Rating Score: 10  ASAM Recommended Level of Treatment: ASAM Recommended Level of Treatment: Level III Residential Treatment   Substance use Disorder (SUD) Substance Use Disorder (SUD)  Checklist Symptoms of Substance Use: Persistent desire or unsuccessful efforts to cut down or control use, Continued use despite having a persistent/recurrent physical/psychological problem caused/exacerbated by use, Continued use despite persistent or recurrent social, interpersonal problems, caused or exacerbated by use, Evidence of withdrawal (Comment), Large amounts of time spent to obtain, use or recover from the substance(s), Recurrent use that results in a failure to fulfill major role obligations (work, school, home), Social, occupational, recreational activities given up or reduced due to use, Substance(s) often taken in larger amounts or over longer times than was intended  Recommendations for Services/Supports/Treatments: Recommendations for Services/Supports/Treatments Recommendations For Services/Supports/Treatments: CD-IOP Intensive Chemical Dependency Program, Peer Support Services  Discharge Disposition:    DSM5 Diagnoses: Patient Active Problem List   Diagnosis Date Noted   Erectile dysfunction 12/06/2020   BPH associated with nocturia 12/06/2020   Foot pain 12/06/2020   Suicidal ideation    Major depressive disorder, recurrent, severe with psychotic features ([place])    Healthcare maintenance 02/14/2020   Weight loss, unintentional 11/10/2019   History of prediabetes 11/10/2019   Cocaine use disorder, severe, dependence ([place]) 11/27/2016   Hypertension 12/22/2014   GERD (gastroesophageal reflux disease) 12/22/2014   Tobacco use disorder 12/22/2014   Cannabis use disorder, severe, dependence ([place]) 12/22/2014   Alcohol use disorder, mild, abuse  12/22/2014   Schizoaffective disorder ([place]) 12/22/2014   History of hepatitis C virus infection 09/01/2013     Referrals to Alternative Service(s): Referred to Alternative Service(s):   Place:   Date:   Time:    Referred to Alternative Service(s):   Place:   Date:   Time:    Referred to Alternative Service(s):   Place:   Date:   Time:    Referred to Alternative Service(s):   Place:   Date:   Time:     Fransico Meadow,  [facility]

## 2021-12-04 NOTE — BH Assessment (Signed)
Pt reports he was put out of his house on 8/1 due to him not being able to pay his bills. Pt has been living in a hotel since and reports today he was "kicked out the hotel". 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 Pt reports he was not able to stay in a shelter because he is a registered sex offender. Pt reports he even tried to get arrested so he would have somewhere to stay. Pt reports he attempted to kill himself a couple of weeks ago by taking a lot of tylenol. Pt reports SI for the past two weeks and has a plan to walk in front of a car or take pills to kill himself. Pt reports THC every other day, alcohol daily and cocaine last use was about three days ago.

## 2022-02-09 ENCOUNTER — Other Ambulatory Visit: Payer: Self-pay | Admitting: Student

## 2022-02-11 ENCOUNTER — Other Ambulatory Visit: Payer: Self-pay | Admitting: Student

## 2022-02-11 NOTE — Telephone Encounter (Signed)
Pt has canceled several appts. LOV was 12/06/20. Pt was called -  no answer.

## 2022-02-21 ENCOUNTER — Encounter: Payer: Medicaid Other | Admitting: Student

## 2022-04-04 ENCOUNTER — Encounter: Payer: Medicaid Other | Admitting: Student

## 2022-04-19 ENCOUNTER — Other Ambulatory Visit (HOSPITAL_COMMUNITY): Payer: Self-pay

## 2022-06-12 ENCOUNTER — Encounter: Payer: Self-pay | Admitting: *Deleted

## 2022-06-19 ENCOUNTER — Encounter: Payer: Medicaid Other | Admitting: Student

## 2022-06-24 ENCOUNTER — Encounter: Payer: Medicaid Other | Admitting: Student

## 2022-08-16 ENCOUNTER — Telehealth: Payer: Self-pay

## 2022-08-16 ENCOUNTER — Ambulatory Visit: Payer: Medicaid Other

## 2022-08-16 VITALS — BP 138/98 | HR 79 | Temp 97.6°F | Ht 69.0 in | Wt 173.6 lb

## 2022-08-16 DIAGNOSIS — M79671 Pain in right foot: Secondary | ICD-10-CM

## 2022-08-16 DIAGNOSIS — N529 Male erectile dysfunction, unspecified: Secondary | ICD-10-CM | POA: Diagnosis not present

## 2022-08-16 DIAGNOSIS — M79672 Pain in left foot: Secondary | ICD-10-CM | POA: Diagnosis not present

## 2022-08-16 DIAGNOSIS — R351 Nocturia: Secondary | ICD-10-CM

## 2022-08-16 DIAGNOSIS — I10 Essential (primary) hypertension: Secondary | ICD-10-CM

## 2022-08-16 DIAGNOSIS — Z0000 Encounter for general adult medical examination without abnormal findings: Secondary | ICD-10-CM

## 2022-08-16 DIAGNOSIS — Z87898 Personal history of other specified conditions: Secondary | ICD-10-CM | POA: Diagnosis not present

## 2022-08-16 DIAGNOSIS — F251 Schizoaffective disorder, depressive type: Secondary | ICD-10-CM | POA: Diagnosis present

## 2022-08-16 DIAGNOSIS — K219 Gastro-esophageal reflux disease without esophagitis: Secondary | ICD-10-CM | POA: Diagnosis not present

## 2022-08-16 DIAGNOSIS — N3941 Urge incontinence: Secondary | ICD-10-CM | POA: Diagnosis not present

## 2022-08-16 DIAGNOSIS — N401 Enlarged prostate with lower urinary tract symptoms: Secondary | ICD-10-CM

## 2022-08-16 LAB — POCT GLYCOSYLATED HEMOGLOBIN (HGB A1C): Hemoglobin A1C: 5.5 % (ref 4.0–5.6)

## 2022-08-16 LAB — GLUCOSE, CAPILLARY: Glucose-Capillary: 103 mg/dL — ABNORMAL HIGH (ref 70–99)

## 2022-08-16 MED ORDER — SILDENAFIL CITRATE 50 MG PO TABS: 25.0000 mg | ORAL_TABLET | Freq: Every day | ORAL | 2 refills | Status: DC | PRN

## 2022-08-16 MED ORDER — HYDROXYZINE HCL 25 MG PO TABS: 25.0000 mg | ORAL_TABLET | Freq: Four times a day (QID) | ORAL | 0 refills | Status: DC | PRN

## 2022-08-16 MED ORDER — AMLODIPINE BESYLATE 5 MG PO TABS: 5.0000 mg | ORAL_TABLET | Freq: Every day | ORAL | 1 refills | Status: DC

## 2022-08-16 MED ORDER — PANTOPRAZOLE SODIUM 40 MG PO TBEC: 40.0000 mg | DELAYED_RELEASE_TABLET | Freq: Every day | ORAL | 1 refills | Status: DC

## 2022-08-16 NOTE — Assessment & Plan Note (Addendum)
Patient presents with history of hypertension, blood pressure today 138/98.  Had been previously prescribed amlodipine and valsartan-hydrochlorothiazide but has not been taking these.  He denies chest pain, shortness of breath, vision changes, headache. -Restart amlodipine 5 mg daily -Return in 2 to 4 weeks for blood pressure recheck -Repeat BMP

## 2022-08-16 NOTE — Assessment & Plan Note (Signed)
Patient has history of urinary incontinence previously thought to be related to BPH so this was eventually felt likely not to be the case.  He was prescribed Flomax but is not taking this.  Continues to endorse symptoms I feel are consistent with urge incontinence.  Denies symptoms of stress incontinence.  He is not medicated for this but uses depends daily.  We will request records from urology before beginning management. -DME request for depends

## 2022-08-16 NOTE — Assessment & Plan Note (Signed)
Patient reports history of erectile dysfunction treated with sildenafil.  He endorses benefit from this medication.  He reports that he has trouble achieving and maintaining an erection.  He denies morning erections. -Sildenafil 25 mg daily

## 2022-08-16 NOTE — Patient Instructions (Addendum)
Ethan [name], it was a pleasure seeing you today!  Today we discussed: Anxiety/depression - take hydroxyzine up to 3 times daily. Will refer to psychiatry. High blood pressure - take amlodipine 5 mg daily. Return in 2-4 weeks for blood pressure recheck Erectile dysfunction - refilled sildenafil  Will call back with lab results.   I have ordered the following labs today:  Lab Orders         BMP8+Anion Gap         Lipid Profile         HIV antibody (with reflex)         POC Hbg A1C       Tests ordered today:  none  Referrals ordered today:   Referral Orders         Ambulatory referral to Psychiatry         Ambulatory Referral for DME         Ambulatory Referral for DME       I have ordered the following medication/changed the following medications:   Stop the following medications: Medications Discontinued During This Encounter  Medication Reason   sildenafil (REVATIO) 20 MG tablet Change in therapy   hydrOXYzine (ATARAX/VISTARIL) 25 MG tablet Reorder   amLODipine (NORVASC) 5 MG tablet Reorder   pantoprazole (PROTONIX) 40 MG tablet Reorder     Start the following medications: Meds ordered this encounter  Medications   amLODipine (NORVASC) 5 MG tablet    Sig: Take 1 tablet (5 mg total) by mouth daily.    Dispense:  90 tablet    Refill:  1   pantoprazole (PROTONIX) 40 MG tablet    Sig: Take 1 tablet (40 mg total) by mouth daily.    Dispense:  90 tablet    Refill:  1   sildenafil (VIAGRA) 50 MG tablet    Sig: Take 0.5 tablets (25 mg total) by mouth daily as needed for erectile dysfunction.    Dispense:  10 tablet    Refill:  2   hydrOXYzine (ATARAX) 25 MG tablet    Sig: Take 1 tablet (25 mg total) by mouth every 6 hours as needed for anxiety.    Dispense:  120 tablet    Refill:  0     Follow-up:  2-4 weeks    Please make sure to arrive 15 minutes prior to your next appointment. If you arrive late, you may be asked to reschedule.   We look forward  to seeing you next time. Please call our clinic at [phone] if you have any questions or concerns. The best time to call is Monday-Friday from 9am-4pm, but there is someone available 24/7. If after hours or the weekend, call the main hospital number and ask for the Internal Medicine Resident On-Call. If you need medication refills, please notify your pharmacy one week in advance and they will send [place] a request.  Thank you for letting us take part in your care. Wishing you the best!  Thank you, Adron Bene, MD

## 2022-08-16 NOTE — Assessment & Plan Note (Addendum)
Patient presents with history of schizoaffective disorder.  Previously treated with risperidone, trazodone, and hydroxyzine as needed though he has not been taking any of these.  He does endorse symptoms of depression and anxiety.  He states he has been taking his neighbors Xanax as this helps calm his nerves and prevents him from using alcohol or drugs.  We discussed that Xanax is not a good solution to his mood symptoms.  Will refer to psychiatry and give as needed hydroxyzine in the meantime.  He denies SI or HI today.  Reports that he did have HI when on antidepressants.  Denies any recent cocaine use. -Referral to psychiatry -Hydroxyzine 25 mg 3 times daily as needed

## 2022-08-16 NOTE — Progress Notes (Signed)
CC: hypertension, depression f/u  HPI:  [name] is a 57 y.o. with past medical history as below who presents for follow up of his chronic conditions. Please see detailed assessment and plan for HPI.  Past Medical History:  Diagnosis Date   Alcoholism (HCC)    Anxiety    Depression    GERD (gastroesophageal reflux disease)    Heart murmur    Hepatitis C    Treated in prison for 18 months.  Sounds like Ribaviron and Interferon  2009-2011   Hypertension    Peptic ulcer    Substance abuse (HCC)    Urge urinary incontinence    07/20/2015:  evaluated by Dr. Ihor Gully, Urodynamics planned   Review of Systems:  See detailed assessment and plan for pertinent ROS.  Physical Exam:  Vitals:   08/16/22 0927 08/16/22 1004  BP: (!) 130/100 (!) 138/98  Pulse: 79 79  Temp: 97.6 F (36.4 C)   TempSrc: Oral   SpO2: 100%   Weight: 173 lb 9.6 oz (78.7 kg)   Height: 5\' 9"  (1.753 m)    Physical Exam Constitutional:      General: He is not in acute distress. HENT:     Head: Normocephalic and atraumatic.  Eyes:     Extraocular Movements: Extraocular movements intact.  Cardiovascular:     Rate and Rhythm: Normal rate and regular rhythm.     Heart sounds: No murmur heard. Pulmonary:     Effort: Pulmonary effort is normal.     Breath sounds: No wheezing.  Musculoskeletal:     Cervical back: Neck supple.     Right lower leg: No edema.     Left lower leg: No edema.  Skin:    General: Skin is warm and dry.  Neurological:     Mental Status: He is alert and oriented to person, place, and time.  Psychiatric:        Mood and Affect: Mood normal.        Behavior: Behavior normal.      Assessment & Plan:   See Encounters Tab for problem based charting.  Hypertension Patient presents with history of hypertension, blood pressure today 138/98.  Had been previously prescribed amlodipine and valsartan-hydrochlorothiazide but has not been taking these.  He denies chest  pain, shortness of breath, vision changes, headache. -Restart amlodipine 5 mg daily -Return in 2 to 4 weeks for blood pressure recheck -Repeat BMP  Schizoaffective disorder [facility]) Patient presents with history of schizoaffective disorder.  Previously treated with risperidone, trazodone, and hydroxyzine as needed though he has not been taking any of these.  He does endorse symptoms of depression and anxiety.  He states he has been taking his neighbors Xanax as this helps calm his nerves and prevents him from using alcohol or drugs.  We discussed that Xanax is not a good solution to his mood symptoms.  Will refer to psychiatry and give as needed hydroxyzine in the meantime.  He denies SI or HI today.  Reports that he did have HI when on antidepressants.  Denies any recent cocaine use. -Referral to psychiatry -Hydroxyzine 25 mg 3 times daily as needed  Erectile dysfunction Patient reports history of erectile dysfunction treated with sildenafil.  He endorses benefit from this medication.  He reports that he has trouble achieving and maintaining an erection.  He denies morning erections. -Sildenafil 25 mg daily  Urge urinary incontinence Patient has history of urinary incontinence previously thought to be related to  BPH so this was eventually felt likely not to be the case.  He was prescribed Flomax but is not taking this.  Continues to endorse symptoms I feel are consistent with urge incontinence.  Denies symptoms of stress incontinence.  He is not medicated for this but uses depends daily.  We will request records from urology before beginning management. -DME request for depends  Healthcare maintenance Patient is due for HIV screening, lipid profile, and A1c check.  He also requests a cane due to walking difficulty which she attributes to corns and calluses. -DME request for cane -Referral to podiatry  Patient discussed with Dr.  Lafonda Mosses

## 2022-08-16 NOTE — Telephone Encounter (Signed)
Patient is returning your phone call about his medication.  Please give patient a call back.  Forwarding message to Dr. Cliffton Asters and triage nurse.

## 2022-08-16 NOTE — Assessment & Plan Note (Signed)
Patient is due for HIV screening, lipid profile, and A1c check.  He also requests a cane due to walking difficulty which she attributes to corns and calluses. -DME request for cane -Referral to podiatry

## 2022-08-17 LAB — LIPID PANEL
Chol/HDL Ratio: 3.3 ratio (ref 0.0–5.0)
Cholesterol, Total: 210 mg/dL — ABNORMAL HIGH (ref 100–199)
HDL: 63 mg/dL (ref >39)
LDL Chol Calc (NIH): 137 mg/dL — ABNORMAL HIGH (ref 0–99)
Triglycerides: 57 mg/dL (ref 0–149)
VLDL Cholesterol Cal: 10 mg/dL (ref 5–40)

## 2022-08-17 LAB — BMP8+ANION GAP
Anion Gap: 17 mmol/L (ref 10.0–18.0)
BUN/Creatinine Ratio: 13 (ref 9–20)
BUN: 12 mg/dL (ref 6–24)
CO2: 22 mmol/L (ref 20–29)
Calcium: 9.6 mg/dL (ref 8.7–10.2)
Chloride: 98 mmol/L (ref 96–106)
Creatinine, Ser: 0.92 mg/dL (ref 0.76–1.27)
Glucose: 95 mg/dL (ref 70–99)
Potassium: 4.6 mmol/L (ref 3.5–5.2)
Sodium: 137 mmol/L (ref 134–144)
eGFR: 96 mL/min/{1.73_m2} (ref >59)

## 2022-08-17 LAB — HIV ANTIBODY (ROUTINE TESTING W REFLEX): HIV Screen 4th Generation wRfx: NONREACTIVE

## 2022-08-19 NOTE — Progress Notes (Signed)
Internal Medicine Clinic Attending  Case discussed with Dr. Cliffton Asters  At the time of the visit.  We reviewed the resident's history and exam and pertinent patient test results.  I agree with the assessment, diagnosis, and plan of care documented in the resident's note.    I saw that unfortunately Mr [facility] Psychiat[facility] referral has been closed since he has Medicaid. Dr. Cliffton Asters will talk with our referrals coordinator about other options for referrals - he may need to be seen in [place], not [place].

## 2022-08-28 ENCOUNTER — Ambulatory Visit: Payer: Medicaid Other | Admitting: Podiatry

## 2022-09-04 ENCOUNTER — Ambulatory Visit (INDEPENDENT_AMBULATORY_CARE_PROVIDER_SITE_OTHER): Payer: Medicaid Other | Admitting: Podiatry

## 2022-09-04 DIAGNOSIS — Z91199 Patient's noncompliance with other medical treatment and regimen due to unspecified reason: Secondary | ICD-10-CM

## 2022-09-04 NOTE — Progress Notes (Signed)
No show

## 2022-09-16 ENCOUNTER — Encounter: Payer: Self-pay | Admitting: Student

## 2023-01-28 ENCOUNTER — Telehealth: Payer: Self-pay | Admitting: *Deleted

## 2023-01-28 NOTE — Telephone Encounter (Signed)
Spoke with patient regarding PCS / patient states he already has an aide that comes / states he is needing someone to help him with his bills , food stamps , etc. I called and left voice mail for [facility] (home health agency) [phone] /  to return call to [phone] regarding the PCS fax on this patient to clarify just what is needed.

## 2023-02-03 ENCOUNTER — Telehealth: Payer: Self-pay | Admitting: *Deleted

## 2023-02-03 NOTE — Telephone Encounter (Signed)
Call from [facility] regarding PCS paper work for Ethan [name]. Patient has not been seen by our office since May 2024 / patient will need to come in for a office visit to get this paper work completed.

## 2023-03-07 ENCOUNTER — Encounter: Payer: Medicaid Other | Admitting: Student

## 2023-03-24 ENCOUNTER — Other Ambulatory Visit: Payer: Self-pay

## 2023-03-24 DIAGNOSIS — K219 Gastro-esophageal reflux disease without esophagitis: Secondary | ICD-10-CM

## 2023-03-24 MED ORDER — PANTOPRAZOLE SODIUM 40 MG PO TBEC: 40.0000 mg | DELAYED_RELEASE_TABLET | Freq: Every day | ORAL | 1 refills | Status: DC

## 2023-03-24 NOTE — Telephone Encounter (Signed)
Medication sent to pharmacy  

## 2023-04-04 ENCOUNTER — Ambulatory Visit: Payer: MEDICAID | Admitting: Student

## 2023-04-04 VITALS — BP 138/85 | HR 86 | Temp 98.4°F | Ht 69.0 in | Wt 171.6 lb

## 2023-04-04 DIAGNOSIS — I10 Essential (primary) hypertension: Secondary | ICD-10-CM

## 2023-04-04 DIAGNOSIS — K921 Melena: Secondary | ICD-10-CM

## 2023-04-04 DIAGNOSIS — E7800 Pure hypercholesterolemia, unspecified: Secondary | ICD-10-CM | POA: Diagnosis not present

## 2023-04-04 DIAGNOSIS — R21 Rash and other nonspecific skin eruption: Secondary | ICD-10-CM

## 2023-04-04 DIAGNOSIS — L84 Corns and callosities: Secondary | ICD-10-CM

## 2023-04-04 DIAGNOSIS — N3941 Urge incontinence: Secondary | ICD-10-CM

## 2023-04-04 DIAGNOSIS — K219 Gastro-esophageal reflux disease without esophagitis: Secondary | ICD-10-CM | POA: Diagnosis not present

## 2023-04-04 MED ORDER — PANTOPRAZOLE SODIUM 40 MG PO TBEC: 40.0000 mg | DELAYED_RELEASE_TABLET | Freq: Every day | ORAL | 1 refills | Status: AC

## 2023-04-04 MED ORDER — ATORVASTATIN CALCIUM 40 MG PO TABS: 40.0000 mg | ORAL_TABLET | Freq: Every day | ORAL | 11 refills | Status: AC

## 2023-04-04 NOTE — Patient Instructions (Signed)
  Thank you, Ethan [name], for allowing [place] to provide your care today. Today we discussed . . .  > Reflux       -I am going to send a referral to the gastroenterologist to have them consider looking in your stomach with a camera.  I have sent in a refill of your pantoprazole.  We are also going to check your blood counts due to the dark stools you are having.  I will call you with the results of these labs and the gastroenterologist office should call to schedule with you. > Cholesterol       -I have sent in a prescription for atorvastatin 40 mg daily which is a medication that helps lower your cholesterol and decreases your risk of heart attack and stroke.  If this medication is not too expensive please pick it up and take it every day.  We will plan to recheck your cholesterol levels about 2 months after you start this medication.   I have ordered the following labs for you:  Lab Orders         CBC with Diff         RPR         CMP14 + Anion Gap         TSH      Referrals ordered today:   Referral Orders         Ambulatory referral to Gastroenterology         Ambulatory referral to Podiatry       I have ordered the following medication/changed the following medications:   Stop the following medications: Medications Discontinued During This Encounter  Medication Reason   valsartan-hydrochlorothiazide (DIOVAN-HCT) 160-12.5 MG tablet    amLODipine (NORVASC) 5 MG tablet    traZODone (DESYREL) 50 MG tablet    thiamine 100 MG tablet    risperiDONE (RISPERDAL) 0.5 MG tablet    sildenafil (VIAGRA) 50 MG tablet    nicotine (NICODERM CQ - DOSED IN MG/24 HOURS) 21 mg/24hr patch    Multiple Vitamin (MULTIVITAMIN WITH MINERALS) TABS tablet    hydrOXYzine (ATARAX) 25 MG tablet    ketoconazole (NIZORAL) 2 % cream    pantoprazole (PROTONIX) 40 MG tablet Reorder     Start the following medications: Meds ordered this encounter  Medications   pantoprazole (PROTONIX) 40 MG tablet     Sig: Take 1 tablet (40 mg total) by mouth daily.    Dispense:  90 tablet    Refill:  1   atorvastatin (LIPITOR) 40 MG tablet    Sig: Take 1 tablet (40 mg total) by mouth daily.    Dispense:  30 tablet    Refill:  11      Follow up: 2 months    Remember:     Should you have any questions or concerns please call the internal medicine clinic at [phone].     Rocky Morel, DO [facility]

## 2023-04-04 NOTE — Progress Notes (Unsigned)
CC: Routine Follow Up   HPI:  Ethan [name] is a 57 y.o. male with pertinent PMH of HTN, prediabetes, schizoaffective disorder, polysubstance use disorder, MDD, ED, BPH, and urge incontinence who presents to the clinic for routine follow-up. Please see assessment and plan below for further details.  Past Medical History:  Diagnosis Date   Alcoholism (HCC)    Anxiety    Depression    GERD (gastroesophageal reflux disease)    Heart murmur    Hepatitis C    Treated in prison for 18 months.  Sounds like Ribaviron and Interferon  2009-2011   Hypertension    Peptic ulcer    Substance abuse (HCC)    Urge urinary incontinence    07/20/2015:  evaluated by Dr. Ihor Gully, Urodynamics planned    Current Outpatient Medications  Medication Instructions   atorvastatin (LIPITOR) 40 mg, Oral, Daily   pantoprazole (PROTONIX) 40 mg, Oral, Daily   tamsulosin (FLOMAX) 0.4 mg, Oral, Daily     Review of Systems:   Pertinent items noted in HPI and/or A&P.  Physical Exam:  Vitals:   04/04/23 1114  BP: 138/85  Pulse: 86  Temp: 98.4 F (36.9 C)  TempSrc: Oral  SpO2: 98%  Weight: 171 lb 9.6 oz (77.8 kg)  Height: 5\' 9"  (1.753 m)    Constitutional: Well-appearing adult male. In no acute distress. HEENT: Normocephalic, atraumatic, Sclera non-icteric, PERRL, EOM intact Cardio:Regular rate and rhythm. 2+ bilateral radial and dorsalis pedis  pulses. Pulm:Clear to auscultation bilaterally. Normal work of breathing on room air. Abdomen: Soft, non-tender, non-distended, positive bowel sounds. RUE:[id number] for extremity edema. Skin:Warm and dry.  Hyperpigmented papules on the palms and soles bilaterally with a couple on the bilateral shins, otherwise no rash.  Thick calluses and multiple pressure points on the soles of his feet bilaterally. Neuro:Alert and oriented x3. No focal deficit noted. Psych:Pleasant mood and affect.   Assessment & Plan:   Papular rash, localized Patient notes  several months of a rash on his palms and soles bilaterally.  There are also a couple marks on his shins but otherwise no rashes noted.  This rash is made up of hyperpigmented papules primarily on the palms and soles.  No known trigger, aggravating/alleviating factors, no history of syphilis, no recent tick bites.  We will send an RPR and then reevaluate next visit.  He also has some associated calluses on his feet and due to this in conjunction with a rash I think podiatry evaluation for intervention especially if RPR is negative is warranted.  Hypertension Blood pressure today slightly elevated at 138/85.  Discussed blood pressure medications but he is very hesitant to take these due to an apparent syncopal episode about 2 years ago when he was on blood pressure medications.  He will keep a blood pressure log and we will review it at his next visit.  GERD (gastroesophageal reflux disease) Patient has had a long history of bothersome reflux symptoms.  Recently he has had dark bowel movements in addition to some bright red blood streaked on the stool every so often.  He has about 3-4 bowel movements a day.  Denies any weight loss, fevers, dysphagia, or other red flag symptoms.  He has been taking Pepto-Bismol at home but otherwise has not been taking a PPI or any other medications.  He did find benefit in his symptoms when he was on a PPI.  Considered H. pylori breath testing today but he is taken Pepto-Bismol in the  past week. - Referral to GI for endoscopy/colonoscopy - CBC today - Start pantoprazole 40 mg daily  Pure hypercholesterolemia Last lipid panel from 08/2022 indicates that he has an ASCVD risk of 23%.  Discussed statin medication with the patient and he is willing to start this medication. - Start atorvastatin 40 mg daily and recheck lipid panel 6-8 weeks after starting this  Urge urinary incontinence Patient continues to have bothersome urge urinary incontinence and requires incontinence  supplies.  He has taken Flomax for this but has not found any benefit. - DME request for incontinence supplies    Patient discussed with Dr. Theodosia Paling, DO [facility] Internal Medicine Resident PGY-2 Clinic Phone: [phone] Pager: [phone]

## 2023-04-07 LAB — CMP14 + ANION GAP
ALT: 32 IU/L (ref 0–44)
AST: 39 IU/L (ref 0–40)
Albumin: 4.3 g/dL (ref 3.8–4.9)
Alkaline Phosphatase: 80 IU/L (ref 44–121)
Anion Gap: 17 mmol/L (ref 10.0–18.0)
BUN/Creatinine Ratio: 10 (ref 9–20)
BUN: 11 mg/dL (ref 6–24)
Bilirubin Total: 0.2 mg/dL (ref 0.0–1.2)
CO2: 22 mmol/L (ref 20–29)
Calcium: 9.1 mg/dL (ref 8.7–10.2)
Chloride: 100 mmol/L (ref 96–106)
Creatinine, Ser: 1.05 mg/dL (ref 0.76–1.27)
Globulin, Total: 2.8 g/dL (ref 1.5–4.5)
Glucose: 74 mg/dL (ref 70–99)
Potassium: 4.2 mmol/L (ref 3.5–5.2)
Sodium: 139 mmol/L (ref 134–144)
Total Protein: 7.1 g/dL (ref 6.0–8.5)
eGFR: 82 mL/min/{1.73_m2} (ref >59)

## 2023-04-07 LAB — CBC WITH DIFFERENTIAL/PLATELET
Basophils Absolute: 0 10*3/uL (ref 0.0–0.2)
Basos: 1 %
EOS (ABSOLUTE): 0.1 10*3/uL (ref 0.0–0.4)
Eos: 3 %
Hematocrit: 45.6 % (ref 37.5–51.0)
Hemoglobin: 15.2 g/dL (ref 13.0–17.7)
Immature Grans (Abs): 0 10*3/uL (ref 0.0–0.1)
Immature Granulocytes: 1 %
Lymphocytes Absolute: 1.7 10*3/uL (ref 0.7–3.1)
Lymphs: 42 %
MCH: 28.5 pg (ref 26.6–33.0)
MCHC: 33.3 g/dL (ref 31.5–35.7)
MCV: 85 fL (ref 79–97)
Monocytes Absolute: 0.5 10*3/uL (ref 0.1–0.9)
Monocytes: 12 %
Neutrophils Absolute: 1.6 10*3/uL (ref 1.4–7.0)
Neutrophils: 41 %
Platelets: 316 10*3/uL (ref 150–450)
RBC: 5.34 x10E6/uL (ref 4.14–5.80)
RDW: 12.8 % (ref 11.6–15.4)
WBC: 4 10*3/uL (ref 3.4–10.8)

## 2023-04-07 LAB — RPR

## 2023-04-07 LAB — TSH: TSH: 0.601 u[IU]/mL (ref 0.450–4.500)

## 2023-04-09 DIAGNOSIS — R21 Rash and other nonspecific skin eruption: Secondary | ICD-10-CM | POA: Insufficient documentation

## 2023-04-09 DIAGNOSIS — L84 Corns and callosities: Secondary | ICD-10-CM | POA: Insufficient documentation

## 2023-04-09 DIAGNOSIS — E7800 Pure hypercholesterolemia, unspecified: Secondary | ICD-10-CM | POA: Insufficient documentation

## 2023-04-09 DIAGNOSIS — K921 Melena: Secondary | ICD-10-CM | POA: Insufficient documentation

## 2023-04-09 NOTE — Assessment & Plan Note (Addendum)
Patient notes several months of a rash on his palms and soles bilaterally.  There are also a couple marks on his shins but otherwise no rashes noted.  This rash is made up of hyperpigmented papules primarily on the palms and soles.  No known trigger, aggravating/alleviating factors, no history of syphilis, no recent tick bites.  We will send an RPR and then reevaluate next visit.  He also has some associated calluses on his feet and due to this in conjunction with a rash I think podiatry evaluation for intervention especially if RPR is negative is warranted.

## 2023-04-09 NOTE — Assessment & Plan Note (Signed)
Blood pressure today slightly elevated at 138/85.  Discussed blood pressure medications but he is very hesitant to take these due to an apparent syncopal episode about 2 years ago when he was on blood pressure medications.  He will keep a blood pressure log and we will review it at his next visit.

## 2023-04-09 NOTE — Assessment & Plan Note (Signed)
Patient continues to have bothersome urge urinary incontinence and requires incontinence supplies.  He has taken Flomax for this but has not found any benefit. - DME request for incontinence supplies

## 2023-04-09 NOTE — Assessment & Plan Note (Signed)
Patient has had a long history of bothersome reflux symptoms.  Recently he has had dark bowel movements in addition to some bright red blood streaked on the stool every so often.  He has about 3-4 bowel movements a day.  Denies any weight loss, fevers, dysphagia, or other red flag symptoms.  He has been taking Pepto-Bismol at home but otherwise has not been taking a PPI or any other medications.  He did find benefit in his symptoms when he was on a PPI.  Considered H. pylori breath testing today but he is taken Pepto-Bismol in the past week. - Referral to GI for endoscopy/colonoscopy - CBC today - Start pantoprazole 40 mg daily

## 2023-04-09 NOTE — Assessment & Plan Note (Signed)
Last lipid panel from 08/2022 indicates that he has an ASCVD risk of 23%.  Discussed statin medication with the patient and he is willing to start this medication. - Start atorvastatin 40 mg daily and recheck lipid panel 6-8 weeks after starting this

## 2023-04-11 ENCOUNTER — Telehealth: Payer: Self-pay | Admitting: *Deleted

## 2023-04-11 NOTE — Telephone Encounter (Signed)
Patient was contacted to inform his PCS forms has been faxed for review - scheduling for assessment. [facility] (Nicolette Lewis-[phone])/ [phone]. Also faxed to [place]-fax [phone].

## 2023-04-11 NOTE — Progress Notes (Signed)
Internal Medicine Clinic Attending  Case discussed with the resident at the time of the visit.  We reviewed the resident's history and exam and pertinent patient test results.  I agree with the assessment, diagnosis, and plan of care documented in the resident's note.  

## 2023-04-18 ENCOUNTER — Ambulatory Visit: Payer: MEDICAID | Admitting: Podiatry

## 2023-05-19 ENCOUNTER — Ambulatory Visit: Payer: MEDICAID | Admitting: Podiatry

## 2023-06-03 ENCOUNTER — Encounter: Payer: Self-pay | Admitting: Gastroenterology

## 2023-07-15 NOTE — Progress Notes (Deleted)
 Chief Complaint: Primary GI MD: Dr. Christella Hartigan  HPI: 58 year old male history of substance abuse, hepatitis C with treatment 2009-2011, GERD, anxiety, alcoholism, presents for evaluation of  Labs with PCP 03/2023 showed normal CBC, CMP, TSH   Discussed the use of AI scribe software for clinical note transcription with the patient, who gave verbal consent to proceed.  History of Present Illness      PREVIOUS GI WORKUP   RUQ ultrasound 11/2019 for elevated LFTs, unintentional weight loss, hepatitis C with completed treatment - Hepatic steatosis.  No focal liver lesion - Normal gallbladder without gallstones or gallbladder wall thickening  Colonoscopy 10/2015 - One 3 mm polyp (hyperplastic) in the sigmoid colon, removed with a cold snare. Resected and retrieved.  - The examination was otherwise normal on direct and retroflexion views. - repeat 10 years  EGD 10/2015 for dysphagia and heartburn - Medium- sized hiatal hernia.  - The examination was otherwise normal. -  No specimens collected.  Past Medical History:  Diagnosis Date   Alcoholism (HCC)    Anxiety    Depression    GERD (gastroesophageal reflux disease)    Heart murmur    Hepatitis C    Treated in prison for 18 months.  Sounds like Ribaviron and Interferon  2009-2011   Hypertension    Peptic ulcer    Substance abuse (HCC)    Urge urinary incontinence    07/20/2015:  evaluated by Dr. Ihor Gully, Urodynamics planned    Past Surgical History:  Procedure Laterality Date   APPENDECTOMY     INGUINAL HERNIA REPAIR Bilateral     Current Outpatient Medications  Medication Sig Dispense Refill   atorvastatin (LIPITOR) 40 MG tablet Take 1 tablet (40 mg total) by mouth daily. 30 tablet 11   pantoprazole (PROTONIX) 40 MG tablet Take 1 tablet (40 mg total) by mouth daily. 90 tablet 1   tamsulosin (FLOMAX) 0.4 MG CAPS capsule Take 1 capsule (0.4 mg total) by mouth daily. 90 capsule 1   No current facility-administered  medications for this visit.    Allergies as of 07/16/2023   (No Known Allergies)    Family History  Problem Relation Age of Onset   Breast cancer Sister    Breast cancer Maternal Grandmother    Colon polyps Brother    Diabetes Sister        x 2   Diabetes Maternal Aunt    Diabetes Paternal Aunt    Diabetes Mother        DM complications cause of death   Alcohol abuse Father    COPD Neg Hx     Social History   Socioeconomic History   Marital status: Single    Spouse name: Not on file   Number of children: 3   Years of education: GED   Highest education level: Not on file  Occupational History   Occupation: Disability-vision    Comment: Ashok Cordia, 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 [employer]  Tobacco Use   Smoking status: Every Day    Current packs/day: 0.50    Average packs/day: 0.5 packs/day for 47.0 years (23.5 ttl pk-yrs)    Types: Cigarettes    Start date: 07/14/1976   Smokeless tobacco: Never   Tobacco comments:    Stopped for 16 years.  Restarted with bout of depression/anxiety recently  Substance and Sexual Activity   Alcohol use: Yes    Alcohol/week: 0.0 standard drinks of alcohol    Comment: 3 beers nightly   Drug use: Yes  Types: "Crack" cocaine    Comment: Occassionally MJ currently--in past crack cocaine--more than 16 years ago.   Sexual activity: Yes    Partners: Female    Birth control/protection: None    Comment: WIFE USED MAJUANA SATURDAY  Other Topics Concern   Not on file  Social History Narrative   Lost job and schooling secondary to "past"   Apparently for what he was incarcerated for.   Social Drivers of Health   Financial Resource Strain: High Risk (11/15/2019)   Overall Financial Resource Strain (CARDIA)    Difficulty of Paying Living Expenses: Hard  Food Insecurity: Food Insecurity Present (11/15/2019)   Hunger Vital Sign    Worried About Running Out of Food in the Last Year: Sometimes true    Ran Out of Food in the Last Year: Sometimes true   Transportation Needs: No Transportation Needs (11/15/2019)   PRAPARE - Administrator, Civil Service (Medical): No    Lack of Transportation (Non-Medical): No  Physical Activity: Insufficiently Active (11/15/2019)   Exercise Vital Sign    Days of Exercise per Week: 1 day    Minutes of Exercise per Session: 30 min  Stress: Not on file  Social Connections: Unknown (11/15/2019)   Social Connection and Isolation Panel [NHANES]    Frequency of Communication with Friends and Family: More than three times a week    Frequency of Social Gatherings with Friends and Family: More than three times a week    Attends Religious Services: Not on Marketing executive or Organizations: Not on file    Attends Banker Meetings: Not on file    Marital Status: Not on file  Intimate Partner Violence: Not on file    Review of Systems:    Constitutional: No weight loss, fever, chills, weakness or fatigue HEENT: Eyes: No change in vision               Ears, Nose, Throat:  No change in hearing or congestion Skin: No rash or itching Cardiovascular: No chest pain, chest pressure or palpitations   Respiratory: No SOB or cough Gastrointestinal: See HPI and otherwise negative Genitourinary: No dysuria or change in urinary frequency Neurological: No headache, dizziness or syncope Musculoskeletal: No new muscle or joint pain Hematologic: No bleeding or bruising Psychiatric: No history of depression or anxiety    Physical Exam:  Vital signs: There were no vitals taken for this visit.  Constitutional: NAD, Well developed, Well nourished, alert and cooperative Head:  Normocephalic and atraumatic. Eyes:   PEERL, EOMI. No icterus. Conjunctiva pink. Respiratory: Respirations even and unlabored. Lungs clear to auscultation bilaterally.   No wheezes, crackles, or rhonchi.  Cardiovascular:  Regular rate and rhythm. No peripheral edema, cyanosis or pallor.  Gastrointestinal:  Soft,  nondistended, nontender. No rebound or guarding. Normal bowel sounds. No appreciable masses or hepatomegaly. Rectal:  Not performed.  Msk:  Symmetrical without gross deformities. Without edema, no deformity or joint abnormality.  Neurologic:  Alert and  oriented x4;  grossly normal neurologically.  Skin:   Dry and intact without significant lesions or rashes. Psychiatric: Oriented to person, place and time. Demonstrates good judgement and reason without abnormal affect or behaviors.  Physical Exam    RELEVANT LABS AND IMAGING: CBC    Component Value Date/Time   WBC 4.0 04/04/2023 1152   WBC 18.3 (H) 11/09/2021 1027   RBC 5.34 04/04/2023 1152   RBC 5.06 11/09/2021 1027   HGB  15.2 04/04/2023 1152   HCT 45.6 04/04/2023 1152   PLT 316 04/04/2023 1152   MCV 85 04/04/2023 1152   MCH 28.5 04/04/2023 1152   MCH 28.9 11/09/2021 1027   MCHC 33.3 04/04/2023 1152   MCHC 33.2 11/09/2021 1027   RDW 12.8 04/04/2023 1152   LYMPHSABS 1.7 04/04/2023 1152   MONOABS 0.7 11/09/2021 1027   EOSABS 0.1 04/04/2023 1152   BASOSABS 0.0 04/04/2023 1152    CMP     Component Value Date/Time   NA 139 04/04/2023 1152   K 4.2 04/04/2023 1152   CL 100 04/04/2023 1152   CO2 22 04/04/2023 1152   GLUCOSE 74 04/04/2023 1152   GLUCOSE 109 (H) 11/09/2021 1027   BUN 11 04/04/2023 1152   CREATININE 1.05 04/04/2023 1152   CALCIUM 9.1 04/04/2023 1152   PROT 7.1 04/04/2023 1152   ALBUMIN 4.3 04/04/2023 1152   AST 39 04/04/2023 1152   ALT 32 04/04/2023 1152   ALKPHOS 80 04/04/2023 1152   BILITOT 0.2 04/04/2023 1152   GFRNONAA >60 11/09/2021 1027   GFRAA 90 11/09/2019 1041     Assessment/Plan:   Assessment and Plan Assessment & Plan        Lara Mulch [facility] 07/15/2023, 12:34 PM  Cc: Gust Rung, DO

## 2023-07-16 ENCOUNTER — Ambulatory Visit: Payer: MEDICAID | Admitting: Gastroenterology

## 2023-09-16 ENCOUNTER — Telehealth: Payer: Self-pay | Admitting: *Deleted

## 2023-09-16 NOTE — Telephone Encounter (Signed)
 Left vm for patient to return call regarding the outcome of his PCS request that was faxed 03-2023/ patient did not call back as to appointment or hours.

## 2023-09-18 ENCOUNTER — Telehealth: Payer: Self-pay | Admitting: *Deleted

## 2023-09-18 NOTE — Telephone Encounter (Signed)
 Spoke with patient regarding his PCS request / patient was to call to let me know if and when assessment appointment/ when with patient he states he already has a aide coming/ will call patient or he to call me with the information of the name of the agency and their phone number.

## 2023-09-23 ENCOUNTER — Encounter: Payer: Self-pay | Admitting: *Deleted

## 2023-10-06 ENCOUNTER — Telehealth: Payer: Self-pay

## 2023-10-06 ENCOUNTER — Telehealth: Payer: Self-pay | Admitting: *Deleted

## 2023-10-06 NOTE — Telephone Encounter (Signed)
 Copied from CRM [id number]. Topic: General - Phone/Fax/Address >> Sep 29, 2023 11:03 AM Susanna ORN wrote: Patient/patient representative is calling for clinic's phone, fax, or address information. Garrel, with [facility], called to get clinic's fax number to send a prescription request for incontinence supplies for patient. He also checked when patient's last time here and also wanted to check if Dr. Tobie was still patient's pcp. Advised it's now Dr. DOROTHA Pool. >> Oct 03, 2023  8:57 AM Alfonso ORN wrote: Ty calling from [facility] followup on status of in Contience supply was faxed over 09/29/23 Originally requested on 08/26/23   [phone]

## 2023-10-06 NOTE — Telephone Encounter (Signed)
 Copied from CRM [id number]. Topic: Clinical - Order For Equipment >> Oct 03, 2023  2:48 PM Fredrica W wrote: Reason for CRM: Pt called states [facility] needs a new Rx from provider in order to supply pull ups and pads and gloves (supplies). Thank You

## 2023-10-16 ENCOUNTER — Telehealth: Payer: Self-pay | Admitting: *Deleted

## 2023-10-16 NOTE — Telephone Encounter (Signed)
 [facility] / carol- LVM regarding total number of PCS hours this patient is receiving  thy the agency / waiting call back.

## 2023-10-20 ENCOUNTER — Telehealth: Payer: Self-pay | Admitting: *Deleted

## 2023-10-20 NOTE — Telephone Encounter (Signed)
 PATIENT IS RECEIVING 77 HOURS PER MONTH OF PCS PROVIDED BY [facility] (spoke with Lupita) [phone].

## 2023-10-23 ENCOUNTER — Encounter: Payer: MEDICAID | Admitting: Student

## 2023-10-23 NOTE — Progress Notes (Deleted)
   Established Patient Office Visit  Subjective   Patient ID: DEARIES MEIKLE, male    DOB: 1965-09-24  Age: 58 y.o. MRN: [id number]  No chief complaint on file.   HPI This is a 58 year old male with past medical history of hypertension, prediabetes, schizoaffective disorder, polysubstance use disorder, MDD, ED, BPH, urge incontinence, presents today for routine follow-up.  Last office visit 04/04/2023.  {History (Optional):23778}  ROS    Objective:     There were no vitals taken for this visit. {Vitals History (Optional):23777}  Physical Exam   No results found for any visits on 10/23/23.  Last CBC Lab Results  Component Value Date   WBC 4.0 04/04/2023   HGB 15.2 04/04/2023   HCT 45.6 04/04/2023   MCV 85 04/04/2023   MCH 28.5 04/04/2023   RDW 12.8 04/04/2023   PLT 316 04/04/2023   Last metabolic panel Lab Results  Component Value Date   GLUCOSE 74 04/04/2023   NA 139 04/04/2023   K 4.2 04/04/2023   CL 100 04/04/2023   CO2 22 04/04/2023   BUN 11 04/04/2023   CREATININE 1.05 04/04/2023   EGFR 82 04/04/2023   CALCIUM  9.1 04/04/2023   PROT 7.1 04/04/2023   ALBUMIN 4.3 04/04/2023   LABGLOB 2.8 04/04/2023   AGRATIO 1.5 11/09/2019   BILITOT 0.2 04/04/2023   ALKPHOS 80 04/04/2023   AST 39 04/04/2023   ALT 32 04/04/2023   ANIONGAP 9 11/09/2021   Last lipids Lab Results  Component Value Date   CHOL 210 (H) 08/16/2022   HDL 63 08/16/2022   LDLCALC 137 (H) 08/16/2022   TRIG 57 08/16/2022   CHOLHDL 3.3 08/16/2022   Last hemoglobin A1c Lab Results  Component Value Date   HGBA1C 5.5 08/16/2022   Last thyroid  functions Lab Results  Component Value Date   TSH 0.601 04/04/2023      The 10-year ASCVD risk score (Arnett DK, et al., 2019) is: 14.8%    Assessment & Plan:  Hypertension BP Readings from Last 3 Encounters:  04/04/23 138/85  08/16/22 (!) 138/98  11/09/21 (!) 150/82   -Patient is currently not taking any antihypertensive  medications.  Urge incontinence -Symptoms of -Requires incontinence supplies -Has previously taken Flomax  however has not found any benefit. -DME request for incontinence supplies  Pure hypercholesterolemia Last LDL 137, checked on 08/16/2022; for the last office visit, patient was started on atorvastatin  40 mg daily. -Will check lipid panel today  GERD Follows GI, - Continue pantoprazole  40 mg daily   Problem List Items Addressed This Visit   None   No follow-ups on file.    Toma Edwards, DO
